# Patient Record
Sex: Female | Born: 1935 | Race: White | Hispanic: No | Smoking: Never smoker
Health system: Southern US, Community
[De-identification: ages and names within clinical notes are randomized; demographics above are authoritative.]

## PROBLEM LIST (undated history)

## (undated) DIAGNOSIS — I4891 Unspecified atrial fibrillation: Secondary | ICD-10-CM

## (undated) DIAGNOSIS — E785 Hyperlipidemia, unspecified: Secondary | ICD-10-CM

## (undated) DIAGNOSIS — R9430 Abnormal result of cardiovascular function study, unspecified: Secondary | ICD-10-CM

## (undated) DIAGNOSIS — I10 Essential (primary) hypertension: Secondary | ICD-10-CM

## (undated) DIAGNOSIS — C541 Malignant neoplasm of endometrium: Secondary | ICD-10-CM

## (undated) DIAGNOSIS — Z789 Other specified health status: Secondary | ICD-10-CM

## (undated) DIAGNOSIS — M1990 Unspecified osteoarthritis, unspecified site: Secondary | ICD-10-CM

## (undated) DIAGNOSIS — I2510 Atherosclerotic heart disease of native coronary artery without angina pectoris: Secondary | ICD-10-CM

## (undated) DIAGNOSIS — E119 Type 2 diabetes mellitus without complications: Secondary | ICD-10-CM

## (undated) DIAGNOSIS — N289 Disorder of kidney and ureter, unspecified: Secondary | ICD-10-CM

## (undated) DIAGNOSIS — K449 Diaphragmatic hernia without obstruction or gangrene: Secondary | ICD-10-CM

## (undated) DIAGNOSIS — Z951 Presence of aortocoronary bypass graft: Secondary | ICD-10-CM

## (undated) DIAGNOSIS — E11319 Type 2 diabetes mellitus with unspecified diabetic retinopathy without macular edema: Secondary | ICD-10-CM

## (undated) DIAGNOSIS — R0602 Shortness of breath: Secondary | ICD-10-CM

## (undated) DIAGNOSIS — H35039 Hypertensive retinopathy, unspecified eye: Secondary | ICD-10-CM

## (undated) DIAGNOSIS — IMO0001 Reserved for inherently not codable concepts without codable children: Secondary | ICD-10-CM

## (undated) HISTORY — DX: Type 2 diabetes mellitus without complications: E11.9

## (undated) HISTORY — DX: Other specified health status: Z78.9

## (undated) HISTORY — PX: EYE SURGERY: SHX253

## (undated) HISTORY — DX: Malignant neoplasm of endometrium: C54.1

## (undated) HISTORY — DX: Presence of aortocoronary bypass graft: Z95.1

## (undated) HISTORY — DX: Hyperlipidemia, unspecified: E78.5

## (undated) HISTORY — PX: BREAST MASS EXCISION: SHX1267

## (undated) HISTORY — DX: Diaphragmatic hernia without obstruction or gangrene: K44.9

## (undated) HISTORY — DX: Shortness of breath: R06.02

## (undated) HISTORY — PX: TUMOR REMOVAL: SHX12

## (undated) HISTORY — DX: Hypertensive retinopathy, unspecified eye: H35.039

## (undated) HISTORY — PX: CARDIAC CATHETERIZATION: SHX172

## (undated) HISTORY — PX: CATARACT EXTRACTION: SUR2

## (undated) HISTORY — DX: Abnormal result of cardiovascular function study, unspecified: R94.30

## (undated) HISTORY — DX: Unspecified atrial fibrillation: I48.91

## (undated) HISTORY — PX: TONSILECTOMY, ADENOIDECTOMY, BILATERAL MYRINGOTOMY AND TUBES: SHX2538

## (undated) HISTORY — PX: COLONOSCOPY: SHX174

## (undated) HISTORY — DX: Type 2 diabetes mellitus with unspecified diabetic retinopathy without macular edema: E11.319

## (undated) HISTORY — DX: Atherosclerotic heart disease of native coronary artery without angina pectoris: I25.10

## (undated) HISTORY — DX: Unspecified osteoarthritis, unspecified site: M19.90

## (undated) HISTORY — PX: APPENDECTOMY: SHX54

## (undated) HISTORY — DX: Disorder of kidney and ureter, unspecified: N28.9

## (undated) HISTORY — PX: PORTACATH PLACEMENT: SHX2246

## (undated) HISTORY — DX: Essential (primary) hypertension: I10

---
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
## 1996-04-07 HISTORY — PX: OTHER SURGICAL HISTORY: SHX169

## 2004-04-07 HISTORY — PX: CATARACT EXTRACTION: SUR2

## 2008-12-15 ENCOUNTER — Encounter: Payer: Self-pay | Admitting: Cardiology

## 2008-12-16 ENCOUNTER — Encounter: Payer: Self-pay | Admitting: Cardiology

## 2008-12-16 ENCOUNTER — Ambulatory Visit: Payer: Self-pay | Admitting: Cardiology

## 2008-12-17 ENCOUNTER — Encounter: Payer: Self-pay | Admitting: Cardiology

## 2008-12-18 ENCOUNTER — Ambulatory Visit: Payer: Self-pay | Admitting: Cardiothoracic Surgery

## 2008-12-18 ENCOUNTER — Ambulatory Visit: Payer: Self-pay | Admitting: Cardiology

## 2008-12-18 ENCOUNTER — Encounter: Payer: Self-pay | Admitting: Cardiology

## 2008-12-18 ENCOUNTER — Inpatient Hospital Stay (HOSPITAL_COMMUNITY): Admission: AD | Admit: 2008-12-18 | Discharge: 2008-12-26 | Payer: Self-pay | Admitting: Cardiovascular Disease

## 2008-12-19 ENCOUNTER — Encounter: Payer: Self-pay | Admitting: Cardiothoracic Surgery

## 2008-12-21 HISTORY — PX: CORONARY ARTERY BYPASS GRAFT: SHX141

## 2008-12-22 ENCOUNTER — Encounter: Payer: Self-pay | Admitting: Cardiology

## 2008-12-26 ENCOUNTER — Encounter: Payer: Self-pay | Admitting: Cardiology

## 2008-12-28 ENCOUNTER — Encounter: Payer: Self-pay | Admitting: Cardiology

## 2009-01-02 ENCOUNTER — Ambulatory Visit: Payer: Self-pay | Admitting: Cardiology

## 2009-01-02 LAB — CONVERTED CEMR LAB: POC INR: 2.5

## 2009-01-05 ENCOUNTER — Ambulatory Visit: Payer: Self-pay | Admitting: Cardiology

## 2009-01-05 LAB — CONVERTED CEMR LAB: POC INR: 3

## 2009-01-11 ENCOUNTER — Encounter: Payer: Self-pay | Admitting: Cardiology

## 2009-01-11 ENCOUNTER — Encounter: Admission: RE | Admit: 2009-01-11 | Discharge: 2009-01-11 | Payer: Self-pay | Admitting: Cardiothoracic Surgery

## 2009-01-11 ENCOUNTER — Ambulatory Visit: Payer: Self-pay | Admitting: Cardiothoracic Surgery

## 2009-01-12 ENCOUNTER — Ambulatory Visit: Payer: Self-pay | Admitting: Cardiology

## 2009-01-12 LAB — CONVERTED CEMR LAB: POC INR: 3.1

## 2009-01-23 ENCOUNTER — Ambulatory Visit: Payer: Self-pay | Admitting: Cardiology

## 2009-01-23 DIAGNOSIS — I10 Essential (primary) hypertension: Secondary | ICD-10-CM | POA: Insufficient documentation

## 2009-01-23 DIAGNOSIS — E119 Type 2 diabetes mellitus without complications: Secondary | ICD-10-CM | POA: Insufficient documentation

## 2009-01-23 DIAGNOSIS — E785 Hyperlipidemia, unspecified: Secondary | ICD-10-CM | POA: Insufficient documentation

## 2009-01-24 ENCOUNTER — Ambulatory Visit: Payer: Self-pay | Admitting: Cardiology

## 2009-01-26 ENCOUNTER — Ambulatory Visit: Payer: Self-pay | Admitting: Cardiology

## 2009-01-30 ENCOUNTER — Ambulatory Visit: Payer: Self-pay | Admitting: Cardiology

## 2009-02-06 ENCOUNTER — Ambulatory Visit: Payer: Self-pay | Admitting: Cardiology

## 2009-02-06 LAB — CONVERTED CEMR LAB: POC INR: 1.6

## 2009-02-16 ENCOUNTER — Ambulatory Visit: Payer: Self-pay | Admitting: Cardiology

## 2009-02-16 LAB — CONVERTED CEMR LAB: POC INR: 1.8

## 2009-02-23 ENCOUNTER — Ambulatory Visit: Payer: Self-pay | Admitting: Cardiology

## 2009-03-06 ENCOUNTER — Ambulatory Visit: Payer: Self-pay | Admitting: Cardiology

## 2009-03-23 ENCOUNTER — Ambulatory Visit: Payer: Self-pay | Admitting: Cardiology

## 2009-03-23 LAB — CONVERTED CEMR LAB: POC INR: 2

## 2009-03-27 ENCOUNTER — Ambulatory Visit: Payer: Self-pay | Admitting: Cardiology

## 2009-03-27 DIAGNOSIS — R0602 Shortness of breath: Secondary | ICD-10-CM | POA: Insufficient documentation

## 2009-03-29 ENCOUNTER — Encounter: Payer: Self-pay | Admitting: Cardiology

## 2009-04-13 ENCOUNTER — Ambulatory Visit: Payer: Self-pay | Admitting: Cardiology

## 2009-05-04 ENCOUNTER — Ambulatory Visit: Payer: Self-pay | Admitting: Cardiology

## 2009-05-04 LAB — CONVERTED CEMR LAB: POC INR: 2.5

## 2009-07-05 ENCOUNTER — Ambulatory Visit: Payer: Self-pay | Admitting: Cardiology

## 2009-07-09 ENCOUNTER — Encounter: Payer: Self-pay | Admitting: Cardiology

## 2010-01-14 ENCOUNTER — Ambulatory Visit: Payer: Self-pay | Admitting: Cardiology

## 2010-05-07 NOTE — Medication Information (Signed)
Summary: ccr-lr  Anticoagulant Therapy  Managed by: Lindsay Hey, RN PCP: [name] Supervising MD: Diona Browner MD, Remi Deter Indication 1: Atrial Fibrillation Indication 2: CAD Lab Used: [facility] [place] Site: [place] INR POC 2.4  Dietary changes: no    Health status changes: no    Bleeding/hemorrhagic complications: no    Recent/future hospitalizations: no    Any changes in medication regimen? no    Recent/future dental: no  Any missed doses?: no       Is patient compliant with meds? yes       Allergies: 1)  ! * Statins  Anticoagulation Management History:      The patient is taking warfarin and comes in today for a routine follow up visit.  Warfarin therapy is being given due to Atrial Fibrillation.  Positive risk factors for bleeding include an age of 2 years or older and presence of serious comorbidities.  Negative risk factors for bleeding include no history of CVA/TIA and no history of GI bleeding.  The bleeding index is 'intermediate risk'.  Positive CHADS2 values include History of HTN, Age > 75 years old, and History of Diabetes.  Negative CHADS2 values include History of CHF and Prior Stroke/CVA/TIA.  The start date was 12/19/2008.  Anticoagulation responsible provider: Diona Browner MD, Remi Deter.  INR POC: 2.4.  Cuvette Lot#: [id number].  Exp: 10/11.    Anticoagulation Management Assessment/Plan:      The patient's current anticoagulation dose is Warfarin sodium 5 mg tabs: take as directed per coumadin clinic.  The target INR is 2.0-3.0.  The next INR is due 05/11/2009.  Anticoagulation instructions were given to patient.  Results were reviewed/authorized by Lindsay Hey, RN.         Prior Anticoagulation Instructions: INR 2.0 Increase coumadin to 7.5mg  once daily except 10mg  on Fridays  Current Anticoagulation Instructions: INR 2.4 Continue coumadin 1 1/2 tablets once daily except 2 tablets on Fridays

## 2010-05-07 NOTE — Assessment & Plan Note (Signed)
Summary: 8WK F/U [place]   Visit Type:  Follow-up Primary Provider:  Dr. Regina Eck  CC:  fluid overload.  History of Present Illness:  The patient is seen back for followup of coronary artery disease, fluid overload, paroxysmal atrial fibrillation.  She is doing very well.  She had some fluid overload and we can push her diuretic dose to twice daily.  Labs were checked and her creatinine had gone up somewhat.  Based on this I cut her diuretic dose back to once daily.  She is doing well with this.  She takes an extra dose of diuretic on a rare basis if she has some swelling.  She has not had any chest pain or shortness of breath.  Preventive Screening-Counseling & Management  Alcohol-Tobacco     Smoking Status: never  Current Medications (verified): 1)  Norvasc 5 Mg Tabs (Amlodipine Besylate) .... Take 1 Tablet By Mouth Once A Day 2)  Aspirin 81 Mg Tbec (Aspirin) .... Take One Tablet By Mouth Daily 3)  Metoprolol Tartrate 25 Mg Tabs (Metoprolol Tartrate) .... 1/2 Tab Two Times A Day 4)  Calcium 500 Mg Tabs (Calcium Carbonate) .... Take 1 Tablet By Mouth Two Times A Day 5)  Glucophage 500 Mg Tabs (Metformin Hcl) .... Take 1 Tab By Mouth At Bedtime 6)  Glipizide 5 Mg Tabs (Glipizide) .... Take 1 Tablet By Mouth Once A Day 7)  Vitamin D 400 Unit  Tabs (Cholecalciferol) .... Take 1 Tablet By Mouth Once A Day 8)  Fish Oil 1000 Mg Caps (Omega-3 Fatty Acids) .... Take 1 Tablet By Mouth Once A Day 9)  Potassium Chloride Crys Cr 20 Meq Cr-Tabs (Potassium Chloride Crys Cr) .... Take One Tablet By Mouth Daily 10)  Furosemide 40 Mg Tabs (Furosemide) .... Take 1 Tablet By Mouth Once A Day 11)  Glucosamine 500 Mg Caps (Glucosamine Sulfate) .... Take 1 Tablet By Mouth Two Times A Day 12)  Red Yeast Rice Extract 600 Mg Caps (Red Yeast Rice Extract) .... 2 Tabs At Bedtime 13)  Flaxseed Oil 1200 Mg Caps (Flaxseed (Linseed)) .... Take 1 Tablet By Mouth Once A Day  Allergies: 1)  ! *  Statins  Comments:  Nurse/Medical Assistant: The patient's medications were reviewed with the patient and were updated in the Medication List. Pt brought a list of medications to office visit.  Cyril Loosen, RN, BSN (July 05, 2009 12:17 PM)  Past History:  Past Medical History: CAD CABG... December 21, 2008.Marland KitchenMarland KitchenDonata Clay.Marland Kitchen LIMA LAD, SVG posterior descending... SVG OM... Maze procedure.. ligation left atrial appendage  /  Coumadin stopped May 04, 2009 EF... 60%.. [facility]  [facility].... /  ???40% at a later date.. need to review???? LVH.. echo.. September, 2010 Atrial fibrillation... Maze procedure.. September, 2010...amiodarone and Coumadin for some period of time. /  Coumadin stopped May 04, 2009 Hypertension Hyperlipidemia Non-Insulin-dependent diabetes Severe Osteoarthritis Cholecystectomy Fluid overload Renal insufficiency.... mild.... with higher dose diuretic  Review of Systems       Patient denies fever, chills, headache, sweats, rash, change in vision, change in hearing, chest pain, cough, nausea vomiting, urinary symptoms, musculoskeletal problems.  All other systems are reviewed and are negative.  Vital Signs:  Patient profile:   77 year old female Height:      65 inches Weight:      178 pounds Pulse rate:   71 / minute BP sitting:   139 / 82  (left arm) Cuff size:   large  Vitals Entered By:  Cyril Loosen, RN, BSN (July 05, 2009 12:14 PM) CC: fluid overload Comments follow up visit   Physical Exam  General:  patient is quite stable. Head:  head is atraumatic. Eyes:  no xanthelasma. Neck:  no jugular venous distention. Chest Wall:  no chest wall tenderness. Lungs:  lungs are clear respiratory effort is nonlabored. Heart:  cardiac exam reveals S1-S2.  No clicks or significant murmurs. Abdomen:  abdomen is soft. Msk:  no musculoskeletal deformities. Extremities:  no peripheral edema. Skin:  no skin rashes. Psych:  patient is  oriented to person time and place.  Affect is normal.   Impression & Recommendations:  Problem # 1:  * FLUID OVERLOAD The patient's fluid status is now stable.  She required a higher dose of diuretic for a period of time and then we cut this back.  She is stable.  Problem # 2:  RENAL INSUFFICIENCY (ICD-588.9) The patient had mild increase in her creatinine was higher dose diuretics.  The diuretic dose was cut back. We will arrange for chemistry to be checked to be sure her renal function has returned to baseline.  Problem # 3:  EDEMA (ICD-782.3) She has no edema at this time.  Problem # 4:  * MAZE PROCEDURE The patient had a Maze procedure at the time of her surgery.  EKG is done today and reviewed by me.  She is normal sinus rhythm.  Problem # 5:  CAD (ICD-414.00)  Her updated medication list for this problem includes:    Norvasc 5 Mg Tabs (Amlodipine besylate) .Marland Kitchen... Take 1 tablet by mouth once a day    Aspirin 81 Mg Tbec (Aspirin) .Marland Kitchen... Take one tablet by mouth daily    Metoprolol Tartrate 25 Mg Tabs (Metoprolol tartrate) .Marland Kitchen... 1/2 tab two times a day Coronary disease is stable.  No further workup is needed at this time.  Problem # 6:  HYPERTENSION (ICD-401.9)  Her updated medication list for this problem includes:    Norvasc 5 Mg Tabs (Amlodipine besylate) .Marland Kitchen... Take 1 tablet by mouth once a day    Aspirin 81 Mg Tbec (Aspirin) .Marland Kitchen... Take one tablet by mouth daily    Metoprolol Tartrate 25 Mg Tabs (Metoprolol tartrate) .Marland Kitchen... 1/2 tab two times a day    Furosemide 40 Mg Tabs (Furosemide) .Marland Kitchen... Take 1 tablet by mouth once a day Blood pressure is under good control.  No change in therapy.  Other Orders: EKG w/ Interpretation (93000)  Patient Instructions: 1)  Your physician wants you to follow-up in: 6 months. You will receive a reminder letter in the mail one-two months in advance. If you don't receive a letter, please call our office to schedule the follow-up appointment. 2)   Your physician recommends that you continue on your current medications as directed. Please refer to the Current Medication list given to you today.  Appended Document: Orders Update    Clinical Lists Changes  Orders: Added new Test order of T-Basic Metabolic Panel [phone]) - Signed

## 2010-05-07 NOTE — Medication Information (Signed)
Summary: ccr-lr  Anticoagulant Therapy  Managed by: Inactive PCP: Dr. Regina Eck Supervising MD: Myrtis Ser MD, Tinnie Gens Indication 1: Atrial Fibrillation Indication 2: CAD Lab Used: [facility] [place] Site: [place] INR POC 2.5  Dietary changes: no    Health status changes: no    Bleeding/hemorrhagic complications: yes       Details: has developed vaginal bleeding  Recent/future hospitalizations: no    Any changes in medication regimen? no    Recent/future dental: no  Any missed doses?: no       Is patient compliant with meds? yes      Comments: Pt saw Dr Myrtis Ser today.  He stopped her coumadin and started her on ASA.  Allergies: 1)  ! * Statins  Anticoagulation Management History:      The patient is taking warfarin and comes in today for a routine follow up visit.  Anticoagulation is being administered due to Atrial Fibrillation.  Positive risk factors for bleeding include an age of 14 years or older and presence of serious comorbidities.  Negative risk factors for bleeding include no history of CVA/TIA and no history of GI bleeding.  The bleeding index is 'intermediate risk'.  Positive CHADS2 values include History of HTN, Age > 27 years old, and History of Diabetes.  Negative CHADS2 values include History of CHF and Prior Stroke/CVA/TIA.  The start date was 12/19/2008.  Anticoagulation responsible provider: Myrtis Ser MD, Tinnie Gens.  INR POC: 2.5.  Cuvette Lot#: [id number].  Exp: 10/11.    Anticoagulation Management Assessment/Plan:      The patient's current anticoagulation dose is Warfarin sodium 5 mg tabs: take as directed per coumadin clinic.  The target INR is 2.0-3.0.  The next INR is due 05/11/2009.  Anticoagulation instructions were given to patient.  Results were reviewed/authorized by Inactive.  She was notified by Vashti Hey RN.         Prior Anticoagulation Instructions: INR 2.4 Continue coumadin 1 1/2 tablets once daily except 2 tablets on Fridays  Current  Anticoagulation Instructions: INR 2.5 Coumadin discontinued per Dr Myrtis Ser

## 2010-05-07 NOTE — Assessment & Plan Note (Signed)
Summary: 6 mo fu   Visit Type:  Follow-up Primary Nassim Cosma:  Dr. Regina Eck  CC:  CAD.  History of Present Illness: Patient is seen for followup of coronary artery disease.  I saw her last July 05, 2009.  She is doing well.  Her fluid status has been stable.  She is not having any chest pain or shortness of breath.  He is going about full activities.  Preventive Screening-Counseling & Management  Alcohol-Tobacco     Smoking Status: never  Current Medications (verified): 1)  Norvasc 5 Mg Tabs (Amlodipine Besylate) .... Take 1 Tablet By Mouth Once A Day 2)  Aspirin 81 Mg Tbec (Aspirin) .... Take One Tablet By Mouth Daily 3)  Metoprolol Tartrate 25 Mg Tabs (Metoprolol Tartrate) .... 1/2 Tab Two Times A Day 4)  Calcium 500 Mg Tabs (Calcium Carbonate) .... Take 1 Tablet By Mouth Two Times A Day 5)  Glucophage 500 Mg Tabs (Metformin Hcl) .... Take 1 Tab By Mouth At Bedtime 6)  Glipizide 5 Mg Tabs (Glipizide) .... Take 1 Tablet By Mouth Once A Day 7)  Vitamin D 400 Unit  Tabs (Cholecalciferol) .... Take 1 Tablet By Mouth Once A Day 8)  Fish Oil 1000 Mg Caps (Omega-3 Fatty Acids) .... Take 1 Tablet By Mouth Once A Day 9)  Potassium Chloride Crys Cr 20 Meq Cr-Tabs (Potassium Chloride Crys Cr) .... Take One Tablet By Mouth Daily 10)  Furosemide 40 Mg Tabs (Furosemide) .... Take 1 Tablet By Mouth Once A Day 11)  Glucosamine 500 Mg Caps (Glucosamine Sulfate) .... Take 1 Tablet By Mouth Two Times A Day 12)  Red Yeast Rice Extract 600 Mg Caps (Red Yeast Rice Extract) .... 2 Tabs At Bedtime 13)  Flaxseed Oil 1200 Mg Caps (Flaxseed (Linseed)) .... Take 1 Tablet By Mouth Once A Day  Allergies (verified): 1)  ! * Statins  Comments:  Nurse/Medical Assistant: The patient's medication list and allergies were reviewed with the patient and were updated in the Medication and Allergy Lists.  Past History:  Past Medical History: CAD CABG... December 21, 2008.Marland KitchenMarland KitchenDonata Clay.Marland Kitchen LIMA LAD, SVG  posterior descending... SVG OM... Maze procedure.. ligation left atrial appendage  /  Coumadin stopped May 04, 2009 EF... 60%.. [facility]  December 18, 2008.... /  ???40% at a later date.. need to review???? LVH.. echo.. September, 2010 Atrial fibrillation... Maze procedure.. September, 2010...amiodarone and Coumadin for some period of time. /  Coumadin stopped May 04, 2009 Hypertension Hyperlipidemia Non-Insulin-dependent diabetes Severe Osteoarthritis Cholecystectomy Fluid overload Renal insufficiency.... mild.... with higher dose diuretic  Review of Systems       Patient denies fever, chills, headache, sweats, rash, change in vision, change in hearing, chest pain, cough, nausea vomiting, urinary symptoms.  All of the systems are reviewed and are negative  Vital Signs:  Patient profile:   77 year old female Height:      65 inches Weight:      179 pounds BMI:     29.89 Pulse rate:   71 / minute BP sitting:   145 / 83  (left arm) Cuff size:   large  Vitals Entered By: Carlye Grippe (January 14, 2010 10:15 AM)  Nutrition Counseling: Patient's BMI is greater than 25 and therefore counseled on weight management options.  Physical Exam  General:  patient is stable today. Eyes:  no xanthelasma. Neck:  no jugular venous distention Lungs:  lungs are clear.  Respiratory effort is not labored. Heart:  cardiac exam reveals an  S1-S2.  No clicks significant murmurs. Abdomen:  abdomen is soft. Extremities:  no peripheral edema. Psych:  patient is oriented to person time and place.  Affect is normal.   Impression & Recommendations:  Problem # 1:  * FLUID OVERLOAD Volume status stable.  No change in therapy.  Problem # 2:  RENAL INSUFFICIENCY (ICD-588.9) We checked a chemistry lab after her last visit.  BUN was 22 and creatinine 0.93.  No further workup needed  Problem # 3:  SHORTNESS OF BREATH (ICD-786.05)  Her updated medication list for this problem includes:     Norvasc 5 Mg Tabs (Amlodipine besylate) .Marland Kitchen... Take 1 tablet by mouth once a day    Aspirin 81 Mg Tbec (Aspirin) .Marland Kitchen... Take one tablet by mouth daily    Metoprolol Tartrate 25 Mg Tabs (Metoprolol tartrate) .Marland Kitchen... 1/2 tab two times a day    Furosemide 40 Mg Tabs (Furosemide) .Marland Kitchen... Take 1 tablet by mouth once a day She is not having any shortness of breath.  Problem # 4:  CAD (ICD-414.00)  Her updated medication list for this problem includes:    Norvasc 5 Mg Tabs (Amlodipine besylate) .Marland Kitchen... Take 1 tablet by mouth once a day    Aspirin 81 Mg Tbec (Aspirin) .Marland Kitchen... Take one tablet by mouth daily    Metoprolol Tartrate 25 Mg Tabs (Metoprolol tartrate) .Marland Kitchen... 1/2 tab two times a day Coronary disease is stable.  No change in therapy.  EKG is done today and reviewed by me.  There is normal sinus rhythm.  No significant abnormalities.  Problem # 5:  ATRIAL FIBRILLATION (ICD-427.31)  Her updated medication list for this problem includes:    Aspirin 81 Mg Tbec (Aspirin) .Marland Kitchen... Take one tablet by mouth daily    Metoprolol Tartrate 25 Mg Tabs (Metoprolol tartrate) .Marland Kitchen... 1/2 tab two times a day  Orders: EKG w/ Interpretation (93000) EKG reveals normal sinus rhythm after her maze procedure.  No further workup.  Patient Instructions: 1)  Your physician wants you to follow-up in: 1 year. You will receive a reminder letter in the mail one-two months in advance. If you don't receive a letter, please call our office to schedule the follow-up appointment. 2)  Your physician recommends that you continue on your current medications as directed. Please refer to the Current Medication list given to you today.

## 2010-05-07 NOTE — Assessment & Plan Note (Signed)
Summary: 3-4 WK FU -SRS   Visit Type:  Follow-up Primary Provider:  Dr. Regina Eck  CC:  CAD /  atrial fibrillation.  History of Present Illness: The patient is seen for followup of coronary artery disease and atrial fibrillation.  She is doing very well.  We have had her on aspirin and Coumadin for both coronary disease and atrial fibrillation.  She's had some vaginal spotting.  I have reviewed all of the data.  The patient had a Maze procedure.  Her atrial fibrillation had only been seen just prior to her need for CABG.  She does not have palpitations.  She is held sinus rhythm since June, 2010.  Her left atrial appendage was ligated.  Therefore it is safe for her to stop her Coumadin and this will be done.  Preventive Screening-Counseling & Management  Alcohol-Tobacco     Smoking Status: never  Current Medications (verified): 1)  Norvasc 5 Mg Tabs (Amlodipine Besylate) .... Take 1 Tablet By Mouth Once A Day 2)  Aspirin 81 Mg Tbec (Aspirin) .... Take One Tablet By Mouth Daily 3)  Metoprolol Tartrate 25 Mg Tabs (Metoprolol Tartrate) .... 1/2 Tab Two Times A Day 4)  Calcium 500 Mg Tabs (Calcium Carbonate) .... Take 1 Tablet By Mouth Two Times A Day 5)  Glucophage 500 Mg Tabs (Metformin Hcl) .... Take 1 Tab By Mouth At Bedtime 6)  Glipizide 5 Mg Tabs (Glipizide) .... Take 1 Tablet By Mouth Once A Day 7)  Vitamin D 400 Unit  Tabs (Cholecalciferol) .... Take 1 Tablet By Mouth Once A Day 8)  Fish Oil 1000 Mg Caps (Omega-3 Fatty Acids) .... Take 1 Tablet By Mouth Once A Day 9)  Potassium Chloride Crys Cr 20 Meq Cr-Tabs (Potassium Chloride Crys Cr) .... Take One Tablet By Mouth Daily 10)  Furosemide 40 Mg Tabs (Furosemide) .... Take 1 Tablet By Mouth Two Times A Day 11)  Glucosamine 500 Mg Caps (Glucosamine Sulfate) .... Take 1 Tablet By Mouth Two Times A Day 12)  Red Yeast Rice Extract 600 Mg Caps (Red Yeast Rice Extract) .... 2 Tabs At Bedtime 13)  Flaxseed Oil 1200 Mg Caps (Flaxseed  (Linseed)) .... Take 1 Tablet By Mouth Once A Day  Allergies (verified): 1)  ! * Statins  Comments:  Nurse/Medical Assistant: The patient's medications and allergies were reviewed with the patient and were updated in the Medication and Allergy Lists. List reviewed.  Past History:  Past Medical History: CAD CABG... December 21, 2008.Marland KitchenMarland KitchenDonata Clay.Marland Kitchen LIMA LAD, SVG posterior descending... SVG OM... Maze procedure.. ligation left atrial appendage  /  Coumadin [facility] May 04, 2009 EF... 60[facility].. [facility]  [facility].... /  ???40% at a later date.. need to review???? LVH.. echo.. September, 2010 Atrial fibrillation... Maze procedure.. September, 2010...amiodarone and Coumadin for some period of time. /  Coumadin stopped May 04, 2009 Hypertension Hyperlipidemia Non-Insulin-dependent diabetes Severe Osteoarthritis Cholecystectomy  Review of Systems       Patient denies fever, chills, headache, sweats, rash, change in vision, change in hearing, chest pain, shortness of breath, nausea vomiting, urinary symptoms.  All the systems are reviewed and are negative.  Vital Signs:  Patient profile:   77 year old female Height:      65 inches Weight:      181 pounds Pulse rate:   72 / minute BP sitting:   130 / 62  (left arm) Cuff size:   large  Vitals Entered By: Carlye Grippe (May 04, 2009 2:26  PM) CC: CAD /  atrial fibrillation   Physical Exam  General:  patient is quite stable in general. Head:  head is atraumatic. Eyes:  no xanthelasma. Neck:  no jugular venous distention. Chest Wall:  no chest wall tenderness. Lungs:  lungs are clear.  Respiratory effort is nonlabored. Heart:  cardiac exam reveals S1 and S2.  There is a very soft systolic murmur. Abdomen:  abdomen is soft. Msk:  no musculoskeletal deformities. Extremities:  no peripheral edema. Skin:  no skin rashes. Psych:  patient is oriented to person time and place.  Affect is normal.   Impression &  Recommendations:  Problem # 1:  SHORTNESS OF BREATH (ICD-786.05)  Her updated medication list for this problem includes:    Norvasc 5 Mg Tabs (Amlodipine besylate) .Marland Kitchen... Take 1 tablet by mouth once a day    Aspirin 81 Mg Tbec (Aspirin) .Marland Kitchen... Take one tablet by mouth daily    Metoprolol Tartrate 25 Mg Tabs (Metoprolol tartrate) .Marland Kitchen... 1/2 tab two times a day    Furosemide 40 Mg Tabs (Furosemide) .Marland Kitchen... Take 1 tablet by mouth two times a day  Patient shortness of breath and edema are improved.  She is on a higher dose of Lasix since her last visit.  We will recheck chemistry today be sure that this is stable.  If so she'll be kept on his dose of diuretic.  I will see her back in 8 weeks for followup.  Problem # 2:  * MAZE PROCEDURE The patient had a Maze procedure.  She is holding sinus rhythm.  She had ligation of her left atrial appendage.  These are all the reasons why her Coumadin can now be stopped.  In addition she needs to remain on aspirin for her coronary disease.  In addition she's having some vaginal spotting.  Therefore Coumadin is to be stopped.  I'll see her for early followup to be sure that she is holding sinus rhythm.  EKG is done today reviewed by me.  There is normal sinus rhythm.  Problem # 3:  CAD (ICD-414.00)  The following medications were removed from the medication list:    Warfarin Sodium 5 Mg Tabs (Warfarin sodium) .Marland Kitchen... Take as directed per coumadin clinic Her updated medication list for this problem includes:    Norvasc 5 Mg Tabs (Amlodipine besylate) .Marland Kitchen... Take 1 tablet by mouth once a day    Aspirin 81 Mg Tbec (Aspirin) .Marland Kitchen... Take one tablet by mouth daily    Metoprolol Tartrate 25 Mg Tabs (Metoprolol tartrate) .Marland Kitchen... 1/2 tab two times a day Coronary disease is stable.  She will continue her aspirin.  Problem # 4:  HYPERTENSION (ICD-401.9)  Her updated medication list for this problem includes:    Norvasc 5 Mg Tabs (Amlodipine besylate) .Marland Kitchen... Take 1 tablet by  mouth once a day    Aspirin 81 Mg Tbec (Aspirin) .Marland Kitchen... Take one tablet by mouth daily    Metoprolol Tartrate 25 Mg Tabs (Metoprolol tartrate) .Marland Kitchen... 1/2 tab two times a day    Furosemide 40 Mg Tabs (Furosemide) .Marland Kitchen... Take 1 tablet by mouth two times a day Blood pressure is well controlled today.  No change in therapy.  Orders: T-Basic Metabolic Panel [phone])  Other Orders: EKG w/ Interpretation (93000)  Patient Instructions: 1)  Your physician has recommended you make the following change in your medication: STOP COUMADIN, REMAIN ON ASPIRIN. 2)  Your physician recommends that you return for lab work [id number] [facility] AT [facility].  3)  Your physician recommends that you schedule a follow-up appointment in: 07/05/09 @12 :30PM with Dr. Myrtis Ser.

## 2010-06-06 HISTORY — PX: COLONOSCOPY: SHX174

## 2010-06-20 ENCOUNTER — Ambulatory Visit: Payer: Medicare Other | Attending: Gynecologic Oncology | Admitting: Gynecologic Oncology

## 2010-06-20 DIAGNOSIS — I10 Essential (primary) hypertension: Secondary | ICD-10-CM | POA: Insufficient documentation

## 2010-06-20 DIAGNOSIS — E119 Type 2 diabetes mellitus without complications: Secondary | ICD-10-CM | POA: Insufficient documentation

## 2010-06-20 DIAGNOSIS — Z79899 Other long term (current) drug therapy: Secondary | ICD-10-CM | POA: Insufficient documentation

## 2010-06-20 DIAGNOSIS — C549 Malignant neoplasm of corpus uteri, unspecified: Secondary | ICD-10-CM | POA: Insufficient documentation

## 2010-06-20 DIAGNOSIS — I252 Old myocardial infarction: Secondary | ICD-10-CM | POA: Insufficient documentation

## 2010-06-28 NOTE — Consult Note (Addendum)
[name], [name]              ACCOUNT NO.:  [id number]  MEDICAL RECORD NO.:  [id number]           PATIENT TYPE:  LOCATION:                                 FACILITY:  PHYSICIAN:  Laurette Schimke, MD          DATE OF BIRTH:  DATE OF CONSULTATION:  06/20/2010 DATE OF DISCHARGE:                                CONSULTATION   REASON FOR CONSULTATION:  Consult was requested by Dr. Elza Rafter for management of endometrial cancer.  HISTORY OF PRESENT ILLNESS:  This is a 77 year old gravida 1, para 1, last normal menstrual period in the late 1940s.  She noted onset of rectal bleeding approximately 1 month ago.  The bleeding increased in intensity and she presented to the emergency room.  She was referred for colonoscopy and the colonoscopy was not feasible because of evidence of acute diverticulitis and as such she was treated with a 14-day course of antibiotics.  A CT scan of the abdomen and pelvis was obtained on June 06, 2010, and this confirmed the presence of diverticulitis.  In addition, irregular thickening was appreciated within the fundus of the uterus and the patient was referred for additional evaluation.  An endometrial biopsy and a pelvic ultrasound was performed and was notable for uterus, measuring 7.9 cm in greatest dimension.  The endometrial lining was noted to be thickened with internal color flow and measured 1.6 cm.  The adnexa were within normal limits.  As such, she underwent endometrial biopsy, which returned evidence of a grade 2 endometrial adenocarcinoma.  [name] denies any vaginal bleeding.  She states that she has a great appetite.  She has lost 10 pounds because of intentional dietary changes.  She denies diarrhea or constipation.  She denies pelvic pain, cramping, or changes in urinary habits.  PAST MEDICAL HISTORY: 1. Non-insulin dependent diabetes for 10 years' duration. 2. Myocardial infarction in September 2010. 3. Hypertension for 20  years.  PAST SURGICAL HISTORY:  Three vessel bypass, cardiac bypass in September 2010, appendectomy 6 years ago, laparoscopic cholecystectomy in 1998, non-thyroid related mass removed from the neck in 1982, and left benign breast mass removed in 1984.  MEDICATIONS: 1. Norvasc 5 mg daily. 2. Aspirin 81 mg daily. 3. Metoprolol 25 mg half tablet twice daily. 4. Calcium 500 mg twice daily. 5. Glucophage 500 mg twice daily. 6. Glipizide 5 mg twice daily. 7. Vitamin D 400 units daily. 8. Fish oil 1000 mg daily. 9. Potassium chloride 20 mEq daily. 10.Furosemide 40 mg daily. 11.Glucosamine sulfate 500 mg twice daily. 12.Red yeast rice extract 600 mg twice daily. 13.Flaxseed oil 1 tablet daily.  ALLERGIES:  STATINS, which cause achiness and agitation.  SOCIAL HISTORY:  The patient has been widowed for 3 years.  She denies tobacco or alcohol use.  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  FAMILY HISTORY:  Mother diagnosed with breast cancer at age of 39 and a father with metastatic cancer of unknown primary.  REVIEW OF SYSTEMS:  Ten-point review of systems is negative except asnoted above in the history and physical.  PHYSICAL EXAMINATION:  GENERAL:  A well-developed, very pleasant female in no acute distress. VITAL SIGNS:  Height 5 feet 5 inches, weight 166 pounds, blood pressure 152/70, pulse 76. CHEST:  Clear to auscultation. HEART:  Regular rate and rhythm. ABDOMEN:  Soft, obese.  Mid-sternotomy incision and appendectomy incisions appreciated. PELVIC:  Normal external genitalia, Bartholin, urethral, and Skene .  4 cm cervix, soft without any lesions.  Uterus is mobile.  No nodularity noted within the cul-de-sac.  No lesions visible in the vagina. RECTAL:  Good anal sphincter tone without any masses.  IMPRESSION:  Grade 2 endometrial carcinoma.  An ultrasound demonstrates the uterus is approximately 8 cm.  A CT scan of the abdomen and pelvis is unremarkable for  retroperitoneal adenopathy.  The patient was given the option between laparotomy versus robotic-assisted laparoscopic hysterectomy, bilateral salpingo-oophorectomy, bilateral pelvic lymph node dissection with possible periaortic lymph node dissection.  The patient opted for the noninvasive route.  She is aware that there is a possibility of conversion.  The risks and benefits of the procedure were discussed with her in the presence of her sister.  All of their questions were answered to their satisfaction.  [name] is aware that the procedure will be by Dr. Rockney Ghee on November 28, 2009.  Prior to completion of this procedure, cardiac clearance will be obtained from Dr. Willa Rough.     Laurette Schimke, MD     WB/MEDQ  D:  06/20/2010  T:  06/21/2010  Job:  [id number]  cc:   Luis Abed, MD, [facility] [address]  [address] [place] [address]  [name] Fax: [phone]  Dr. Elza Rafter  Electronically Signed by Laurette Schimke MD on 06/24/2010 10:32:48 AM

## 2010-07-07 HISTORY — PX: ABDOMINAL HYSTERECTOMY: SHX81

## 2010-07-08 ENCOUNTER — Telehealth: Payer: Self-pay | Admitting: *Deleted

## 2010-07-08 NOTE — Telephone Encounter (Signed)
Received a call from nurse with [facility] regarding pt. She states pt is having surgery on April 24th with Dr. Cleda Mccreedy. This will be general surgery w/general anes. Pt is having robotic assisted hysterectomy/BSO, bilateral pelvic lymph node dissection w/possible periaortic lymph node dissection d/t endometrial cancer. She is asking for surgical clearance for this patient. She states they do not have a surgical clearance form to fax. She states their phone number is [phone] and fax number is [phone]. Consultation note from Dr. Nelly Rout is available in E-chart and has been printed for Dr. Henrietta Hoover review. Nurse is aware we will ask Dr. Myrtis Ser to review when he is in [place] on 4/3.

## 2010-07-09 NOTE — Telephone Encounter (Signed)
Patient is cleared from the cardiac viewpoint for GYN surgery

## 2010-07-09 NOTE — Telephone Encounter (Signed)
[facility].

## 2010-07-12 LAB — CROSSMATCH
ABO/RH(D): O POS
Antibody Screen: NEGATIVE

## 2010-07-12 LAB — BASIC METABOLIC PANEL
BUN: 14 mg/dL (ref 6–23)
BUN: 17 mg/dL (ref 6–23)
BUN: 9 mg/dL (ref 6–23)
BUN: 14 mg/dL (ref 6–23)
BUN: 16 mg/dL (ref 6–23)
BUN: 17 mg/dL (ref 6–23)
CO2: 24 mEq/L (ref 19–32)
CO2: 24 mEq/L (ref 19–32)
CO2: 25 mEq/L (ref 19–32)
CO2: 23 mEq/L (ref 19–32)
CO2: 26 mEq/L (ref 19–32)
Calcium: 7.9 mg/dL — ABNORMAL LOW (ref 8.4–10.5)
Calcium: 8.4 mg/dL (ref 8.4–10.5)
Calcium: 8.8 mg/dL (ref 8.4–10.5)
Calcium: 8.7 mg/dL (ref 8.4–10.5)
Calcium: 8.8 mg/dL (ref 8.4–10.5)
Chloride: 101 mEq/L (ref 96–112)
Chloride: 102 mEq/L (ref 96–112)
Chloride: 105 mEq/L (ref 96–112)
Chloride: 103 mEq/L (ref 96–112)
Chloride: 91 mEq/L — ABNORMAL LOW (ref 96–112)
Chloride: 91 mEq/L — ABNORMAL LOW (ref 96–112)
Creatinine, Ser: 0.93 mg/dL (ref 0.4–1.2)
Creatinine, Ser: 1.11 mg/dL (ref 0.4–1.2)
Creatinine, Ser: 0.88 mg/dL (ref 0.4–1.2)
Creatinine, Ser: 0.97 mg/dL (ref 0.4–1.2)
Creatinine, Ser: 1.04 mg/dL (ref 0.4–1.2)
GFR calc Af Amer: 58 mL/min — ABNORMAL LOW (ref >60)
GFR calc Af Amer: >60 mL/min (ref >60)
GFR calc Af Amer: >60 mL/min (ref >60)
GFR calc Af Amer: >60 mL/min (ref >60)
GFR calc Af Amer: >60 mL/min (ref >60)
GFR calc non Af Amer: 48 mL/min — ABNORMAL LOW (ref >60)
GFR calc non Af Amer: 59 mL/min — ABNORMAL LOW (ref >60)
GFR calc non Af Amer: >60 mL/min (ref >60)
GFR calc non Af Amer: >60 mL/min (ref >60)
Glucose, Bld: 131 mg/dL — ABNORMAL HIGH (ref 70–99)
Glucose, Bld: 151 mg/dL — ABNORMAL HIGH (ref 70–99)
Glucose, Bld: 132 mg/dL — ABNORMAL HIGH (ref 70–99)
Glucose, Bld: 158 mg/dL — ABNORMAL HIGH (ref 70–99)
Glucose, Bld: 171 mg/dL — ABNORMAL HIGH (ref 70–99)
Potassium: 3.8 mEq/L (ref 3.5–5.1)
Potassium: 4 mEq/L (ref 3.5–5.1)
Potassium: 3.4 mEq/L — ABNORMAL LOW (ref 3.5–5.1)
Potassium: 4.2 mEq/L (ref 3.5–5.1)
Sodium: 132 mEq/L — ABNORMAL LOW (ref 135–145)
Sodium: 137 mEq/L (ref 135–145)
Sodium: 134 mEq/L — ABNORMAL LOW (ref 135–145)

## 2010-07-12 LAB — POCT I-STAT 4, (NA,K, GLUC, HGB,HCT)
Glucose, Bld: 114 mg/dL — ABNORMAL HIGH (ref 70–99)
Glucose, Bld: 142 mg/dL — ABNORMAL HIGH (ref 70–99)
Glucose, Bld: 153 mg/dL — ABNORMAL HIGH (ref 70–99)
HCT: 20 % — ABNORMAL LOW (ref 36.0–46.0)
HCT: 25 % — ABNORMAL LOW (ref 36.0–46.0)
HCT: 26 % — ABNORMAL LOW (ref 36.0–46.0)
HCT: 37 % (ref 36.0–46.0)
Hemoglobin: 12.6 g/dL (ref 12.0–15.0)
Hemoglobin: 6.8 g/dL — CL (ref 12.0–15.0)
Potassium: 3.7 mEq/L (ref 3.5–5.1)
Potassium: 4.3 mEq/L (ref 3.5–5.1)
Sodium: 137 mEq/L (ref 135–145)

## 2010-07-12 LAB — CBC
HCT: 31.3 % — ABNORMAL LOW (ref 36.0–46.0)
HCT: 35.1 % — ABNORMAL LOW (ref 36.0–46.0)
HCT: 35.8 % — ABNORMAL LOW (ref 36.0–46.0)
HCT: 38.5 % (ref 36.0–46.0)
HCT: 38.8 % (ref 36.0–46.0)
HCT: 40.3 % (ref 36.0–46.0)
Hemoglobin: 10.8 g/dL — ABNORMAL LOW (ref 12.0–15.0)
Hemoglobin: 12 g/dL (ref 12.0–15.0)
Hemoglobin: 12.3 g/dL (ref 12.0–15.0)
Hemoglobin: 13.1 g/dL (ref 12.0–15.0)
Hemoglobin: 13.2 g/dL (ref 12.0–15.0)
Hemoglobin: 13.2 g/dL (ref 12.0–15.0)
Hemoglobin: 13.9 g/dL (ref 12.0–15.0)
Hemoglobin: 14.3 g/dL (ref 12.0–15.0)
MCHC: 34 g/dL (ref 30.0–36.0)
MCHC: 34.1 g/dL (ref 30.0–36.0)
MCHC: 34.2 g/dL (ref 30.0–36.0)
MCHC: 34.2 g/dL (ref 30.0–36.0)
MCHC: 34.4 g/dL (ref 30.0–36.0)
MCHC: 34.4 g/dL (ref 30.0–36.0)
MCHC: 34.4 g/dL (ref 30.0–36.0)
MCHC: 34.6 g/dL (ref 30.0–36.0)
MCV: 91.5 fL (ref 78.0–100.0)
MCV: 91.6 fL (ref 78.0–100.0)
MCV: 91.8 fL (ref 78.0–100.0)
MCV: 91.9 fL (ref 78.0–100.0)
MCV: 92.1 fL (ref 78.0–100.0)
MCV: 92.8 fL (ref 78.0–100.0)
MCV: 93.6 fL (ref 78.0–100.0)
Platelets: 113 10*3/uL — ABNORMAL LOW (ref 150–400)
Platelets: 135 10*3/uL — ABNORMAL LOW (ref 150–400)
Platelets: 137 10*3/uL — ABNORMAL LOW (ref 150–400)
Platelets: 139 10*3/uL — ABNORMAL LOW (ref 150–400)
Platelets: 149 10*3/uL — ABNORMAL LOW (ref 150–400)
Platelets: 171 10*3/uL (ref 150–400)
RBC: 3.41 MIL/uL — ABNORMAL LOW (ref 3.87–5.11)
RBC: 3.81 MIL/uL — ABNORMAL LOW (ref 3.87–5.11)
RBC: 3.9 MIL/uL (ref 3.87–5.11)
RBC: 4.17 MIL/uL (ref 3.87–5.11)
RBC: 4.2 MIL/uL (ref 3.87–5.11)
RBC: 4.24 MIL/uL (ref 3.87–5.11)
RBC: 4.34 MIL/uL (ref 3.87–5.11)
RBC: 4.42 MIL/uL (ref 3.87–5.11)
RDW: 14.1 % (ref 11.5–15.5)
RDW: 14.1 % (ref 11.5–15.5)
RDW: 14.4 % (ref 11.5–15.5)
RDW: 14.5 % (ref 11.5–15.5)
RDW: 14.6 % (ref 11.5–15.5)
RDW: 13.4 % (ref 11.5–15.5)
RDW: 13.5 % (ref 11.5–15.5)
WBC: 11.9 10*3/uL — ABNORMAL HIGH (ref 4.0–10.5)
WBC: 13.6 10*3/uL — ABNORMAL HIGH (ref 4.0–10.5)
WBC: 13.6 10*3/uL — ABNORMAL HIGH (ref 4.0–10.5)
WBC: 14.3 10*3/uL — ABNORMAL HIGH (ref 4.0–10.5)
WBC: 14.6 10*3/uL — ABNORMAL HIGH (ref 4.0–10.5)
WBC: 7.4 10*3/uL (ref 4.0–10.5)
WBC: 6.7 10*3/uL (ref 4.0–10.5)

## 2010-07-12 LAB — HEPATIC FUNCTION PANEL
Albumin: 3.3 g/dL — ABNORMAL LOW (ref 3.5–5.2)
Alkaline Phosphatase: 71 U/L (ref 39–117)
Bilirubin, Direct: 0.1 mg/dL (ref 0.0–0.3)
Total Bilirubin: 0.7 mg/dL (ref 0.3–1.2)

## 2010-07-12 LAB — POCT I-STAT 3, ART BLOOD GAS (G3+)
Acid-base deficit: 1 mmol/L (ref 0.0–2.0)
Acid-base deficit: 3 mmol/L — ABNORMAL HIGH (ref 0.0–2.0)
Acid-base deficit: 3 mmol/L — ABNORMAL HIGH (ref 0.0–2.0)
Bicarbonate: 20 mEq/L (ref 20.0–24.0)
Bicarbonate: 21.7 mEq/L (ref 20.0–24.0)
O2 Saturation: 100 %
O2 Saturation: 100 %
O2 Saturation: 97 %
TCO2: 21 mmol/L (ref 0–100)
TCO2: 22 mmol/L (ref 0–100)
TCO2: 24 mmol/L (ref 0–100)
TCO2: 25 mmol/L (ref 0–100)
pCO2 arterial: 27.6 mmHg — ABNORMAL LOW (ref 35.0–45.0)
pCO2 arterial: 33.6 mmHg — ABNORMAL LOW (ref 35.0–45.0)
pCO2 arterial: 34.8 mmHg — ABNORMAL LOW (ref 35.0–45.0)
pCO2 arterial: 50 mmHg — ABNORMAL HIGH (ref 35.0–45.0)
pH, Arterial: 7.403 — ABNORMAL HIGH (ref 7.350–7.400)
pH, Arterial: 7.403 — ABNORMAL HIGH (ref 7.350–7.400)
pO2, Arterial: 406 mmHg — ABNORMAL HIGH (ref 80.0–100.0)
pO2, Arterial: 83 mmHg (ref 80.0–100.0)
pO2, Arterial: 98 mmHg (ref 80.0–100.0)

## 2010-07-12 LAB — GLUCOSE, CAPILLARY
Glucose-Capillary: 104 mg/dL — ABNORMAL HIGH (ref 70–99)
Glucose-Capillary: 104 mg/dL — ABNORMAL HIGH (ref 70–99)
Glucose-Capillary: 110 mg/dL — ABNORMAL HIGH (ref 70–99)
Glucose-Capillary: 111 mg/dL — ABNORMAL HIGH (ref 70–99)
Glucose-Capillary: 112 mg/dL — ABNORMAL HIGH (ref 70–99)
Glucose-Capillary: 112 mg/dL — ABNORMAL HIGH (ref 70–99)
Glucose-Capillary: 117 mg/dL — ABNORMAL HIGH (ref 70–99)
Glucose-Capillary: 120 mg/dL — ABNORMAL HIGH (ref 70–99)
Glucose-Capillary: 184 mg/dL — ABNORMAL HIGH (ref 70–99)
Glucose-Capillary: 211 mg/dL — ABNORMAL HIGH (ref 70–99)
Glucose-Capillary: 65 mg/dL — ABNORMAL LOW (ref 70–99)
Glucose-Capillary: 68 mg/dL — ABNORMAL LOW (ref 70–99)
Glucose-Capillary: 80 mg/dL (ref 70–99)
Glucose-Capillary: 93 mg/dL (ref 70–99)
Glucose-Capillary: 98 mg/dL (ref 70–99)
Glucose-Capillary: 113 mg/dL — ABNORMAL HIGH (ref 70–99)
Glucose-Capillary: 116 mg/dL — ABNORMAL HIGH (ref 70–99)
Glucose-Capillary: 150 mg/dL — ABNORMAL HIGH (ref 70–99)
Glucose-Capillary: 158 mg/dL — ABNORMAL HIGH (ref 70–99)
Glucose-Capillary: 172 mg/dL — ABNORMAL HIGH (ref 70–99)

## 2010-07-12 LAB — URINALYSIS, MICROSCOPIC ONLY
Bilirubin Urine: NEGATIVE
Glucose, UA: NEGATIVE mg/dL
Ketones, ur: NEGATIVE mg/dL
Nitrite: NEGATIVE
Protein, ur: NEGATIVE mg/dL
Specific Gravity, Urine: 1.007 (ref 1.005–1.030)
Urobilinogen, UA: 0.2 mg/dL (ref 0.0–1.0)
pH: 6.5 (ref 5.0–8.0)

## 2010-07-12 LAB — POCT I-STAT, CHEM 8
BUN: 14 mg/dL (ref 6–23)
BUN: 9 mg/dL (ref 6–23)
Calcium, Ion: 1.15 mmol/L (ref 1.12–1.32)
Chloride: 100 mEq/L (ref 96–112)
Glucose, Bld: 128 mg/dL — ABNORMAL HIGH (ref 70–99)
Hemoglobin: 12.6 g/dL (ref 12.0–15.0)
Sodium: 134 mEq/L — ABNORMAL LOW (ref 135–145)
TCO2: 20 mmol/L (ref 0–100)

## 2010-07-12 LAB — CREATININE, SERUM
Creatinine, Ser: 0.83 mg/dL (ref 0.4–1.2)
Creatinine, Ser: 1.21 mg/dL — ABNORMAL HIGH (ref 0.4–1.2)
GFR calc Af Amer: 52 mL/min — ABNORMAL LOW (ref >60)
GFR calc Af Amer: >60 mL/min (ref >60)
GFR calc non Af Amer: 43 mL/min — ABNORMAL LOW (ref >60)
GFR calc non Af Amer: >60 mL/min (ref >60)

## 2010-07-12 LAB — PREPARE PLATELETS

## 2010-07-12 LAB — POCT I-STAT GLUCOSE
Glucose, Bld: 142 mg/dL — ABNORMAL HIGH (ref 70–99)
Operator id: 3342

## 2010-07-12 LAB — APTT: aPTT: 32 seconds (ref 24–37)

## 2010-07-12 LAB — PROTIME-INR
INR: 1.1 (ref 0.00–1.49)
INR: 1.1 (ref 0.00–1.49)
INR: 1.3 (ref 0.00–1.49)
INR: 1.4 (ref 0.00–1.49)
INR: 1.5 (ref 0.00–1.49)
Prothrombin Time: 14.4 seconds (ref 11.6–15.2)
Prothrombin Time: 14.4 seconds (ref 11.6–15.2)
Prothrombin Time: 15.9 seconds — ABNORMAL HIGH (ref 11.6–15.2)
Prothrombin Time: 16.7 seconds — ABNORMAL HIGH (ref 11.6–15.2)
Prothrombin Time: 17.7 seconds — ABNORMAL HIGH (ref 11.6–15.2)

## 2010-07-12 LAB — MRSA PCR SCREENING: MRSA by PCR: NEGATIVE

## 2010-07-12 LAB — HEPARIN LEVEL (UNFRACTIONATED)
Heparin Unfractionated: 0.63 IU/mL (ref 0.30–0.70)
Heparin Unfractionated: 0.46 IU/mL (ref 0.30–0.70)
Heparin Unfractionated: 0.87 IU/mL — ABNORMAL HIGH (ref 0.30–0.70)

## 2010-07-12 LAB — MAGNESIUM
Magnesium: 2.1 mg/dL (ref 1.5–2.5)
Magnesium: 2.2 mg/dL (ref 1.5–2.5)
Magnesium: 2.3 mg/dL (ref 1.5–2.5)

## 2010-07-12 LAB — POCT I-STAT 3, VENOUS BLOOD GAS (G3P V)
Acid-base deficit: 4 mmol/L — ABNORMAL HIGH (ref 0.0–2.0)
Bicarbonate: 21.8 mEq/L (ref 20.0–24.0)
O2 Saturation: 74 %
pCO2, Ven: 41.2 mmHg — ABNORMAL LOW (ref 45.0–50.0)
pO2, Ven: 42 mmHg (ref 30.0–45.0)

## 2010-07-12 LAB — HEMOGLOBIN A1C
Hgb A1c MFr Bld: 7.5 % — ABNORMAL HIGH (ref 4.6–6.1)
Mean Plasma Glucose: 169 mg/dL

## 2010-07-12 LAB — PLATELET COUNT: Platelets: 100 10*3/uL — ABNORMAL LOW (ref 150–400)

## 2010-07-12 LAB — HEMOGLOBIN AND HEMATOCRIT, BLOOD
HCT: 26.9 % — ABNORMAL LOW (ref 36.0–46.0)
Hemoglobin: 9.4 g/dL — ABNORMAL LOW (ref 12.0–15.0)

## 2010-07-26 ENCOUNTER — Encounter (HOSPITAL_COMMUNITY): Payer: Medicare Other

## 2010-07-26 ENCOUNTER — Other Ambulatory Visit: Payer: Self-pay | Admitting: Gynecologic Oncology

## 2010-07-26 ENCOUNTER — Other Ambulatory Visit: Payer: Self-pay | Admitting: Obstetrics & Gynecology

## 2010-07-26 ENCOUNTER — Ambulatory Visit (HOSPITAL_COMMUNITY)
Admission: RE | Admit: 2010-07-26 | Discharge: 2010-07-26 | Disposition: A | Discharge status: Home / Self Care | Payer: Medicare Other | Source: Ambulatory Visit | Attending: Obstetrics & Gynecology | Admitting: Obstetrics & Gynecology

## 2010-07-26 DIAGNOSIS — C541 Malignant neoplasm of endometrium: Secondary | ICD-10-CM

## 2010-07-26 DIAGNOSIS — Z01818 Encounter for other preprocedural examination: Secondary | ICD-10-CM | POA: Insufficient documentation

## 2010-07-26 DIAGNOSIS — Z01812 Encounter for preprocedural laboratory examination: Secondary | ICD-10-CM | POA: Insufficient documentation

## 2010-07-26 DIAGNOSIS — I252 Old myocardial infarction: Secondary | ICD-10-CM

## 2010-07-26 DIAGNOSIS — I517 Cardiomegaly: Secondary | ICD-10-CM | POA: Insufficient documentation

## 2010-07-26 DIAGNOSIS — C549 Malignant neoplasm of corpus uteri, unspecified: Secondary | ICD-10-CM | POA: Insufficient documentation

## 2010-07-26 LAB — CBC
HCT: 35.3 % — ABNORMAL LOW (ref 36.0–46.0)
MCH: 28.4 pg (ref 26.0–34.0)
MCV: 83.5 fL (ref 78.0–100.0)
Platelets: 207 10*3/uL (ref 150–400)
RBC: 4.23 MIL/uL (ref 3.87–5.11)

## 2010-07-26 LAB — ABO/RH: ABO/RH(D): O POS

## 2010-07-26 LAB — COMPREHENSIVE METABOLIC PANEL
Albumin: 3.9 g/dL (ref 3.5–5.2)
BUN: 18 mg/dL (ref 6–23)
CO2: 27 mEq/L (ref 19–32)
Chloride: 102 mEq/L (ref 96–112)
Creatinine, Ser: 0.85 mg/dL (ref 0.4–1.2)
GFR calc non Af Amer: >60 mL/min (ref >60)
Glucose, Bld: 136 mg/dL — ABNORMAL HIGH (ref 70–99)
Total Bilirubin: 0.6 mg/dL (ref 0.3–1.2)

## 2010-07-26 LAB — DIFFERENTIAL
Eosinophils Absolute: 0.2 10*3/uL (ref 0.0–0.7)
Lymphs Abs: 1.3 10*3/uL (ref 0.7–4.0)
Monocytes Relative: 9 % (ref 3–12)
Neutrophils Relative %: 64 % (ref 43–77)

## 2010-07-26 LAB — TYPE AND SCREEN
ABO/RH(D): O POS
Antibody Screen: NEGATIVE

## 2010-07-30 ENCOUNTER — Other Ambulatory Visit: Payer: Self-pay | Admitting: Gynecologic Oncology

## 2010-07-30 ENCOUNTER — Ambulatory Visit (HOSPITAL_COMMUNITY)
Admission: RE | Admit: 2010-07-30 | Discharge: 2010-07-31 | Disposition: A | Discharge status: Home / Self Care | Payer: Medicare Other | Source: Ambulatory Visit | Attending: Obstetrics & Gynecology | Admitting: Obstetrics & Gynecology

## 2010-07-30 DIAGNOSIS — C775 Secondary and unspecified malignant neoplasm of intrapelvic lymph nodes: Secondary | ICD-10-CM | POA: Insufficient documentation

## 2010-07-30 DIAGNOSIS — C549 Malignant neoplasm of corpus uteri, unspecified: Secondary | ICD-10-CM | POA: Insufficient documentation

## 2010-07-30 DIAGNOSIS — E119 Type 2 diabetes mellitus without complications: Secondary | ICD-10-CM | POA: Insufficient documentation

## 2010-07-30 DIAGNOSIS — I2510 Atherosclerotic heart disease of native coronary artery without angina pectoris: Secondary | ICD-10-CM | POA: Insufficient documentation

## 2010-07-30 DIAGNOSIS — Z79899 Other long term (current) drug therapy: Secondary | ICD-10-CM | POA: Insufficient documentation

## 2010-07-30 DIAGNOSIS — I10 Essential (primary) hypertension: Secondary | ICD-10-CM | POA: Insufficient documentation

## 2010-07-30 DIAGNOSIS — Z01812 Encounter for preprocedural laboratory examination: Secondary | ICD-10-CM | POA: Insufficient documentation

## 2010-07-30 DIAGNOSIS — I252 Old myocardial infarction: Secondary | ICD-10-CM | POA: Insufficient documentation

## 2010-07-30 LAB — GLUCOSE, CAPILLARY: Glucose-Capillary: 229 mg/dL — ABNORMAL HIGH (ref 70–99)

## 2010-07-31 LAB — BASIC METABOLIC PANEL
Chloride: 101 mEq/L (ref 96–112)
GFR calc Af Amer: >60 mL/min (ref >60)
GFR calc non Af Amer: 60 mL/min — ABNORMAL LOW (ref >60)
Potassium: 4.5 mEq/L (ref 3.5–5.1)

## 2010-07-31 LAB — CBC
Platelets: 172 10*3/uL (ref 150–400)
RBC: 3.78 MIL/uL — ABNORMAL LOW (ref 3.87–5.11)
RDW: 12.4 % (ref 11.5–15.5)
WBC: 8.5 10*3/uL (ref 4.0–10.5)

## 2010-08-05 NOTE — Op Note (Signed)
NAMEAARIANA, [name]              ACCOUNT NO.:  [id number]  MEDICAL RECORD NO.:  [id number]           PATIENT TYPE:  O  LOCATION:  1527                         FACILITY:  [facility]  PHYSICIAN:  Malijah Lietz A. Duard Brady, MD    DATE OF BIRTH:  04-12-1930  DATE OF PROCEDURE:  07/30/2010 DATE OF DISCHARGE:                              OPERATIVE REPORT   PREOPERATIVE DIAGNOSIS:  Grade 2 endometrioid adenocarcinoma.  POSTOPERATIVE DIAGNOSIS:  Grade 2 endometrioid adenocarcinoma.  PROCEDURES: 1. Total robotic hysterectomy. 2. Bilateral salpingo-oophorectomy. 3. Bilateral pelvic and paraortic lymph node dissection.  SURGEONS: 1. Paolo Okane A. Duard Brady, MD 2. Roseanna Rainbow, M.D.  ASSISTANT:  Telford Nab, RN  ANESTHESIA:  General.  ANESTHESIOLOGIST:  Joellen Jersey  ESTIMATED BLOOD LOSS:  25 mL.  IV FLUIDS:  2000 mL.  URINE OUTPUT:  500 mL.  COMPLICATIONS:  None.  SPECIMENS:  Washings of cervix, uterus, bilateral tubes and ovaries, bilateral pelvic and paraaortic lymph nodes.  OPERATIVE FINDINGS:  Included some adhesive disease of the rectosigmoid colon to the left adnexa and left pelvic sidewall.  Frozen section revealed a grade 2 endometrioid adenocarcinoma with greater than 50% myometrial invasion.  No pathologically enlarged lymph nodes were noted.  The patient was seen in the preop holding area and identified as self. Informed consent was signed on the chart.  Risks and benefits of the procedure were discussed with the patient and she wished to proceed.  DESCRIPTION OF PROCEDURE:  She was then taken to the operating room, placed in supine position where her arms were tucked to her sides with appropriate precautions while she was awake.  General anesthesia was then induced.  An OG tube was placed.  She was placed in the dorsal lithotomy position with appropriate precautions.  Time-out was performed to confirm the patient, the procedure, antibiotic and allergy status. The  abdomen was prepped with ChloraPrep and 3 minutes were awaited.  The perineum was prepped in usual fashion.  Foley catheter was inserted in the bladder sterile conditions.  Sterile speculum was placed into the vagina.  The cervix was identified, grasped with single-tooth tenaculum and dilated without difficulty.  The ZUMI with a medium colpotomizer was placed without any difficulty.  The pneumo-occluder balloon followed. After 3 minutes, the patient was then draped.  Time-out was again performed to confirm the patient, the procedure, antibiotic and allergy status.  A 2 cm below the left costal margin midclavicular line, a mark was made.  The area was infiltrated with 0.25% Marcaine and a 5-mm incision was made.  Using a 5-mm Optiview port, we identified intraperitoneal placement.  The abdomen was insufflated with CO2 gas. At this point and all points during the case, the patient's intra- abdominal pressure did not exceed 15 mmHg.  After insufflation, she was then placed in Trendelenburg position.  Port sites were marked with the umbilical camera port being approximately 24 cm above the pubic symphysis.  Bilateral 8-mm ports were marked 10 cm lateral to the midline camera port at approximately 10 to 15 degrees angle below.  The fourth on the left side was placed 2 cm above the ACIS.  All ports were placed directly.  There was some adhesive disease of the omentum to the anterior abdominal wall.  These were taken down using sharp dissection with pinpoint cautery prior to docking.  The small bowel was then folded upon itself and a Ray-Tec was placed.  The robot was then docked.  Our attention was first drawn to the paraaortic lymph nodes on the right side.  The peritoneum overlying the common on the right was incised extending superiorly towards the duodenum.  The ureter was identified in the retroperitoneum.  The fourth arm of the robot was used to deviate the ureter lateral and superiorly.   The nodal bundle extending over the common superior to above the IMA was taken down using sharp dissection. The nodal bundle encompassing the right side of the aorta to the vena cava was evacuated.  It was placed in EndoCatch bag and delivered through the 10/12 port.  Our attention was then drawn to the left -sided paraaortic lymph node dissection.  The peritoneal incision was extended. The ureter was identified in the retroperitoneum and the fourth arm was used to deviate the ureter superiorly and laterally.  The nodal bundle between the bifurcation and the IMA was taken down using dissection. There was small amount of oozing and a Ray-Tec was placed in the space.  The abdominal pelvic washings were then obtained.  The round ligament on the patient's right side was transected with monopolar cautery.  The anterior and posterior leaflets of the broad ligament were opened.  The ureter was identified.  The pararectal space was opened.  A window was made between the IP and the ureter.  The IP was coagulated and then transected.  We continued on the bladder flap and dissected the bladder to a level below the KOH ring.  The ureter and artery was skeletonized, bipolar cauterized and transected and a C-loop was created.  A similar procedure was performed on the patient's left side.  The pneumo-occluder balloon was then insufflated and the colpotomy was performed.  The uterus tubes and ovaries were delivered through the vagina and thepneumo-occluder  balloon replaced.  Paravesical space was opened on the patient's right side.  The nodal bundle extending from the distal common iliac on the right to the circumflex iliac vein inferiorly was taken down using sharp dissection. The genitofemoral nerve was identified.  The ureter at the whole time was deviated medially.  The obturator nerve was then identified and the nodal bundle superior to the obturator nerve was taken.  There was noted to be  adequate hemostasis.  The nodal bundle was then placed in EndoCatch bag.  Lymph node dissection on the left side was then performed in a similar fashion.  All nodal bundles were then delivered through the vagina, as was the Ray-Tec.  The pneumo-occluder balloon was replaced.  The vaginal cuff was closed using a running suture of 0 Vicryl on CT-1. The abdomen and pelvis were copiously irrigated.  All pedicle sites and lymph node basins were inspected and they were noted to be hemostatic. The instruments were removed under visualization as were the ports.  The CO2 was removed from the patient's abdomen.  The robot was then undocked.  The 10/12 ports, deep sutures of 0 Vicryl on UR-6 were used to close the subcu of the 10/12 ports.  The skin was closed using 4-0 Vicryl.  Steri-Strips were placed as were sterile dressings.  The pneumo-occluder was then removed.  The vagina was swabbed and noted be  hemostatic.  There was a slight abrasion on the posterior aspect of the vagina that was hemostatic.  There was no active bleeding.  The patient tolerated the procedure well, was taken to the recovery room stable condition.  All instrument, needle and Ray-Tec counts were correct x2.     Steele Ledonne A. Duard Brady, MD     PAG/MEDQ  D:  07/30/2010  T:  07/30/2010  Job:  [id number]  cc:   Luis Abed, MD, [facility]: [facility] [address]  [address] [place] [address]  [name] Fax: [phone]  Telford Nab, R.N. [phone] [address] [place], [place] [address]  Dr. Maxcine Ham, [place]  Roseanna Rainbow, M.D. Fax: [phone]  Electronically Signed by Cleda Mccreedy MD on 08/05/2010 11:19:14 AM

## 2010-08-20 NOTE — Assessment & Plan Note (Signed)
OFFICE VISIT   [name], [name]  DOB:  [birth year]                                        January 11, 2009  CHART #:  [id number]   CURRENT PROBLEMS:  1. Status post CABG x3, September 16 for unstable angina, atrial      fibrillation, and severe three-vessel coronary artery disease.  2. Old inferior wall myocardial infarction.  3. Hypertension.  4. Non-insulin-dependent diabetes.  5. Severe osteoarthritis.   HISTORY OF PRESENT ILLNESS:  The patient is a very nice 77 year old  Caucasian female returns for her first office visit after multivessel  bypass grafting last month for severe coronary artery disease and  unstable angina.  She presented with unstable angina, rapid atrial  fibrillation, and mildly elevated cardiac enzymes.  A 2-D echo and  subsequent CAT showed EF of 40% with severe three-vessel disease.  She  underwent left IMA grafting to LAD and vein grafts to the posterior  descending and obtuse marginal as well as a maze procedure.  She did  well postoperatively remained in a sinus rhythm.  She was started on  Coumadin and amiodarone was also discharged home on metoprolol and  glipizide and metformin.  She has done well at home without recurrent  angina or atrial fibrillation.  The surgical incision is healing well  and she is gradually increasing her activity level.   PHYSICAL EXAMINATION:  Vital Signs:  Blood pressure 147/72, pulse 58,  respirations 18, saturation 97% on room air.  General:  She is alert and  pleasant.  Breath sounds are clear and equal.  The sternum is stable  well healed.  CARDIAC:  Rhythm is regular without gallop or rub.  The  leg incisions are healing well.  There is no peripheral edema.   PA and lateral chest x-ray taken today shows some mild basilar  atelectasis, but no significant effusion, mild cardiomegaly, sternal  wires are all intact.   IMPRESSION AND PLAN:  The patient has done well now 1 month after  surgery  and she is ready to start driving and light activity.  Her blood  pressure is high and rather than increase the beta-blocker with her  bradycardia, she will start Norvasc 5 mg a day until reviewed by her  cardiologist, Dr. Myrtis Ser.  I asked her to reduce the amiodarone dose to  once a day.  She will return to the care Dr. Myrtis Ser, and I will be happy  to see her back for any further surgical issues.   Kerin Perna, M.D.  Electronically Signed   PV/MEDQ  D:  01/11/2009  T:  01/12/2009  Job:  [id number]   cc:   Luis Abed, MD, [facility]  Erasmo Downer, MD

## 2010-08-21 ENCOUNTER — Ambulatory Visit: Payer: Medicare Other | Attending: Gynecologic Oncology | Admitting: Gynecologic Oncology

## 2010-08-21 DIAGNOSIS — Z90710 Acquired absence of both cervix and uterus: Secondary | ICD-10-CM | POA: Insufficient documentation

## 2010-08-21 DIAGNOSIS — Z9079 Acquired absence of other genital organ(s): Secondary | ICD-10-CM | POA: Insufficient documentation

## 2010-08-21 DIAGNOSIS — C549 Malignant neoplasm of corpus uteri, unspecified: Secondary | ICD-10-CM | POA: Insufficient documentation

## 2010-08-22 NOTE — Consult Note (Signed)
NAMEDORELLA, LASTER              ACCOUNT NO.:  [id number]  MEDICAL RECORD NO.:  [id number]           PATIENT TYPE:  O  LOCATION:  GYN                          FACILITY[facility]  [facility]  PHYSICIAN:  Sigurd Pugh A. Duard Brady, MD    DATE OF BIRTH:  Jun 26, 1930  DATE OF CONSULTATION:  08/21/2010 DATE OF DISCHARGE:                                CONSULTATION   HISTORY:  Ms. Hendley is a very pleasant 77 year old who was initially seen by Dr. Nelly Rout at the request of Dr. Baldemar Lenis.  She had an episode of bleeding per rectum.  She had a colonoscopy that was not feasible because of evidence of acute diverticulitis and she was treated with 14 days of antibiotics.  CT scan of the abdomen and pelvis in March of 2012 revealed diverticulitis.  In addition, there was an irregular thickening within the fundus of the uterus.  She underwent endometrial biopsy that revealed a grade 2 endometrioid adenocarcinoma and was subsequently referred to [place].  She then underwent on July 30, 2010, a total robotic hysterectomy, BSO, bilateral pelvic and periaortic lymph node dissection.  Operative findings included adhesive disease of the rectosigmoid colon to the left adnexa and left pelvic sidewall.  Frozen section revealed a grade 2 endometrioid adenocarcinoma with greater than 50% myometrial invasion.  There were no pathologically enlarged lymph nodes noted.  Pathology returned showing that the washings were negative.  She had a grade 2 endometrioid adenocarcinoma with 0.9 out of 1.2 cm of myometrial invasion.  She had evidence of lymphovascular space involvement.  0 out of 9 right periaortic, 0 out of 2 left periaortic, 0 out of 7 right pelvic lymph nodes were all negative.  However, 1 out of 5 left pelvic lymph nodes were positive for a total of 1 out of 23 positive.  She comes in today for brief postoperative check, but also to discuss her pathology and treatment planning.  She states she felt sluggish for about 10 days  after her surgery and little bit sore, she only Percocet twice, shiny bleeding.  She is very anxious to get back on her lawnmower, which is a riding lawnmower and get back to her usual activities.  Her energy level is excellent.  Her appetite is improved.  She denies any vaginal bleeding, change in bowel or bladder habits.  PHYSICAL EXAMINATION:  VITAL SIGNS:  Weight 171 pounds, blood pressure 130/62, pulse 68. GENERAL:  Well-nourished, well-developed female, in no acute distress. ABDOMEN:  She has well-healed surgical incisions.  Abdomen is soft and nontender.  ASSESSMENT:  77 year old with a stage 3C1 endometrioid adenocarcinoma.  PLAN:  I spent 20 minutes face-to-face time with the patient and her sister today discussing pathology and treatment options.  We discussed standard sandwich chemotherapy and radiation, which would involve 3 cycles of paclitaxel and carboplatin followed by pelvic radiation followed by an additional 3 cycles of paclitaxel and carboplatin.  We also discussed GOG protocol 258.  We discussed both arms.  The first arm would be a chemorad to the pelvic region with cisplatinchemosensitization followed by 4 cycles of paclitaxel and carboplatin versus just 6 standard cycles of  paclitaxel and carboplatin.  After our discussion and answering their questions, they feel that they would like to proceed with GOG 258 as that can be done between [facility] and [place]. They very much would like to have treatment with Dr. Mariel Sleet who they have heard very good things about.  Mary from the protocol office came down to discuss this with them.  They will finalize treatment decisions. 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  We have tentatively scheduled her to see Dr. Mariel Sleet on September 10, 2010, at 2 o'clock.  She knows to call me with any questions prior to that and she understands that  while we offer protocol therapy, there is no pressure for her to participate in the protocol, that it is merely one that she is eligible for.     Aijalon Demuro A. Duard Brady, MD     PAG/MEDQ  D:  08/21/2010  T:  08/21/2010  Job:  [id number]  cc:   Luis Abed, MD, [facility] [address]  [address] [place] [address]  [name] Fax: [phone]  Telford Nab, R.N. [phone] [address] [place], [place] [address]  Ladona Horns. Mariel Sleet, MD Fax: [phone]  Eliseo Squires, MD  Electronically Signed by Cleda Mccreedy MD on 08/22/2010 02:29:47 PM

## 2010-09-04 ENCOUNTER — Ambulatory Visit: Payer: Medicare Other | Attending: Gynecologic Oncology | Admitting: Gynecologic Oncology

## 2010-09-04 DIAGNOSIS — Z9079 Acquired absence of other genital organ(s): Secondary | ICD-10-CM | POA: Insufficient documentation

## 2010-09-04 DIAGNOSIS — Z90710 Acquired absence of both cervix and uterus: Secondary | ICD-10-CM | POA: Insufficient documentation

## 2010-09-04 DIAGNOSIS — C549 Malignant neoplasm of corpus uteri, unspecified: Secondary | ICD-10-CM | POA: Insufficient documentation

## 2010-09-05 NOTE — Consult Note (Signed)
NAMEVAEDA, WESTALL              ACCOUNT NO.:  [id number]  MEDICAL RECORD NO.:  [id number]           PATIENT TYPE:  O  LOCATION:  GYN                          FACILITY:  [facility]  PHYSICIAN:  Helmer Dull A. Duard Brady, MD    DATE OF BIRTH:  [birth year]  DATE OF CONSULTATION:  09/04/2010 DATE OF DISCHARGE:                                CONSULTATION   HISTORY:  Ms. Livingston is a  very pleasant 77 year old who had an episode of bleeding per rectum.  Colonoscopy was not feasible because of acute diverticulitis and she was treated with 14 days of antibiotics.  A CT scan of the abdomen and pelvis in March 2012, revealed diverticulitis as well as an irregular thickening within the fundus of the uterus.  She underwent an endometrial biopsy revealed a grade 2 endometrioid adenocarcinoma was subsequently referred to [place].  She underwent an uncomplicated robotic hysterectomy, BSO, bilateral pelvic, and paraaortic lymph node dissection in April, 2012.  Operative findings included adhesive disease of the rectosigmoid colon to the left adnexa and on the left pelvic sidewall.  Final pathology revealed a grade 2 endometrioid adenocarcinoma with 0.9 out of 1.2 cm of myometrial invasion.  She had evidence of lymphovascular space involvement.  0/9 right periaortic, 0/2 left periaortic, 0/7 right pelvic nodes were all negative.  However, 1/5 left pelvic lymph nodes was positive for a total of 1 out of 23.  I had a lengthy discussion with her and her sister regarding this on May 16, that she has stage III C1 endometrioid adenocarcinoma and that we would recommend protocol GOG258.  She has met with a protocol nurse and has read the protocol extensively, but after lengthy discussion and contemplation she wishes to be treated with a sandwich chemotherapy off GOG protocol.  She is overall doing fairly well.  She states her energy is back up to baseline.  She denies any vaginal bleeding, change in bowel or bladder  habits.  PHYSICAL EXAMINATION:  VITAL SIGNS:  Weight 169 pounds, blood pressure 136/74, pulse 68, temperature 97.9. GENERAL:  Well-nourished, well-developed female, in no acute distress. ABDOMEN:  Shows well-healed surgical incisions.  Small suture was cut from the midline camera port incision.  Abdomen is soft and nontender. There are no palpable hernias.  Groins are negative for adenopathy. EXTREMITIES:  There is no edema. PELVIC:  External genitalia is within normal limits, though atrophic. The vagina is markedly atrophic.  The vaginal cuff is visualized.  This is healing well.  Bimanual examination reveals no masses at the cuff. There is no tenderness.  ASSESSMENT:  A 77 year old stage III C1 endometrioid adenocarcinoma grade 2 who declined GOG protocol 258 and be treated off protocol with 3 cycles of paclitaxel and carboplatin followed by pelvic radiation plus or minus para-aortic radiation at the discretion of Dr. Roselind Messier followed by an additional 3 cycles of chemotherapy with paclitaxel and carboplatin.  She does have an appointment with Dr. Mariel Sleet on June 5.  She has already been seen by Dr. Roselind Messier regarding radiation therapy.  She knows that we would like to see her after her 3rd cycle of chemotherapy.  Isaak Delmundo A. Duard Brady, MD     PAG/MEDQ  D:  09/04/2010  T:  09/05/2010  Job:  [id number]  cc:   Telford Nab, R.N. [address] [place], [place] [address]  Ladona Horns. Mariel Sleet, MD Fax: [phone]  Luis Abed, MD, [facility] [address]  [address] [place] [address]  Billie Lade, Ph.D., M.D. Fax: [phone]  Dr. Jalene Mullet Fax: [phone]  Electronically Signed by Cleda Mccreedy MD on 09/05/2010 09:40:57 AM

## 2010-09-10 ENCOUNTER — Other Ambulatory Visit (HOSPITAL_COMMUNITY): Payer: Self-pay | Admitting: Oncology

## 2010-09-10 ENCOUNTER — Encounter (HOSPITAL_COMMUNITY): Payer: Medicare Other | Attending: Oncology

## 2010-09-10 DIAGNOSIS — C55 Malignant neoplasm of uterus, part unspecified: Secondary | ICD-10-CM

## 2010-09-10 DIAGNOSIS — E119 Type 2 diabetes mellitus without complications: Secondary | ICD-10-CM | POA: Insufficient documentation

## 2010-09-10 DIAGNOSIS — C775 Secondary and unspecified malignant neoplasm of intrapelvic lymph nodes: Secondary | ICD-10-CM | POA: Insufficient documentation

## 2010-09-10 DIAGNOSIS — I4891 Unspecified atrial fibrillation: Secondary | ICD-10-CM | POA: Insufficient documentation

## 2010-09-10 DIAGNOSIS — Z79899 Other long term (current) drug therapy: Secondary | ICD-10-CM | POA: Insufficient documentation

## 2010-09-10 DIAGNOSIS — C549 Malignant neoplasm of corpus uteri, unspecified: Secondary | ICD-10-CM | POA: Insufficient documentation

## 2010-09-10 LAB — COMPREHENSIVE METABOLIC PANEL
AST: 22 U/L (ref 0–37)
Albumin: 3.9 g/dL (ref 3.5–5.2)
CO2: 29 mEq/L (ref 19–32)
Calcium: 10.5 mg/dL (ref 8.4–10.5)
Creatinine, Ser: 0.93 mg/dL (ref 0.4–1.2)
GFR calc Af Amer: >60 mL/min (ref >60)
GFR calc non Af Amer: 58 mL/min — ABNORMAL LOW (ref >60)
Total Protein: 7.8 g/dL (ref 6.0–8.3)

## 2010-09-10 LAB — DIFFERENTIAL
Basophils Relative: 0 % (ref 0–1)
Eosinophils Absolute: 0.3 10*3/uL (ref 0.0–0.7)
Eosinophils Absolute: 0.3 10*3/uL (ref 0.0–0.7)
Lymphocytes Relative: 27 % (ref 12–46)
Lymphs Abs: 1.5 10*3/uL (ref 0.7–4.0)
Lymphs Abs: 1.5 10*3/uL (ref 0.7–4.0)
Monocytes Relative: 8 % (ref 3–12)
Monocytes Relative: 8 % (ref 3–12)
Neutro Abs: 3.3 10*3/uL (ref 1.7–7.7)
Neutrophils Relative %: 60 % (ref 43–77)
Neutrophils Relative %: 60 % (ref 43–77)

## 2010-09-10 LAB — CBC
Hemoglobin: 11.4 g/dL — ABNORMAL LOW (ref 12.0–15.0)
MCH: 27.5 pg (ref 26.0–34.0)
MCV: 82.9 fL (ref 78.0–100.0)
Platelets: 220 10*3/uL (ref 150–400)
RBC: 4.14 MIL/uL (ref 3.87–5.11)
WBC: 5.5 10*3/uL (ref 4.0–10.5)

## 2010-09-10 LAB — APTT: aPTT: 32 seconds (ref 24–37)

## 2010-09-10 LAB — PROTIME-INR
INR: 0.91 (ref 0.00–1.49)
Prothrombin Time: 12.5 seconds (ref 11.6–15.2)

## 2010-09-17 NOTE — H&P (Addendum)
  NAME:  [name], [name] NO.:  [id number]  MEDICAL RECORD NO.:  [id number]  LOCATION:                                 FACILITY:  PHYSICIAN:  Dalia Heading, M.D.  DATE OF BIRTH:  1930/07/01  DATE OF ADMISSION: DATE OF DISCHARGE:  LH                             HISTORY & PHYSICAL   CHIEF COMPLAINT:  Endometrial carcinoma, need for central venous access for chemotherapy.  HISTORY OF PRESENT ILLNESS:  The patient is a 77 year old white female who presents for Port-A-Cath insertion.  She is about to undergo chemotherapy for treatment of endometrial cancer.  PAST MEDICAL HISTORY:  Diabetes, heart murmur, high blood pressure.  PAST SURGICAL HISTORY:  Appendectomy, hysterectomy, CABG, breast biopsy, throat tumor excision of a benign lesion.  CURRENT MEDICATIONS:  Norvasc, baby aspirin, metoprolol, Glucophage, glipizide, potassium supplements, Lasix, vitamin D.  ALLERGIES:  STATIN MEDICATIONS.  REVIEW OF SYSTEMS:  The patient denies drinking or smoking.  She denies any other cardiopulmonary difficulties or bleeding disorders.  FAMILY MEDICAL HISTORY:  Noncontributory.  PHYSICAL EXAMINATION:  GENERAL:  The patient is a well-developed, well- nourished white female in no acute distress. VITAL SIGNS:  She is afebrile and vital signs are stable. HEENT:  Unremarkable. NECK:  Supple without lymphadenopathy. HEART:  Regular rate and rhythm with a small murmur, systolic ejection, noted. LUNGS:  Clear to auscultation with equal breath sounds bilaterally. ABDOMEN:  Benign.  IMPRESSION:  Endometrial carcinoma, need for Port-A-Cath insertion.  PLAN:  The patient is scheduled for Port-A-Cath insertion on September 20, 2010.  The risks and benefits of the procedure including bleeding, infection, pneumothorax were fully explained to the patient, gave informed consent.     Dalia Heading, M.D.     MAJ/MEDQ  D:  09/12/2010  T:  09/13/2010  Job:  [id number]  cc:   Short  Stay at [facility].  Ladona Horns. Mariel Sleet, MD Fax: [phone]  [name], MD Fax: [phone]  Electronically Signed by Franky Macho M.D. on 09/30/2010 07:28:15 AM

## 2010-09-18 ENCOUNTER — Encounter (HOSPITAL_COMMUNITY): Payer: Medicare Other

## 2010-09-18 ENCOUNTER — Other Ambulatory Visit: Payer: Self-pay | Admitting: Infectious Diseases

## 2010-09-19 ENCOUNTER — Inpatient Hospital Stay (HOSPITAL_COMMUNITY): Payer: Medicare Other

## 2010-09-20 ENCOUNTER — Ambulatory Visit (HOSPITAL_COMMUNITY)
Admission: RE | Admit: 2010-09-20 | Discharge: 2010-09-20 | Disposition: A | Discharge status: Home / Self Care | Payer: Medicare Other | Source: Ambulatory Visit | Attending: General Surgery | Admitting: General Surgery

## 2010-09-20 ENCOUNTER — Ambulatory Visit (HOSPITAL_COMMUNITY): Payer: Medicare Other | Admitting: Oncology

## 2010-09-20 ENCOUNTER — Ambulatory Visit (HOSPITAL_COMMUNITY): Payer: Medicare Other

## 2010-09-20 DIAGNOSIS — Z951 Presence of aortocoronary bypass graft: Secondary | ICD-10-CM | POA: Insufficient documentation

## 2010-09-20 DIAGNOSIS — E119 Type 2 diabetes mellitus without complications: Secondary | ICD-10-CM | POA: Insufficient documentation

## 2010-09-20 DIAGNOSIS — Z7982 Long term (current) use of aspirin: Secondary | ICD-10-CM | POA: Insufficient documentation

## 2010-09-20 DIAGNOSIS — Z01818 Encounter for other preprocedural examination: Secondary | ICD-10-CM | POA: Insufficient documentation

## 2010-09-20 DIAGNOSIS — Z79899 Other long term (current) drug therapy: Secondary | ICD-10-CM | POA: Insufficient documentation

## 2010-09-20 DIAGNOSIS — Z01812 Encounter for preprocedural laboratory examination: Secondary | ICD-10-CM | POA: Insufficient documentation

## 2010-09-20 DIAGNOSIS — I10 Essential (primary) hypertension: Secondary | ICD-10-CM | POA: Insufficient documentation

## 2010-09-20 DIAGNOSIS — C549 Malignant neoplasm of corpus uteri, unspecified: Secondary | ICD-10-CM | POA: Insufficient documentation

## 2010-09-20 LAB — GLUCOSE, CAPILLARY: Glucose-Capillary: 117 mg/dL — ABNORMAL HIGH (ref 70–99)

## 2010-09-23 ENCOUNTER — Other Ambulatory Visit (HOSPITAL_COMMUNITY): Payer: Self-pay | Admitting: Oncology

## 2010-09-23 ENCOUNTER — Encounter (HOSPITAL_COMMUNITY): Payer: Medicare Other | Admitting: Oncology

## 2010-09-23 ENCOUNTER — Inpatient Hospital Stay (HOSPITAL_COMMUNITY): Payer: Medicare Other

## 2010-09-23 ENCOUNTER — Encounter (HOSPITAL_COMMUNITY): Payer: Medicare Other

## 2010-09-23 DIAGNOSIS — C380 Malignant neoplasm of heart: Secondary | ICD-10-CM

## 2010-09-23 DIAGNOSIS — C541 Malignant neoplasm of endometrium: Secondary | ICD-10-CM

## 2010-09-24 ENCOUNTER — Encounter (HOSPITAL_COMMUNITY): Payer: Self-pay | Admitting: *Deleted

## 2010-09-25 ENCOUNTER — Encounter (HOSPITAL_COMMUNITY): Payer: Self-pay | Admitting: Oncology

## 2010-09-25 ENCOUNTER — Inpatient Hospital Stay (HOSPITAL_COMMUNITY): Payer: Medicare Other

## 2010-09-25 ENCOUNTER — Other Ambulatory Visit (HOSPITAL_COMMUNITY): Payer: Self-pay | Admitting: Oncology

## 2010-09-25 DIAGNOSIS — C541 Malignant neoplasm of endometrium: Secondary | ICD-10-CM

## 2010-09-25 HISTORY — DX: Malignant neoplasm of endometrium: C54.1

## 2010-09-26 ENCOUNTER — Inpatient Hospital Stay (HOSPITAL_COMMUNITY): Payer: Medicare Other

## 2010-09-26 ENCOUNTER — Other Ambulatory Visit (HOSPITAL_COMMUNITY): Payer: Self-pay | Admitting: Oncology

## 2010-09-26 ENCOUNTER — Encounter (HOSPITAL_COMMUNITY): Payer: Medicare Other

## 2010-09-26 DIAGNOSIS — C549 Malignant neoplasm of corpus uteri, unspecified: Secondary | ICD-10-CM

## 2010-09-26 DIAGNOSIS — Z5111 Encounter for antineoplastic chemotherapy: Secondary | ICD-10-CM

## 2010-09-26 LAB — CBC
HCT: 34.4 % — ABNORMAL LOW (ref 36.0–46.0)
Hemoglobin: 12 g/dL (ref 12.0–15.0)
MCHC: 34.9 g/dL (ref 30.0–36.0)
RBC: 4.2 MIL/uL (ref 3.87–5.11)
WBC: 4.2 10*3/uL (ref 4.0–10.5)

## 2010-09-26 LAB — DIFFERENTIAL
Lymphocytes Relative: 10 % — ABNORMAL LOW (ref 12–46)
Lymphs Abs: 0.4 10*3/uL — ABNORMAL LOW (ref 0.7–4.0)
Monocytes Absolute: 0.1 10*3/uL (ref 0.1–1.0)
Monocytes Relative: 1 % — ABNORMAL LOW (ref 3–12)
Neutro Abs: 3.8 10*3/uL (ref 1.7–7.7)

## 2010-09-27 ENCOUNTER — Encounter (HOSPITAL_COMMUNITY): Payer: Medicare Other

## 2010-09-27 DIAGNOSIS — C549 Malignant neoplasm of corpus uteri, unspecified: Secondary | ICD-10-CM

## 2010-09-30 NOTE — Op Note (Signed)
  NAME:  [name], [name] NO.:  [id number]  MEDICAL RECORD NO.:  [id number]  LOCATION:  DAYP                          FACILITY:  [facility]  PHYSICIAN:  Dalia Heading, M.D.  DATE OF BIRTH:  Sep 30, 1930  DATE OF PROCEDURE:  09/20/2010 DATE OF DISCHARGE:                              OPERATIVE REPORT   PREOPERATIVE DIAGNOSIS:  Endometrial carcinoma, need for central venous access for chemotherapy.  POSTOPERATIVE DIAGNOSIS:  Endometrial carcinoma, need for central venous access for chemotherapy.  PROCEDURE:  Port-A-Cath insertion.  SURGEON:  Dalia Heading, MD  ANESTHESIA:  MAC.  INDICATIONS:  The patient is a 77 year old white female who presents for Port-A-Cath insertion.  She is about to undergo chemotherapy for endometrial carcinoma.  The risks and benefits of the procedure including bleeding, infection, pneumothorax were fully explained to the patient, gave informed consent.  PROCEDURE NOTE:  The patient was placed in the Trendelenburg position after monitored anesthesia care was given.  The left upper chest was prepped and draped using the usual sterile technique with DuraPrep. Surgical site confirmation was performed.  A 1% Xylocaine was used for local anesthesia.  An incision was made below the left clavicle.  Subcutaneous pocket was then formed.  A needle was advanced into left subclavian vein using the Seldinger technique without difficulty.  Guidewire was then advanced into the right atrium under digital fluoroscopy.  An introducer peel- away sheath were placed over the guidewire.  The catheter was then inserted through the peel-away sheath and the peel-away sheath was removed.  The catheter was then sized and attached to a power port.  The power port was then inserted into the subcutaneous pocket.  Adequate positioning was confirmed by fluoroscopy.  Good backflow was noted from the port.  The port was flushed with heparin flush.  The  subcutaneous layer was reapproximated using 3-0 Vicryl interrupted suture.  The skin was closed using a 4-0 Vicryl subcuticular suture.  Dermabond was then applied.  All tape and needle counts were correct at the end of the procedure. The patient was transferred to the PACU in stable condition.  Chest x- ray will be performed at that time.  COMPLICATIONS:  None.  SPECIMEN:  None.  ESTIMATED BLOOD LOSS:  Minimal.     Dalia Heading, M.D.     MAJ/MEDQ  D:  09/20/2010  T:  09/21/2010  Job:  [id number]  cc:   Ladona Horns. Mariel Sleet, MD Fax: [phone]  Electronically Signed by Franky Macho M.D. on 09/30/2010 07:28:17 AM

## 2010-10-05 ENCOUNTER — Encounter (HOSPITAL_COMMUNITY): Payer: Self-pay | Admitting: Oncology

## 2010-10-08 ENCOUNTER — Other Ambulatory Visit (HOSPITAL_COMMUNITY): Payer: Self-pay | Admitting: Oncology

## 2010-10-08 ENCOUNTER — Other Ambulatory Visit: Payer: Self-pay | Admitting: Oncology

## 2010-10-08 DIAGNOSIS — C541 Malignant neoplasm of endometrium: Secondary | ICD-10-CM

## 2010-10-10 ENCOUNTER — Encounter: Payer: Self-pay | Admitting: Cardiology

## 2010-10-11 ENCOUNTER — Encounter (HOSPITAL_COMMUNITY): Payer: Medicare Other | Attending: Oncology | Admitting: Oncology

## 2010-10-11 DIAGNOSIS — C549 Malignant neoplasm of corpus uteri, unspecified: Secondary | ICD-10-CM | POA: Insufficient documentation

## 2010-10-11 DIAGNOSIS — C55 Malignant neoplasm of uterus, part unspecified: Secondary | ICD-10-CM

## 2010-10-15 ENCOUNTER — Other Ambulatory Visit (HOSPITAL_COMMUNITY): Payer: Self-pay | Admitting: Oncology

## 2010-10-16 ENCOUNTER — Other Ambulatory Visit (HOSPITAL_COMMUNITY): Payer: Self-pay | Admitting: Oncology

## 2010-10-16 ENCOUNTER — Encounter (HOSPITAL_BASED_OUTPATIENT_CLINIC_OR_DEPARTMENT_OTHER): Payer: Medicare Other

## 2010-10-16 DIAGNOSIS — C549 Malignant neoplasm of corpus uteri, unspecified: Secondary | ICD-10-CM

## 2010-10-16 LAB — COMPREHENSIVE METABOLIC PANEL
ALT: 13 U/L (ref 0–35)
AST: 19 U/L (ref 0–37)
Albumin: 3.6 g/dL (ref 3.5–5.2)
Alkaline Phosphatase: 86 U/L (ref 39–117)
Chloride: 98 mEq/L (ref 96–112)
Potassium: 4.6 mEq/L (ref 3.5–5.1)
Total Bilirubin: 0.3 mg/dL (ref 0.3–1.2)

## 2010-10-16 LAB — DIFFERENTIAL
Basophils Relative: 1 % (ref 0–1)
Eosinophils Absolute: 0.2 10*3/uL (ref 0.0–0.7)
Eosinophils Relative: 2 % (ref 0–5)
Monocytes Absolute: 0.7 10*3/uL (ref 0.1–1.0)
Monocytes Relative: 12 % (ref 3–12)

## 2010-10-16 LAB — CBC
Hemoglobin: 11.5 g/dL — ABNORMAL LOW (ref 12.0–15.0)
MCH: 29.2 pg (ref 26.0–34.0)
Platelets: 177 10*3/uL (ref 150–400)
RBC: 3.94 MIL/uL (ref 3.87–5.11)
WBC: 6.2 10*3/uL (ref 4.0–10.5)

## 2010-10-17 ENCOUNTER — Encounter (HOSPITAL_BASED_OUTPATIENT_CLINIC_OR_DEPARTMENT_OTHER): Payer: Medicare Other

## 2010-10-17 VITALS — BP 147/64 | HR 70 | Temp 97.5°F | Wt 167.4 lb

## 2010-10-17 DIAGNOSIS — Z5111 Encounter for antineoplastic chemotherapy: Secondary | ICD-10-CM

## 2010-10-17 DIAGNOSIS — C541 Malignant neoplasm of endometrium: Secondary | ICD-10-CM

## 2010-10-17 DIAGNOSIS — C549 Malignant neoplasm of corpus uteri, unspecified: Secondary | ICD-10-CM

## 2010-10-17 MED ORDER — DIPHENHYDRAMINE HCL 50 MG/ML IJ SOLN: 25.0000 mg | Freq: Once | INTRAMUSCULAR | Status: DC | PRN

## 2010-10-17 MED ORDER — ALBUTEROL SULFATE (2.5 MG/3ML) 0.083% IN NEBU: 2.5000 mg | INHALATION_SOLUTION | Freq: Once | RESPIRATORY_TRACT | Status: DC | PRN

## 2010-10-17 MED ORDER — ALTEPLASE 2 MG IJ SOLR: 2.0000 mg | Freq: Once | INTRAMUSCULAR | Status: DC | PRN

## 2010-10-17 MED ORDER — HEPARIN SOD (PORK) LOCK FLUSH 100 UNIT/ML IV SOLN: 500.0000 [IU] | Freq: Once | INTRAVENOUS | Status: DC | PRN

## 2010-10-17 MED ORDER — DEXAMETHASONE SODIUM PHOSPHATE 4 MG/ML IJ SOLN: 8.0000 mg | Freq: Once | INTRAMUSCULAR | Status: DC

## 2010-10-17 MED ORDER — SODIUM CHLORIDE 0.9 % IV SOLN
Freq: Once | INTRAVENOUS | Status: AC
  Administered 2010-10-17: 24 mg via INTRAVENOUS
  Filled 2010-10-17: qty 12

## 2010-10-17 MED ORDER — HEPARIN SOD (PORK) LOCK FLUSH 100 UNIT/ML IV SOLN
INTRAVENOUS | Status: AC
  Filled 2010-10-17: qty 5

## 2010-10-17 MED ORDER — METHYLPREDNISOLONE SODIUM SUCC 125 MG IJ SOLR: 125.0000 mg | Freq: Once | INTRAMUSCULAR | Status: DC | PRN

## 2010-10-17 MED ORDER — HEPARIN SOD (PORK) LOCK FLUSH 100 UNIT/ML IV SOLN: 250.0000 [IU] | Freq: Once | INTRAVENOUS | Status: DC | PRN

## 2010-10-17 MED ORDER — SODIUM CHLORIDE 0.9 % IV SOLN
Freq: Once | INTRAVENOUS | Status: AC
  Administered 2010-10-17: 10:00:00 via INTRAVENOUS

## 2010-10-17 MED ORDER — EPINEPHRINE HCL 0.1 MG/ML IJ SOLN: 0.2500 mg | Freq: Once | INTRAMUSCULAR | Status: DC | PRN

## 2010-10-17 MED ORDER — FAMOTIDINE IN NACL 20-0.9 MG/50ML-% IV SOLN
20.0000 mg | Freq: Two times a day (BID) | INTRAVENOUS | Status: DC
  Administered 2010-10-17: 20 mg via INTRAVENOUS
  Filled 2010-10-17: qty 50

## 2010-10-17 MED ORDER — SODIUM CHLORIDE 0.9 % IJ SOLN
INTRAMUSCULAR | Status: AC
  Filled 2010-10-17: qty 10

## 2010-10-17 MED ORDER — FAMOTIDINE IN NACL 20-0.9 MG/50ML-% IV SOLN
50.0000 mg | Freq: Once | INTRAVENOUS | Status: DC
  Filled 2010-10-17: qty 150

## 2010-10-17 MED ORDER — SODIUM CHLORIDE 0.9 % IV SOLN: 24.0000 mg | Freq: Once | INTRAVENOUS | Status: DC

## 2010-10-17 MED ORDER — DIPHENHYDRAMINE HCL 50 MG/ML IJ SOLN: 50.0000 mg | Freq: Once | INTRAMUSCULAR | Status: DC | PRN

## 2010-10-17 MED ORDER — RANITIDINE HCL 50 MG/2ML IJ SOLN: 50.0000 mg | Freq: Once | INTRAMUSCULAR | Status: DC

## 2010-10-17 MED ORDER — SODIUM CHLORIDE 0.9 % IV SOLN: Freq: Once | INTRAVENOUS | Status: DC | PRN

## 2010-10-17 MED ORDER — COLD PACK MISC ONCOLOGY: 1.0000 | Freq: Once | Status: DC | PRN

## 2010-10-17 MED ORDER — DIPHENHYDRAMINE HCL 50 MG/ML IJ SOLN
50.0000 mg | Freq: Once | INTRAMUSCULAR | Status: AC
  Administered 2010-10-17: 50 mg via INTRAVENOUS

## 2010-10-17 MED ORDER — SODIUM CHLORIDE 0.9 % IJ SOLN
10.0000 mL | INTRAMUSCULAR | Status: DC | PRN
  Administered 2010-10-17: 10 mL

## 2010-10-17 MED ORDER — DIPHENHYDRAMINE HCL 50 MG/ML IJ SOLN
INTRAMUSCULAR | Status: AC
  Filled 2010-10-17: qty 1

## 2010-10-17 MED ORDER — PACLITAXEL CHEMO INJECTION 300 MG/50ML
175.0000 mg/m2 | Freq: Once | INTRAVENOUS | Status: AC
  Administered 2010-10-17: 324 mg via INTRAVENOUS
  Filled 2010-10-17: qty 54

## 2010-10-17 MED ORDER — SODIUM CHLORIDE 0.9 % IV SOLN
420.0000 mg | Freq: Once | INTRAVENOUS | Status: AC
  Administered 2010-10-17: 420 mg via INTRAVENOUS
  Filled 2010-10-17: qty 42

## 2010-10-17 MED ORDER — SODIUM CHLORIDE 0.9 % IJ SOLN: 3.0000 mL | INTRAMUSCULAR | Status: DC | PRN

## 2010-10-18 ENCOUNTER — Ambulatory Visit (HOSPITAL_COMMUNITY): Payer: Medicare Other

## 2010-10-23 ENCOUNTER — Encounter (HOSPITAL_BASED_OUTPATIENT_CLINIC_OR_DEPARTMENT_OTHER): Payer: Medicare Other

## 2010-10-23 DIAGNOSIS — C549 Malignant neoplasm of corpus uteri, unspecified: Secondary | ICD-10-CM

## 2010-10-23 DIAGNOSIS — C541 Malignant neoplasm of endometrium: Secondary | ICD-10-CM

## 2010-10-23 LAB — DIFFERENTIAL
Basophils Relative: 0 % (ref 0–1)
Eosinophils Absolute: 0.7 10*3/uL (ref 0.0–0.7)
Lymphs Abs: 0.9 10*3/uL (ref 0.7–4.0)
Monocytes Absolute: 0 10*3/uL — ABNORMAL LOW (ref 0.1–1.0)
Monocytes Relative: 1 % — ABNORMAL LOW (ref 3–12)
Neutro Abs: 2.4 10*3/uL (ref 1.7–7.7)
Neutrophils Relative %: 60 % (ref 43–77)

## 2010-10-23 LAB — COMPREHENSIVE METABOLIC PANEL
AST: 21 U/L (ref 0–37)
Albumin: 3.8 g/dL (ref 3.5–5.2)
Alkaline Phosphatase: 76 U/L (ref 39–117)
Chloride: 98 mEq/L (ref 96–112)
Potassium: 4.9 mEq/L (ref 3.5–5.1)
Total Bilirubin: 0.5 mg/dL (ref 0.3–1.2)
Total Protein: 7.5 g/dL (ref 6.0–8.3)

## 2010-10-23 LAB — CBC
MCHC: 34.5 g/dL (ref 30.0–36.0)
Platelets: 174 10*3/uL (ref 150–400)
RDW: 14.2 % (ref 11.5–15.5)
WBC: 4 10*3/uL (ref 4.0–10.5)

## 2010-10-23 NOTE — Progress Notes (Signed)
Labs drawn today for cbc,diff,cmp 

## 2010-10-23 NOTE — Progress Notes (Signed)
Patient notified of lab results and that she needed to push fluids since her BUN/creat was elevated. I told her to drink 60 oz of fluid if possible and if unable and feeling nauseated/tired to call us. Pt verbalized understanding.

## 2010-10-30 ENCOUNTER — Encounter (HOSPITAL_BASED_OUTPATIENT_CLINIC_OR_DEPARTMENT_OTHER): Payer: Medicare Other

## 2010-10-30 DIAGNOSIS — C549 Malignant neoplasm of corpus uteri, unspecified: Secondary | ICD-10-CM

## 2010-10-30 DIAGNOSIS — N259 Disorder resulting from impaired renal tubular function, unspecified: Secondary | ICD-10-CM

## 2010-10-30 LAB — CBC
Hemoglobin: 10.3 g/dL — ABNORMAL LOW (ref 12.0–15.0)
MCH: 29.8 pg (ref 26.0–34.0)
MCHC: 35.3 g/dL (ref 30.0–36.0)
MCV: 84.4 fL (ref 78.0–100.0)
RBC: 3.46 MIL/uL — ABNORMAL LOW (ref 3.87–5.11)

## 2010-10-30 LAB — DIFFERENTIAL
Basophils Relative: 0 % (ref 0–1)
Eosinophils Absolute: 0.3 10*3/uL (ref 0.0–0.7)
Eosinophils Relative: 12 % — ABNORMAL HIGH (ref 0–5)
Lymphs Abs: 0.7 10*3/uL (ref 0.7–4.0)
Monocytes Relative: 22 % — ABNORMAL HIGH (ref 3–12)
Neutrophils Relative %: 35 % — ABNORMAL LOW (ref 43–77)

## 2010-10-30 NOTE — Progress Notes (Signed)
Labs drawn today for cbc/diff 

## 2010-11-06 ENCOUNTER — Encounter (HOSPITAL_COMMUNITY): Payer: Self-pay | Admitting: Oncology

## 2010-11-06 ENCOUNTER — Encounter (HOSPITAL_COMMUNITY): Payer: Medicare Other

## 2010-11-06 ENCOUNTER — Encounter (HOSPITAL_COMMUNITY): Payer: Medicare Other | Attending: Oncology

## 2010-11-06 ENCOUNTER — Other Ambulatory Visit (HOSPITAL_COMMUNITY): Payer: Self-pay | Admitting: Oncology

## 2010-11-06 ENCOUNTER — Encounter (HOSPITAL_BASED_OUTPATIENT_CLINIC_OR_DEPARTMENT_OTHER): Payer: Medicare Other | Admitting: Oncology

## 2010-11-06 ENCOUNTER — Ambulatory Visit (HOSPITAL_COMMUNITY): Payer: Medicare Other | Admitting: Oncology

## 2010-11-06 VITALS — BP 152/81 | HR 65 | Temp 97.6°F | Wt 165.6 lb

## 2010-11-06 DIAGNOSIS — C775 Secondary and unspecified malignant neoplasm of intrapelvic lymph nodes: Secondary | ICD-10-CM

## 2010-11-06 DIAGNOSIS — C7982 Secondary malignant neoplasm of genital organs: Secondary | ICD-10-CM

## 2010-11-06 DIAGNOSIS — C549 Malignant neoplasm of corpus uteri, unspecified: Secondary | ICD-10-CM

## 2010-11-06 DIAGNOSIS — C541 Malignant neoplasm of endometrium: Secondary | ICD-10-CM

## 2010-11-06 LAB — COMPREHENSIVE METABOLIC PANEL
Albumin: 3.6 g/dL (ref 3.5–5.2)
Alkaline Phosphatase: 74 U/L (ref 39–117)
BUN: 27 mg/dL — ABNORMAL HIGH (ref 6–23)
CO2: 23 mEq/L (ref 19–32)
Chloride: 100 mEq/L (ref 96–112)
GFR calc Af Amer: >60 mL/min (ref >60)
GFR calc non Af Amer: >60 mL/min (ref >60)
Glucose, Bld: 126 mg/dL — ABNORMAL HIGH (ref 70–99)
Potassium: 4.4 mEq/L (ref 3.5–5.1)
Total Bilirubin: 0.3 mg/dL (ref 0.3–1.2)

## 2010-11-06 LAB — DIFFERENTIAL
Basophils Relative: 0 % (ref 0–1)
Eosinophils Absolute: 0.1 10*3/uL (ref 0.0–0.7)
Monocytes Relative: 10 % (ref 3–12)
Neutrophils Relative %: 59 % (ref 43–77)

## 2010-11-06 LAB — CBC
HCT: 32.4 % — ABNORMAL LOW (ref 36.0–46.0)
Hemoglobin: 11.1 g/dL — ABNORMAL LOW (ref 12.0–15.0)
RBC: 3.8 MIL/uL — ABNORMAL LOW (ref 3.87–5.11)
WBC: 3.9 10*3/uL — ABNORMAL LOW (ref 4.0–10.5)

## 2010-11-06 NOTE — Patient Instructions (Signed)
[facility]  Discharge Instructions  RECOMMENDATIONS MADE BY THE CONSULTANT AND ANY TEST RESULTS WILL BE SENT TO YOUR REFERRING DOCTOR.        SPECIAL INSTRUCTIONS/FOLLOW-UP: Return to clinic as scheduled.   I acknowledge that I have been informed and understand all the instructions given to me and received a copy. I do not have any more questions at this time, but understand that I may call the Specialty Clinic at [facility] at [phone] during business hours should I have any further questions or need assistance in obtaining follow-up care.    __________________________________________  _____________  __________ Signature of Patient or Authorized Representative            Date                   Time    __________________________________________ Nurse's Signature   

## 2010-11-06 NOTE — Progress Notes (Signed)
LindsayXAJE A, MD No address on file  1. Endometrial ca     CURRENT THERAPY:S/P 2 cycles of Carbo/Taxol beginning on 09/26/10.  INTERVAL HISTORY: [name] 77 y.o. female returns for  regular  visit for followup of Stage IIIC endometrial adenocarcinoma with 1/23 positive lymph nodes.  LVI was identified as well as deep uterine muscle invasion.  Washings were negative for tumor cells.  Surgery took place on 07/30/10.  She denies any complaints today.  She reports that she saw Dr. Roselind Messier in consultation prior to initiation of chemotherapy.  She has not seen him since.  It was recommended that the patient undergo 3 cycles of chemotherapy, followed by radiation, and then 3 more cycles of chemotherapy.  The patient is due to have Lee CT scan on 8/15 and Lee follow-up appointment with Dr. Mariel Sleet on 8/17.  Therefore, I will ask our scheduler to communicate with Dr. Trina Ao office that the patient needs to be seen shortly following her 8/17 appointment.   Past Medical History  Diagnosis Date  . Heart disease   . HTN (hypertension)   . Endometrial ca 09/25/2010  . Coronary artery disease   . Hyperlipidemia   . Noninsulin dependent diabetes mellitus   . Osteoarthritis     Severe  . Fluid overload   . Renal insufficiency     Mild with higher dose diuretic    has DM; HYPERLIPIDEMIA; HYPERTENSION; CAD; ATRIAL FIBRILLATION; RENAL INSUFFICIENCY; EDEMA; SHORTNESS OF BREATH; CORONARY ARTERY BYPASS GRAFT, HX OF; and Endometrial ca on her problem list.     is allergic to statins.  [name] does not currently have medications on file.  Past Surgical History  Procedure Date  . Appendectomy 60 yrs ago  . Tonsilectomy, adenoidectomy, bilateral myringotomy and tubes   . Breast mass excision     left breast 1985  . Tumor removal     from throat  benign  . Choleycystectomy 1998  . Cardiac surgery 2010    triple bypass  . Abdominal hysterectomy     complete  . Coronary artery bypass graft  12/21/08    Donata Clay; LIMA LAD, SVG posterior descending, SVG OM, maze procedure, ligation left atrial appendage  . Cardiac catheterization   . Cataract extraction   . Portacath placement     Denies any headaches, dizziness, double vision, fevers, chills, night sweats, nausea, vomiting, diarrhea, constipation, chest pain, heart palpitations, shortness of breath, blood in stool, black tarry stool, urinary pain, urinary burning, urinary frequency, hematuria.   PHYSICAL EXAMINATION  ECOG PERFORMANCE STATUS: 0 - Asymptomatic  Filed Vitals:   11/06/10 1207  BP: 152/81  Pulse: 65  Temp: 97.6 F (36.4 C)    GENERAL:alert, well nourished, well developed, comfortable, cooperative and smiling SKIN: skin color, texture, turgor are normal, no rashes or significant lesions HEAD: Normocephalic, No masses, lesions, tenderness or abnormalities EYES: normal, PERRLA, EOMI EARS: External ears normal OROPHARYNX:mucous membranes are moist  NECK: supple, trachea midline LYMPH:  No epitrochlear nodes noted BREAST:not examined LUNGS: clear to auscultation and percussion HEART: regular rate & rhythm, no murmurs, no gallops, S1 normal and S2 normal ABDOMEN:abdomen soft, non-tender and normal bowel sounds BACK: Back symmetric, no curvature., No CVA tenderness EXTREMITIES:less then 2 second capillary refill, no joint deformities, effusion, or inflammation, no edema, no skin discoloration, no clubbing, no cyanosis  NEURO: alert & oriented x 3 with fluent speech, no focal motor/sensory deficits, gait normal    LABORATORY DATA: Lab Results  Component Value  Date   WBC 3.9* 11/06/2010   HGB 11.1* 11/06/2010   HCT 32.4* 11/06/2010   MCV 85.3 11/06/2010   PLT 96* 11/06/2010     Chemistry      Component Value Date/Time   NA 137 11/06/2010 0945   K 4.4 11/06/2010 0945   CL 100 11/06/2010 0945   CO2 23 11/06/2010 0945   BUN 27* 11/06/2010 0945   CREATININE 0.85 11/06/2010 0945      Component Value Date/Time   CALCIUM  9.6 11/06/2010 0945   ALKPHOS 74 11/06/2010 0945   AST 19 11/06/2010 0945   ALT 13 11/06/2010 0945   BILITOT 0.3 11/06/2010 0945       RADIOGRAPHIC STUDIES: CT scheduled for 11/20/10   PATHOLOGY: 1. Peritoneal washings- reactive mesothelial cells present 2. Lymph nodes, regional resection, right para aortic- 0/9 positive nodes 3. Lymph nodes, regional resection, left para aortic- 0/2 lymph nodes and abundant fibroadipose tissue, negative metastatic carcinoma 4. Uterus +/- tubes/ovaries, neoplastic- Invasive endometrioid carcinoma, invading into the outer half of the myometrium with associated angiolymphatic invasion. Cervix- benign squamous mucosa and endocervical mucosa, no dysplasia or malignancy. Bilateral ovaries- benign ovarian tissue with endosalpingiosis, no endometriosis, atypia, or malignancy. Bilateral fallopian tubes- benign fallopian tube tissue, no atypia or malignancy. 5. Lymph nodes, regional resection, right pelvic- 0/7 positive lymph nodes 6. Lymph nodes, regional resection, left pelvis- 1/5 positive lymph nodes for metastatic disease.    ASSESSMENT:  1. Stage IIIC endometrial adenocarcinoma with 1/23 positive lymph nodes.  LVI was identified as well as deep uterine muscle invasion.  Washings were negative for tumor cells. 2. Neulasta induced severe bone pain.   PLAN:  1. CT scan ordered and scheduled for 11/20/2010 2. Return for follow-up on 11/22/2010 to see Dr. Mariel Sleet 3. Will attain an appointment with Dr. Roselind Messier after 11/22/2010 follow-up appointment 4. Continue as scheduled with cycle 3 of chemotherapy tomorrow.   All questions were answered. The patient knows to call the clinic with any problems, questions or concerns. We can certainly see the patient much sooner if necessary.  I spent 25 minutes counseling the patient face to face. The total time spent in the appointment was 40 minutes.  KEFALAS,THOMAS

## 2010-11-06 NOTE — Progress Notes (Signed)
Labs drawn today for cbc,diff,cmp 

## 2010-11-07 ENCOUNTER — Encounter (HOSPITAL_BASED_OUTPATIENT_CLINIC_OR_DEPARTMENT_OTHER): Payer: Medicare Other

## 2010-11-07 VITALS — BP 144/73 | HR 68 | Temp 97.7°F | Wt 166.4 lb

## 2010-11-07 DIAGNOSIS — Z5111 Encounter for antineoplastic chemotherapy: Secondary | ICD-10-CM

## 2010-11-07 DIAGNOSIS — C549 Malignant neoplasm of corpus uteri, unspecified: Secondary | ICD-10-CM

## 2010-11-07 DIAGNOSIS — C541 Malignant neoplasm of endometrium: Secondary | ICD-10-CM

## 2010-11-07 MED ORDER — DEXTROSE 5 % IV SOLN
175.0000 mg/m2 | Freq: Once | INTRAVENOUS | Status: AC
  Administered 2010-11-07: 324 mg via INTRAVENOUS
  Filled 2010-11-07: qty 54

## 2010-11-07 MED ORDER — DIPHENHYDRAMINE HCL 50 MG/ML IJ SOLN
50.0000 mg | Freq: Once | INTRAMUSCULAR | Status: AC
  Administered 2010-11-07: 50 mg via INTRAVENOUS

## 2010-11-07 MED ORDER — DIPHENHYDRAMINE HCL 50 MG/ML IJ SOLN
INTRAMUSCULAR | Status: AC
  Filled 2010-11-07: qty 1

## 2010-11-07 MED ORDER — SODIUM CHLORIDE 0.9 % IV SOLN
Freq: Once | INTRAVENOUS | Status: AC
  Administered 2010-11-07: 24 mg via INTRAVENOUS
  Filled 2010-11-07: qty 12

## 2010-11-07 MED ORDER — HEPARIN SOD (PORK) LOCK FLUSH 100 UNIT/ML IV SOLN
INTRAVENOUS | Status: AC
  Filled 2010-11-07: qty 5

## 2010-11-07 MED ORDER — HEPARIN SOD (PORK) LOCK FLUSH 100 UNIT/ML IV SOLN
500.0000 [IU] | Freq: Once | INTRAVENOUS | Status: AC | PRN
  Administered 2010-11-07: 500 [IU]

## 2010-11-07 MED ORDER — SODIUM CHLORIDE 0.9 % IJ SOLN
INTRAMUSCULAR | Status: AC
  Filled 2010-11-07: qty 10

## 2010-11-07 MED ORDER — SODIUM CHLORIDE 0.9 % IV SOLN
Freq: Once | INTRAVENOUS | Status: AC
  Administered 2010-11-07: 500 mL via INTRAVENOUS

## 2010-11-07 MED ORDER — SODIUM CHLORIDE 0.9 % IJ SOLN
10.0000 mL | INTRAMUSCULAR | Status: DC | PRN
  Administered 2010-11-07: 10 mL

## 2010-11-07 MED ORDER — DEXAMETHASONE SODIUM PHOSPHATE 4 MG/ML IJ SOLN: 8.0000 mg | Freq: Once | INTRAMUSCULAR | Status: DC

## 2010-11-07 MED ORDER — ONDANSETRON HCL 4 MG/2ML IJ SOLN: 24.0000 mg | Freq: Once | INTRAMUSCULAR | Status: DC

## 2010-11-07 MED ORDER — SODIUM CHLORIDE 0.9 % IV SOLN
420.0000 mg | Freq: Once | INTRAVENOUS | Status: AC
  Administered 2010-11-07: 420 mg via INTRAVENOUS
  Filled 2010-11-07: qty 42

## 2010-11-07 MED ORDER — RANITIDINE HCL 50 MG/2ML IJ SOLN: 50.0000 mg | Freq: Once | INTRAMUSCULAR | Status: DC

## 2010-11-07 MED ORDER — FAMOTIDINE IN NACL 20-0.9 MG/50ML-% IV SOLN
20.0000 mg | Freq: Once | INTRAVENOUS | Status: AC
  Administered 2010-11-07: 20 mg via INTRAVENOUS
  Filled 2010-11-07: qty 50

## 2010-11-07 NOTE — Progress Notes (Signed)
Tolerated chemo well. 

## 2010-11-08 ENCOUNTER — Ambulatory Visit (HOSPITAL_COMMUNITY): Payer: Medicare Other

## 2010-11-08 ENCOUNTER — Telehealth (HOSPITAL_COMMUNITY): Payer: Self-pay | Admitting: Oncology

## 2010-11-14 ENCOUNTER — Encounter (HOSPITAL_BASED_OUTPATIENT_CLINIC_OR_DEPARTMENT_OTHER): Payer: Medicare Other

## 2010-11-14 DIAGNOSIS — C549 Malignant neoplasm of corpus uteri, unspecified: Secondary | ICD-10-CM

## 2010-11-14 DIAGNOSIS — C541 Malignant neoplasm of endometrium: Secondary | ICD-10-CM

## 2010-11-14 LAB — CBC
MCH: 30.1 pg (ref 26.0–34.0)
Platelets: 115 10*3/uL — ABNORMAL LOW (ref 150–400)
RBC: 3.66 MIL/uL — ABNORMAL LOW (ref 3.87–5.11)
WBC: 2.3 10*3/uL — ABNORMAL LOW (ref 4.0–10.5)

## 2010-11-14 LAB — DIFFERENTIAL
Basophils Absolute: 0 10*3/uL (ref 0.0–0.1)
Eosinophils Absolute: 0.3 10*3/uL (ref 0.0–0.7)
Lymphs Abs: 0.9 10*3/uL (ref 0.7–4.0)
Neutrophils Relative %: 47 % (ref 43–77)

## 2010-11-14 NOTE — Progress Notes (Signed)
Labs drawn today for cbc/diff 

## 2010-11-20 ENCOUNTER — Ambulatory Visit (HOSPITAL_COMMUNITY)
Admission: RE | Admit: 2010-11-20 | Discharge: 2010-11-20 | Disposition: A | Discharge status: Home / Self Care | Payer: Medicare Other | Source: Ambulatory Visit | Attending: Oncology | Admitting: Oncology

## 2010-11-20 DIAGNOSIS — C541 Malignant neoplasm of endometrium: Secondary | ICD-10-CM

## 2010-11-20 DIAGNOSIS — I10 Essential (primary) hypertension: Secondary | ICD-10-CM | POA: Insufficient documentation

## 2010-11-20 DIAGNOSIS — C549 Malignant neoplasm of corpus uteri, unspecified: Secondary | ICD-10-CM | POA: Insufficient documentation

## 2010-11-20 DIAGNOSIS — Z9889 Other specified postprocedural states: Secondary | ICD-10-CM | POA: Insufficient documentation

## 2010-11-20 DIAGNOSIS — E785 Hyperlipidemia, unspecified: Secondary | ICD-10-CM | POA: Insufficient documentation

## 2010-11-20 DIAGNOSIS — Z9221 Personal history of antineoplastic chemotherapy: Secondary | ICD-10-CM | POA: Insufficient documentation

## 2010-11-20 MED ORDER — IOHEXOL 300 MG/ML  SOLN
100.0000 mL | Freq: Once | INTRAMUSCULAR | Status: AC | PRN
  Administered 2010-11-20: 100 mL via INTRAVENOUS

## 2010-11-22 ENCOUNTER — Encounter (HOSPITAL_BASED_OUTPATIENT_CLINIC_OR_DEPARTMENT_OTHER): Payer: Medicare Other | Admitting: Oncology

## 2010-11-22 VITALS — BP 143/79 | HR 65 | Temp 97.9°F | Wt 164.6 lb

## 2010-11-22 DIAGNOSIS — C541 Malignant neoplasm of endometrium: Secondary | ICD-10-CM

## 2010-11-22 DIAGNOSIS — C549 Malignant neoplasm of corpus uteri, unspecified: Secondary | ICD-10-CM

## 2010-11-22 NOTE — Progress Notes (Signed)
This office note has been dictated.

## 2010-11-22 NOTE — Progress Notes (Signed)
CC:   Paola A. Duard Brady, MD [name], MD Billie Lade, Ph.D., M.D. Telford Nab, R.N.  DIAGNOSES: 1. Stage IIIC endometrial adenocarcinoma with 1 of 23 positive nodes     with lymphovascular invasion, deep uterine muscle invasion.     Washings were negative for tumor cells, with surgery taking place     on 07/30/2010, and she has had 3 cycles of carboplatin and Taxol.     Her first cycle was on 09/26/2010. 2. Diabetes mellitus, non-insulin dependent. 3. Neulasta-induced severe bone pain. 4. Myocardial infarction September 2010. 5. Hypertension. 6. Appendectomy in the past. 7. Laparoscopic cholecystectomy in 1998. 8. Coronary artery bypass graft in September 2002. 9. Non-thyroid mass from the neck removed in 1982. 10.Left breast biopsy in 1984.  [facility] is here today with her sister, Hedy Jacob.  She is here to go over the CAT scan which was done the other day, which was read as showing enlarged lymph nodes in both bilateral iliac areas.  In going over it with a different radiologist, name of Dr. Elie Goody today, he thinks it may be water density consistent with a lymphocele, but the report was read by Dr. Tyron Russell as showing lymphadenopathy.  So I talked to Dr. Roselind Messier before I went in to see [place] and he also feels that they are lymphoceles and I think we both have to hope that that is what they are.  Dr. Elie Goody is actually going to get Dr. Tyron Russell to re-review the scan and call me back Monday.  Otherwise her vital signs are fine.  She is asymptomatic.  She looks good.  She is just a little tired.  She has not had any fevers, chills, etc.  Bowels are working well.  She is due to start radiation on Tuesday of next week anyway and I think we should be optimistic, believe that these are lymphoceles, and move on with definitive therapy for her.  We will then see her here on October 2 or 3 to re-plan and initiate therapy 3 weeks after her radiation is completed with 3  more cycles of carboplatin and Taxol.  We will see her then sooner if need be.    ______________________________ Ladona Horns. Mariel Sleet, MD ESN/MEDQ  D:  11/22/2010  T:  11/22/2010  Job:  [id number]

## 2010-11-27 ENCOUNTER — Ambulatory Visit: Payer: Medicare Other | Attending: Gynecologic Oncology | Admitting: Gynecologic Oncology

## 2010-11-27 DIAGNOSIS — I898 Other specified noninfective disorders of lymphatic vessels and lymph nodes: Secondary | ICD-10-CM | POA: Insufficient documentation

## 2010-11-27 DIAGNOSIS — Z79899 Other long term (current) drug therapy: Secondary | ICD-10-CM | POA: Insufficient documentation

## 2010-11-27 DIAGNOSIS — Z90710 Acquired absence of both cervix and uterus: Secondary | ICD-10-CM | POA: Insufficient documentation

## 2010-11-27 DIAGNOSIS — Z7982 Long term (current) use of aspirin: Secondary | ICD-10-CM | POA: Insufficient documentation

## 2010-11-27 DIAGNOSIS — Z9079 Acquired absence of other genital organ(s): Secondary | ICD-10-CM | POA: Insufficient documentation

## 2010-11-27 DIAGNOSIS — C549 Malignant neoplasm of corpus uteri, unspecified: Secondary | ICD-10-CM | POA: Insufficient documentation

## 2010-12-04 NOTE — Consult Note (Signed)
[name], [name]              ACCOUNT NO.:  [id number]  MEDICAL RECORD NO.:  [id number]  LOCATION:  GYN                          FACILITY:  [facility]  PHYSICIAN:  Paola A. Duard Brady, MD    DATE OF BIRTH:  [birth year]  DATE OF CONSULTATION:  11/27/2010 DATE OF DISCHARGE:                                CONSULTATION   HISTORY:  [name] is a very pleasant 77 year old who was initially referred to [place] by Dr. Darnelle Bos.  At that time, she had an episode of bleeding per rectum, colonoscopy was not feasible secondary to acute diverticulitis, for which she was treated with antibiotics.  A CT scan of the abdomen and pelvis in March 2012 revealed a thickening in the fundus of the uterus and she underwent an endometrial biopsy that revealed a grade 2 endometrioid adenocarcinoma.  On April 24th, she underwent a total robotic hysterectomy, BSO, bilateral pelvic and para- aortic lymph node dissection.  Pathology returned as a grade 2 endometrioid adenocarcinoma with 0.9 out of 1.2 cm myometrial invasion. She had evidence of lymphovascular space involvement.  0 out of 9 right periaortic, 0 out of 2 left periaortic, 0 out of 7 right pelvic nodes were negative, however, 1 out of 5 left pelvic lymph nodes were positive for a total of 1 out of 23 lymph nodes.  She was counseled regarding GOT protocol, but declined and is being treated with paclitaxel and carboplatin and radiation in a sandwich fashion.  She has completed 3 cycles of paclitaxel and carboplatin and underwent a CT scan recently that was ordered by Dr. Mariel Sleet.  The CT scan from August 15th read that there was bilateral pelvic lymphadenopathy, measuring 4.4 x 3.1 cm. Dr. Mariel Sleet now very eloquently describes that a discussion was had with Radiology that this most likely represents a lymphocele, but was read out as showing lymphadenopathy.  That report will get amended, Dr. Roselind Messier concurs.  She has undergone her second day of  radiation.  REVIEW OF SYSTEMS:  She states that she really had some low-fatigue issues during her chemotherapy.  She received what sound like Neulasta with first cycle and had significant bone pain and did not receive that again.  She felt quite lifeless after her first cycle.  She hurt significantly for 4-5 days and then it got better.  She has really had no nausea throughout.  She states that since she stopped getting the Neulasta, they have really not had any problems with her counts and she has been able to go through chemotherapy without delay.  She feels very positive about her treatment.  She is very happy that she never experienced any nausea.  She is hopeful that her radiation will go well. Yesterday, after her first radiation, she went out to have breakfast and felt queasy, after that, today she has had Boost and liquids, but no solid food and is feeling quite well after her radiation therapy.  She has had no vaginal bleeding, any significant change in bowel or bladder habits.  She denies any neuropathy.  MEDICATION LIST:  Reviewed and include glucosamine, Lasix, potassium chloride, fish oil, glipizide, Glucophage, calcium carbonate, metoprolol, aspirin, Norvasc.  PHYSICAL EXAMINATION:  VITAL  SIGNS:  Weight 163 pounds, blood pressure 134/80, pulse 64, respirations 16, temperature 97.7. GENERAL:  A well-nourished, well-developed female, in no acute distress.  Physical exam was deferred as the patient recently had a physical examination by Dr. Roselind Messier last week.  ASSESSMENT:  A 77 year old with stage IIIC1 endometrioid adenocarcinoma is being treated with paclitaxel and carboplatin radiation in a sandwich format.  She has completed her first 3 cycles of chemotherapy as it appears to be an unremarkable, CT scan is undergoing her radiation. With regard to the lymphocele, we will see if Radiology is able to amend the report.  If that is indeed a benign lesion that was suspect it  to be, it can be followed expectantly.  She asked whether it needed to be drained or removed and typically we will follow this expectantly unless it becomes larger or somehow symptomatic, which does not appear to be the case.  I will see the patient when she completes her last 3 cycles of chemotherapy and we can enter a surveillance program.  I would recommend proceeding with a CT scan after she completes her last cycle of chemotherapy.  She is very appreciative of the care she has received and I am very appreciative with the assistance from Dr. Mariel Sleet and Dr. Roselind Messier.     Paola A. Duard Brady, MD     PAG/MEDQ  D:  11/27/2010  T:  11/28/2010  Job:  [id number]  cc:   Billie Lade, Ph.D., M.D. Fax: [phone]  Telford Nab, R.N. [address] [place], [place] [address]  Ladona Horns. Mariel Sleet, MD Fax: [phone]  [name], MD Fax: [phone]  Dr. Darnelle Bos  Electronically Signed by Cleda Mccreedy MD on 12/04/2010 01:58:22 PM

## 2010-12-17 ENCOUNTER — Encounter (HOSPITAL_COMMUNITY): Payer: Medicare Other | Attending: Oncology

## 2010-12-17 DIAGNOSIS — C541 Malignant neoplasm of endometrium: Secondary | ICD-10-CM

## 2010-12-17 DIAGNOSIS — C549 Malignant neoplasm of corpus uteri, unspecified: Secondary | ICD-10-CM | POA: Insufficient documentation

## 2010-12-17 DIAGNOSIS — Z452 Encounter for adjustment and management of vascular access device: Secondary | ICD-10-CM

## 2010-12-17 MED ORDER — HEPARIN SOD (PORK) LOCK FLUSH 100 UNIT/ML IV SOLN
500.0000 [IU] | Freq: Once | INTRAVENOUS | Status: AC
  Administered 2010-12-17: 500 [IU] via INTRAVENOUS
  Filled 2010-12-17: qty 5

## 2010-12-17 MED ORDER — HEPARIN SOD (PORK) LOCK FLUSH 100 UNIT/ML IV SOLN
INTRAVENOUS | Status: AC
  Administered 2010-12-17: 500 [IU] via INTRAVENOUS
  Filled 2010-12-17: qty 5

## 2010-12-17 MED ORDER — SODIUM CHLORIDE 0.9 % IJ SOLN
INTRAMUSCULAR | Status: AC
  Administered 2010-12-17: 10 mL via INTRAVENOUS
  Filled 2010-12-17: qty 10

## 2010-12-17 MED ORDER — SODIUM CHLORIDE 0.9 % IJ SOLN
10.0000 mL | INTRAMUSCULAR | Status: DC | PRN
  Administered 2010-12-17: 10 mL via INTRAVENOUS
  Filled 2010-12-17: qty 10

## 2010-12-17 NOTE — Progress Notes (Signed)
Port accessed and flushed per clinic protocol.  Tolerated well. 

## 2010-12-19 ENCOUNTER — Encounter (HOSPITAL_COMMUNITY): Payer: Medicare Other

## 2011-01-08 ENCOUNTER — Encounter (HOSPITAL_COMMUNITY): Payer: Medicare Other | Attending: Oncology | Admitting: Oncology

## 2011-01-08 VITALS — BP 122/72 | HR 70 | Temp 97.9°F | Wt 152.4 lb

## 2011-01-08 DIAGNOSIS — C541 Malignant neoplasm of endometrium: Secondary | ICD-10-CM

## 2011-01-08 DIAGNOSIS — C549 Malignant neoplasm of corpus uteri, unspecified: Secondary | ICD-10-CM | POA: Insufficient documentation

## 2011-01-08 LAB — BASIC METABOLIC PANEL
Calcium: 8.7 mg/dL (ref 8.4–10.5)
GFR calc Af Amer: >90 mL/min (ref >90)
GFR calc non Af Amer: 79 mL/min — ABNORMAL LOW (ref >90)
Potassium: 3.6 mEq/L (ref 3.5–5.1)
Sodium: 139 mEq/L (ref 135–145)

## 2011-01-08 LAB — DIFFERENTIAL
Basophils Absolute: 0 10*3/uL (ref 0.0–0.1)
Basophils Relative: 0 % (ref 0–1)
Eosinophils Absolute: 0.2 10*3/uL (ref 0.0–0.7)
Eosinophils Relative: 7 % — ABNORMAL HIGH (ref 0–5)
Lymphs Abs: 0.4 10*3/uL — ABNORMAL LOW (ref 0.7–4.0)
Neutrophils Relative %: 65 % (ref 43–77)

## 2011-01-08 LAB — CBC
MCH: 31.9 pg (ref 26.0–34.0)
MCV: 90 fL (ref 78.0–100.0)
Platelets: 137 10*3/uL — ABNORMAL LOW (ref 150–400)
RBC: 3.29 MIL/uL — ABNORMAL LOW (ref 3.87–5.11)
RDW: 14.8 % (ref 11.5–15.5)

## 2011-01-08 NOTE — Patient Instructions (Signed)
[facility]  Discharge Instructions  RECOMMENDATIONS MADE BY THE CONSULTANT AND ANY TEST RESULTS WILL BE SENT TO YOUR REFERRING DOCTOR.  SPECIAL INSTRUCTIONS/FOLLOW-UP: Lab work Needed today and before chemotherapy and Return to Clinic on November 13 for chemotherapy.  Please see the front desk as you leave to make your appointments.   I acknowledge that I have been informed and understand all the instructions given to me and received a copy. I do not have any more questions at this time, but understand that I may call the Specialty Clinic at [facility] at [phone] during business hours should I have any further questions or need assistance in obtaining follow-up care.    __________________________________________  _____________  __________ Signature of Patient or Authorized Representative            Date                   Time    __________________________________________ Nurse's Signature

## 2011-01-08 NOTE — Progress Notes (Signed)
Lindsay A, MD No address on file  1. Endometrial ca  CBC, Differential, Basic metabolic panel, CBC, Differential, Comprehensive metabolic panel, CBC, Differential, Basic metabolic panel    CURRENT THERAPY: S/P 3 cycles of carboplatin/taxol (09/26/10 - 11/07/10).  She is now S/P 4500 cGy of external beam radiation.  She is now undergoing intracavitary brachytherapy in [place].  She is scheduled to finish with that therapy on October 23rd.  We will anticipate the patient undergoing 3 more cycles of carbo/taxol 3 weeks following January 28, 2011 (November 13th).  INTERVAL HISTORY: [name] 77 y.o. female returns for  regular  visit for followup of Stage IIIC endometrial adenocarcinoma.  The patient reports that radiation therapy was difficult for her.  She had diarrhea for Lee number of weeks with radiation.  She was taking lomotil for this symptom.  She is please today to tell me that today is her second day with no diarrhea.    She asks questions about future radiographic studies and I have deferred that to Dr. Mariel Sleet.  I suspect we will perform Lee PET scan or CT scan 2 weeks following chemotherapy.  The patient admits to fatigue and poor energy levels.  She denies any nausea or vomiting.  She denies any other complaints.  Patient asks if she can have the flu shot. We recommended that she wait approximately one week and then get the flu shot due to recently resolving diarrhea.  Past Medical History  Diagnosis Date  . Heart disease   . HTN (hypertension)   . Endometrial ca 09/25/2010  . Coronary artery disease   . Hyperlipidemia   . Noninsulin dependent diabetes mellitus   . Osteoarthritis     Severe  . Fluid overload   . Renal insufficiency     Mild with higher dose diuretic    has DM; HYPERLIPIDEMIA; HYPERTENSION; CAD; ATRIAL FIBRILLATION; RENAL INSUFFICIENCY; EDEMA; SHORTNESS OF BREATH; CORONARY ARTERY BYPASS GRAFT, HX OF; and Endometrial ca on her problem list.     is allergic to statins.  [name] does not currently have medications on file.  Past Surgical History  Procedure Date  . Appendectomy 60 yrs ago  . Tonsilectomy, adenoidectomy, bilateral myringotomy and tubes   . Breast mass excision     left breast 1985  . Tumor removal     from throat  benign  . Choleycystectomy 1998  . Cardiac surgery 2010    triple bypass  . Abdominal hysterectomy     complete  . Coronary artery bypass graft 12/21/08    Donata Clay; LIMA LAD, SVG posterior descending, SVG OM, maze procedure, ligation left atrial appendage  . Cardiac catheterization   . Cataract extraction   . Portacath placement     Dnies any headaches, dizziness, double vision, fevers, chills, night sweats, nausea, vomiting, diarrhea, constipation, chest pain, heart palpitations, shortness of breath, blood in stool, black tarry stool, urinary pain, urinary burning, urinary frequency, hematuria.   PHYSICAL EXAMINATION  ECOG PERFORMANCE STATUS: 1 - Symptomatic but completely ambulatory  Filed Vitals:   01/08/11 1315  BP: 122/72  Pulse: 70  Temp: 97.9 F (36.6 C)    GENERAL:alert, no distress, well nourished, well developed, comfortable, cooperative and smiling SKIN: skin color, texture, turgor are normal HEAD: Normocephalic EYES: normal EARS: External ears normal OROPHARYNX:mucous membranes are moist  NECK: supple, no adenopathy, no bruits, thyroid normal size, non-tender, without nodularity, no stridor, non-tender, trachea midline LYMPH:  no palpable lymphadenopathy BREAST:not examined LUNGS: clear to auscultation  and percussion HEART: regular rate & rhythm, no murmurs, no gallops, S1 normal and S2 normal ABDOMEN:abdomen soft, non-tender and normal bowel sounds BACK: Back symmetric, no curvature. EXTREMITIES:less then 2 second capillary refill, no joint deformities, effusion, or inflammation, no edema, no skin discoloration, no clubbing, no cyanosis  NEURO: alert & oriented x 3  with fluent speech, no focal motor/sensory deficits, gait normal   LABORATORY DATA: CBC    Component Value Date/Time   WBC 3.1* 01/08/2011 1403   RBC 3.29* 01/08/2011 1403   HGB 10.5* 01/08/2011 1403   HCT 29.6* 01/08/2011 1403   PLT 137* 01/08/2011 1403   MCV 90.0 01/08/2011 1403   MCH 31.9 01/08/2011 1403   MCHC 35.5 01/08/2011 1403   RDW 14.8 01/08/2011 1403   LYMPHSABS 0.4* 01/08/2011 1403   MONOABS 0.4 01/08/2011 1403   EOSABS 0.2 01/08/2011 1403   BASOSABS 0.0 01/08/2011 1403      Chemistry      Component Value Date/Time   NA 139 01/08/2011 1403   K 3.6 01/08/2011 1403   CL 102 01/08/2011 1403   CO2 27 01/08/2011 1403   BUN 13 01/08/2011 1403   CREATININE 0.75 01/08/2011 1403      Component Value Date/Time   CALCIUM 8.7 01/08/2011 1403   ALKPHOS 74 11/06/2010 0945   AST 19 11/06/2010 0945   ALT 13 11/06/2010 0945   BILITOT 0.3 11/06/2010 0945      PATHOLOGY: 1. Lymph nodes, regional resection, right para-aortic- 9 lymph nodes negative for metastatic carcinoma. 2. Lymph nodes, regional resection, left para-aortic-2 small lymph nodes and abundant fibroadipose tissue, negative for metastatic carcinoma 3. Uterus +/- tubes/ovaries, neoplastic-invasive endometrioid carcinoma, invading into the outer half of the myometrium with associated angiolymphatic invasion. Cervix: Benign squamous mucosa and endocervical mucosa, no dysplasia or malignancy. Bilateral ovaries: Space benign ovarian tissue with endosaplingiosis, no endometriosis, atypia, or malignancy. Bilateral fallopian tubes: Benign fallopian tubal tissue, no atypia or malignancy. 4. Lymph nodes, regional resection, right pelvic-7 lymph nodes negative for metastatic carcinoma. 5. Lymph nodes, regional resection, left pelvic-105 lymph nodes positive for metastatic carcinoma.   ASSESSMENT:  1. stage IIIc endometrial adenocarcinoma with 1/23 positive nodes. LVI was identified as well as the uterine muscle invasion giving her Lee stage IIIc  disease. Washings were negative for tumor cells. Surgery took place on 07/30/2010 she had her first 3 cycles of carboplatin and Taxol 09/26/2010- 11/07/10.  She is now status post external beam radiation. She is presently undergoing intracavitary brachytherapy. We anticipate embarking on her last 3 cycles of chemotherapy consisting of carboplatin and Taxol beginning on 02/18/2011 which is 3 weeks following the completion of intracavitary brachytherapy. 2. Neulasta induced severe bone pain. 3. Diabetes mellitus, non-insulin-dependent. 4. Myocardial infarction in September 2010.   PLAN:  1. Lab work today in light of diarrhea in the past: CBC diff, BMET 2. Patient may get Flu shot in one weeks time. 3. Return on November 13th 2012 for chemotherapy and for follow-up. 4. Patient has declined chemotherapy teaching. 5. Pre-chemo lab work on November 13th: CBC diff, CMET.  Do not need to wait on CMET   All questions were answered. The patient knows to call the clinic with any problems, questions or concerns. We can certainly see the patient much sooner if necessary.  The patient and plan discussed with Glenford Peers, MD and he is in agreement with the aforementioned.  More than 50% of the time spent with the patient was utilized for counseling.  [name]

## 2011-01-14 ENCOUNTER — Ambulatory Visit
Admission: RE | Admit: 2011-01-14 | Discharge: 2011-01-14 | Disposition: A | Discharge status: Home / Self Care | Payer: Medicare Other | Source: Ambulatory Visit | Attending: Radiation Oncology | Admitting: Radiation Oncology

## 2011-01-14 DIAGNOSIS — Z510 Encounter for antineoplastic radiation therapy: Secondary | ICD-10-CM | POA: Insufficient documentation

## 2011-01-14 DIAGNOSIS — C549 Malignant neoplasm of corpus uteri, unspecified: Secondary | ICD-10-CM | POA: Insufficient documentation

## 2011-01-28 ENCOUNTER — Encounter (HOSPITAL_COMMUNITY): Payer: Medicare Other

## 2011-01-29 ENCOUNTER — Encounter (HOSPITAL_COMMUNITY): Payer: Medicare Other

## 2011-01-29 MED ORDER — HEPARIN SOD (PORK) LOCK FLUSH 100 UNIT/ML IV SOLN
INTRAVENOUS | Status: AC
  Filled 2011-01-29: qty 5

## 2011-01-29 MED ORDER — SODIUM CHLORIDE 0.9 % IJ SOLN
INTRAMUSCULAR | Status: AC
  Filled 2011-01-29: qty 10

## 2011-01-29 NOTE — Progress Notes (Unsigned)
[name] presented for Portacath access and flush. Proper placement of portacath confirmed by CXR. Portacath located left chest wall accessed with  H 20 needle. Good blood return present. Portacath flushed with 20ml NS and 500U/46ml Heparin and needle removed intact. Procedure without incident. Patient tolerated procedure well.

## 2011-02-18 ENCOUNTER — Encounter (HOSPITAL_COMMUNITY): Payer: Medicare Other | Attending: Oncology

## 2011-02-18 ENCOUNTER — Inpatient Hospital Stay (HOSPITAL_COMMUNITY): Payer: Medicare Other

## 2011-02-18 ENCOUNTER — Encounter (HOSPITAL_COMMUNITY): Payer: Self-pay | Admitting: Oncology

## 2011-02-18 ENCOUNTER — Encounter (HOSPITAL_BASED_OUTPATIENT_CLINIC_OR_DEPARTMENT_OTHER): Payer: Medicare Other

## 2011-02-18 ENCOUNTER — Encounter (HOSPITAL_BASED_OUTPATIENT_CLINIC_OR_DEPARTMENT_OTHER): Payer: Medicare Other | Admitting: Oncology

## 2011-02-18 VITALS — BP 134/67 | HR 62 | Temp 97.5°F | Ht 62.5 in | Wt 160.8 lb

## 2011-02-18 DIAGNOSIS — C549 Malignant neoplasm of corpus uteri, unspecified: Secondary | ICD-10-CM

## 2011-02-18 DIAGNOSIS — E119 Type 2 diabetes mellitus without complications: Secondary | ICD-10-CM

## 2011-02-18 DIAGNOSIS — C541 Malignant neoplasm of endometrium: Secondary | ICD-10-CM

## 2011-02-18 DIAGNOSIS — Z5111 Encounter for antineoplastic chemotherapy: Secondary | ICD-10-CM

## 2011-02-18 LAB — CBC
HCT: 29.2 % — ABNORMAL LOW (ref 36.0–46.0)
Hemoglobin: 10.4 g/dL — ABNORMAL LOW (ref 12.0–15.0)
MCH: 32.1 pg (ref 26.0–34.0)
MCHC: 35.6 g/dL (ref 30.0–36.0)
RBC: 3.24 MIL/uL — ABNORMAL LOW (ref 3.87–5.11)

## 2011-02-18 LAB — DIFFERENTIAL
Basophils Absolute: 0 10*3/uL (ref 0.0–0.1)
Eosinophils Absolute: 0 10*3/uL (ref 0.0–0.7)
Lymphs Abs: 0.3 10*3/uL — ABNORMAL LOW (ref 0.7–4.0)
Neutrophils Relative %: 91 % — ABNORMAL HIGH (ref 43–77)

## 2011-02-18 LAB — COMPREHENSIVE METABOLIC PANEL
Alkaline Phosphatase: 107 U/L (ref 39–117)
BUN: 31 mg/dL — ABNORMAL HIGH (ref 6–23)
CO2: 23 mEq/L (ref 19–32)
Calcium: 10.2 mg/dL (ref 8.4–10.5)
GFR calc Af Amer: 77 mL/min — ABNORMAL LOW (ref >90)
GFR calc non Af Amer: 67 mL/min — ABNORMAL LOW (ref >90)
Glucose, Bld: 394 mg/dL — ABNORMAL HIGH (ref 70–99)
Potassium: 5 mEq/L (ref 3.5–5.1)
Total Protein: 7.7 g/dL (ref 6.0–8.3)

## 2011-02-18 MED ORDER — SODIUM CHLORIDE 0.9 % IV SOLN
Freq: Once | INTRAVENOUS | Status: AC
  Administered 2011-02-18: 24 mg via INTRAVENOUS
  Filled 2011-02-18: qty 12

## 2011-02-18 MED ORDER — SODIUM CHLORIDE 0.9 % IV SOLN
Freq: Once | INTRAVENOUS | Status: AC
  Administered 2011-02-18: 10:00:00 via INTRAVENOUS

## 2011-02-18 MED ORDER — HEPARIN SOD (PORK) LOCK FLUSH 100 UNIT/ML IV SOLN
500.0000 [IU] | Freq: Once | INTRAVENOUS | Status: AC
  Administered 2011-02-18: 500 [IU]
  Filled 2011-02-18: qty 5

## 2011-02-18 MED ORDER — SODIUM CHLORIDE 0.9 % IV SOLN
420.0000 mg | Freq: Once | INTRAVENOUS | Status: AC
  Administered 2011-02-18: 420 mg via INTRAVENOUS
  Filled 2011-02-18: qty 42

## 2011-02-18 MED ORDER — RANITIDINE HCL 50 MG/2ML IJ SOLN: 50.0000 mg | Freq: Once | INTRAMUSCULAR | Status: DC

## 2011-02-18 MED ORDER — DIPHENHYDRAMINE HCL 50 MG/ML IJ SOLN
INTRAMUSCULAR | Status: AC
  Administered 2011-02-18: 50 mg via INTRAVENOUS
  Filled 2011-02-18: qty 1

## 2011-02-18 MED ORDER — SODIUM CHLORIDE 0.9 % IJ SOLN
10.0000 mL | INTRAMUSCULAR | Status: DC | PRN
  Administered 2011-02-18: 10 mL
  Filled 2011-02-18: qty 10

## 2011-02-18 MED ORDER — DIPHENHYDRAMINE HCL 50 MG/ML IJ SOLN
50.0000 mg | Freq: Once | INTRAMUSCULAR | Status: AC
  Administered 2011-02-18: 50 mg via INTRAVENOUS

## 2011-02-18 MED ORDER — HEPARIN SOD (PORK) LOCK FLUSH 100 UNIT/ML IV SOLN
INTRAVENOUS | Status: AC
  Administered 2011-02-18: 500 [IU]
  Filled 2011-02-18: qty 5

## 2011-02-18 MED ORDER — DEXAMETHASONE SODIUM PHOSPHATE 4 MG/ML IJ SOLN: 8.0000 mg | Freq: Once | INTRAMUSCULAR | Status: DC

## 2011-02-18 MED ORDER — PACLITAXEL CHEMO INJECTION 300 MG/50ML
175.0000 mg/m2 | Freq: Once | INTRAVENOUS | Status: AC
  Administered 2011-02-18: 312 mg via INTRAVENOUS
  Filled 2011-02-18: qty 52

## 2011-02-18 MED ORDER — SODIUM CHLORIDE 0.9 % IJ SOLN
INTRAMUSCULAR | Status: AC
  Filled 2011-02-18: qty 20

## 2011-02-18 MED ORDER — FAMOTIDINE IN NACL 20-0.9 MG/50ML-% IV SOLN
20.0000 mg | Freq: Once | INTRAVENOUS | Status: AC
  Administered 2011-02-18: 20 mg via INTRAVENOUS
  Filled 2011-02-18: qty 50

## 2011-02-18 MED ORDER — SODIUM CHLORIDE 0.9 % IV SOLN: 24.0000 mg | Freq: Once | INTRAVENOUS | Status: DC

## 2011-02-18 NOTE — Progress Notes (Signed)
This office note has been dictated.

## 2011-02-18 NOTE — Progress Notes (Signed)
CC:   Paola A. Duard Brady, MD Telford Nab, R.N. [name], MD Billie Lade, Ph.D., M.D.  DIAGNOSES: 1. Stage IIIC endometrial adenocarcinoma with 1 of 23 positive nodes     and LVI with deep uterine muscle invasion.  She had negative     washings for tumor cells, surgery taking place on 07/30/2010.  She     had 3 cycles of postoperative carboplatin and Taxol then underwent     both external beam radiation therapy as well as intracavitary     radiation and is here today to complete 3 more cycles of     carboplatin and Taxol. 2. Diabetes mellitus noninsulin dependent.  She is on glipizide and     metformin 1 pill twice a day.  She had increased each to 1 b.i.d.     through Sunday following her chemotherapy with the first 3 cycles     because of the sugar increase. 3. Myocardial infarction in September 2010. 4. History of hypertension. 5. History of appendectomy. 6. Laparoscopic cholecystectomy 1998. 7. Coronary artery bypass graft in September of 2002. 8. Nonthyroid mass from the neck removed in 1982 that was benign. 9. Benign left breast biopsy in 1984.  She is here today having completed radiation therapy.  She did have some complicating diarrhea from the external beam therapy but did very, very well she states with the intracavitary radiation.  She looks very good today.  Her vital signs show that her weight is up to 160 pounds.  She has gained 8 pounds compared to 01/08/2011.  Other vital signs are excellent.  Her physical examination shows lymph nodes that are negative throughout.  Her abdomen is soft, nontender, without ascites, without organomegaly.  Bowel sounds are normal.  She has no peripheral edema of the arms or legs.  She has an intact Port-A-Catheter.  Heart shows a regular rhythm and rate without murmur, rub or gallop.  Lungs are clear.  So we will increase her glipizide and metformin again to what we did before.  Her sugar was 394 this morning.  CBC was  excellent.  We will see her in 4 weeks.  We will get her through these next 3 cycles.  She is then to see Dr. Duard Brady and I suspect Telford Nab, RN will call her with an appointment in the near future.    ______________________________ Ladona Horns. Mariel Sleet, MD ESN/MEDQ  D:  02/18/2011  T:  02/18/2011  Job:  [id number]

## 2011-02-18 NOTE — Progress Notes (Signed)
Labs drawn today for cbc/diff,cmp 

## 2011-02-18 NOTE — Patient Instructions (Signed)
[name]  [id number] 1931/01/20  [facility]  Discharge Instructions  RECOMMENDATIONS MADE BY THE CONSULTANT AND ANY TEST RESULTS WILL BE SENT TO YOUR REFERRING DOCTOR.   EXAM FINDINGS BY MD TODAY AND SIGNS AND SYMPTOMS TO REPORT TO CLINIC OR PRIMARY MD: You are doing well.   MEDICATIONS PRESCRIBED: none   INSTRUCTIONS GIVEN AND DISCUSSED: Other :  Increase your glucotrol to 2 pills twice daily, day of chemotherapy and for 2 days following. Monitor your blood sugar at home.  If levels get too low let us know.  SPECIAL INSTRUCTIONS/FOLLOW-UP: Return to Clinic on :  See Schedule   I acknowledge that I have been informed and understand all the instructions given to me and received a copy. I do not have any more questions at this time, but understand that I may call the Specialty Clinic at [facility] at [phone] during business hours should I have any further questions or need assistance in obtaining follow-up care.    __________________________________________  _____________  __________ Signature of Patient or Authorized Representative            Date                   Time    __________________________________________ Nurse's Signature

## 2011-02-18 NOTE — Progress Notes (Signed)
Infusion completed, patient tolerated well.

## 2011-02-20 ENCOUNTER — Encounter: Payer: Self-pay | Admitting: Cardiology

## 2011-02-20 DIAGNOSIS — N289 Disorder of kidney and ureter, unspecified: Secondary | ICD-10-CM | POA: Insufficient documentation

## 2011-02-20 DIAGNOSIS — I4891 Unspecified atrial fibrillation: Secondary | ICD-10-CM | POA: Insufficient documentation

## 2011-02-20 DIAGNOSIS — Z951 Presence of aortocoronary bypass graft: Secondary | ICD-10-CM | POA: Insufficient documentation

## 2011-02-20 DIAGNOSIS — IMO0002 Reserved for concepts with insufficient information to code with codable children: Secondary | ICD-10-CM | POA: Insufficient documentation

## 2011-02-20 DIAGNOSIS — E8770 Fluid overload, unspecified: Secondary | ICD-10-CM | POA: Insufficient documentation

## 2011-02-20 DIAGNOSIS — I2510 Atherosclerotic heart disease of native coronary artery without angina pectoris: Secondary | ICD-10-CM | POA: Insufficient documentation

## 2011-02-20 DIAGNOSIS — R9430 Abnormal result of cardiovascular function study, unspecified: Secondary | ICD-10-CM | POA: Insufficient documentation

## 2011-02-21 ENCOUNTER — Encounter: Payer: Self-pay | Admitting: Cardiology

## 2011-02-21 ENCOUNTER — Ambulatory Visit (INDEPENDENT_AMBULATORY_CARE_PROVIDER_SITE_OTHER): Payer: Medicare Other | Admitting: Cardiology

## 2011-02-21 VITALS — BP 135/84 | HR 66 | Ht 65.0 in | Wt 156.0 lb

## 2011-02-21 DIAGNOSIS — I10 Essential (primary) hypertension: Secondary | ICD-10-CM

## 2011-02-21 DIAGNOSIS — I4891 Unspecified atrial fibrillation: Secondary | ICD-10-CM

## 2011-02-21 DIAGNOSIS — I2510 Atherosclerotic heart disease of native coronary artery without angina pectoris: Secondary | ICD-10-CM

## 2011-02-21 NOTE — Assessment & Plan Note (Signed)
The patient had a Maze procedure with her CABG in September, 2010.  Amiodarone and Coumadin were eventually stopped.  She's holding sinus rhythm.  No change in therapy.

## 2011-02-21 NOTE — Patient Instructions (Signed)
Your physician you to follow up in 1 year. You will receive a reminder letter in the mail one-two months in advance. If you don't receive a letter, please call our office to schedule the follow-up appointment. Your physician recommends that you continue on your current medications as directed. Please refer to the Current Medication list given to you today. 

## 2011-02-21 NOTE — Progress Notes (Signed)
HPI Patient is seen for cardiology followup.  I saw her one year ago.  In the meantime she has a diagnosis of endometrial cancer.  This is being treated with radiation and chemotherapy.  She is doing well with it.  She looks good today.  She's not had chest pain.  As part of today's evaluation I have the old records and completely updated her new electronic medical record  Allergies  Allergen Reactions  . Statins     REACTION: fatique, no energy    Current Outpatient Prescriptions  Medication Sig Dispense Refill  . amLODipine (NORVASC) 5 MG tablet Take 5 mg by mouth daily.        Marland Kitchen aspirin 81 MG tablet Take 81 mg by mouth daily.        . calcium gluconate 500 MG tablet Take 500 mg by mouth 2 (two) times daily.        . Cholecalciferol (VITAMIN D PO) Take 400 Units by mouth daily.        Marland Kitchen dexamethasone (DECADRON) 4 MG tablet Take 8 mg by mouth. Take 12-6 hrs. Prior to Taxol.         . Diphenhydramine-APAP, sleep, (TYLENOL PM EXTRA STRENGTH PO) Take by mouth as needed.        . diphenoxylate-atropine (LOMOTIL) 2.5-0.025 MG per tablet Take 1 tablet by mouth 4 (four) times daily as needed.        . fish oil-omega-3 fatty acids 1000 MG capsule Take 1 g by mouth 2 (two) times daily.       . Flaxseed, Linseed, (FLAXSEED OIL) 1200 MG CAPS Take 1,200 mg by mouth daily.        . furosemide (LASIX) 40 MG tablet Take 40 mg by mouth daily.        Marland Kitchen glipiZIDE (GLUCOTROL) 5 MG tablet Take 5 mg by mouth daily.       . Glucosamine 500 MG CAPS Take 500 mg by mouth 2 (two) times daily.        Marland Kitchen LORazepam (ATIVAN) 1 MG tablet Take 1 mg by mouth as needed. Take q 3-4 hrs prn n&v       . metFORMIN (GLUCOPHAGE) 500 MG tablet Take 500 mg by mouth at bedtime.       . metoprolol tartrate (LOPRESSOR) 25 MG tablet Take 12.5 mg by mouth 2 (two) times daily.        . ondansetron (ZOFRAN) 8 MG tablet Take 8 mg by mouth every 8 (eight) hours as needed.        . potassium chloride (KLOR-CON) 20 MEQ packet Take 20  mEq by mouth daily.        . prochlorperazine (COMPAZINE) 25 MG suppository Place 25 mg rectally every 6 (six) hours. For n&v       . Red Yeast Rice 600 MG TABS Take 1,200 mg by mouth daily.         History   Social History  . Marital Status: Widowed    Spouse Name: N/A    Number of Children: N/A  . Years of Education: N/A   Occupational History  . Retired    Social History Main Topics  . Smoking status: Never Smoker   . Smokeless tobacco: Never Used  . Alcohol Use: No  . Drug Use: No  . Sexually Active: Not on file   Other Topics Concern  . Not on file   Social History Narrative  . No narrative on file  Family History  Problem Relation Age of Onset  . Hypertension Mother   . Diabetes Other   . Hypertension Other     Past Medical History  Diagnosis Date  . Hx of CABG     2010, Dr.Van Trigt, Maze procedure, ligation of left atrial appendage, Coumadin stopped January, 2011  . HTN (hypertension)   . Endometrial ca 09/25/2010  . Coronary artery disease   . Hyperlipidemia   . Noninsulin dependent diabetes mellitus   . Osteoarthritis     Severe  . Fluid overload   . Renal insufficiency     Mild with higher dose diuretic  . Ejection fraction     EF 60%, echo, September, 2010  . Atrial fibrillation     Maze procedure September, 2010, amiodarone and Coumadin stopped eventually January, 2011  . Shortness of breath     Past Surgical History  Procedure Date  . Appendectomy 60 yrs ago  . Tonsilectomy, adenoidectomy, bilateral myringotomy and tubes   . Breast mass excision     left breast 1985  . Tumor removal     from throat  benign  . Choleycystectomy 1998  . Cardiac surgery 2010    triple bypass  . Abdominal hysterectomy     complete  . Coronary artery bypass graft 12/21/08    Donata Clay; LIMA LAD, SVG posterior descending, SVG OM, maze procedure, ligation left atrial appendage  . Cardiac catheterization   . Cataract extraction   . Portacath placement       ROS   Patient denies fever, chills, headache, sweats, rash, change in vision, change in hearing, chest pain, cough, nausea vomiting, urinary symptoms.  All other systems are reviewed and are negative.  PHYSICAL EXAM     Patient looks good today.  There is no jugular venous distention.  There is no xanthelasma.  Lungs are clear.  Respiratory effort is nonlabored.  Cardiac exam S1 and S2.  There are no clicks or significant murmurs.  The abdomen is soft there is no peripheral edema.  There are no musculoskeletal disease.  There is no skin rashes.  Filed Vitals:   02/21/11 0938  BP: 135/84  Pulse: 66  Height: 5\' 5"  (1.651 m)  Weight: 156 lb (70.761 kg)    EKG   EKG is done today and reviewed by me.  She does have sinus rhythm.  There is no significant change in her EKG.  ASSESSMENT & PLAN

## 2011-02-21 NOTE — Assessment & Plan Note (Signed)
Blood pressure stable. No change in therapy. 

## 2011-02-21 NOTE — Assessment & Plan Note (Signed)
Coronary disease is stable. No change in therapy. 

## 2011-03-11 ENCOUNTER — Encounter (HOSPITAL_COMMUNITY): Payer: Medicare Other | Attending: Oncology

## 2011-03-11 ENCOUNTER — Other Ambulatory Visit (HOSPITAL_COMMUNITY): Payer: Self-pay | Admitting: Oncology

## 2011-03-11 ENCOUNTER — Encounter (HOSPITAL_BASED_OUTPATIENT_CLINIC_OR_DEPARTMENT_OTHER): Payer: Medicare Other

## 2011-03-11 VITALS — BP 123/71 | HR 67 | Temp 97.5°F | Ht 62.5 in | Wt 153.6 lb

## 2011-03-11 DIAGNOSIS — C549 Malignant neoplasm of corpus uteri, unspecified: Secondary | ICD-10-CM

## 2011-03-11 DIAGNOSIS — C541 Malignant neoplasm of endometrium: Secondary | ICD-10-CM

## 2011-03-11 DIAGNOSIS — Z5111 Encounter for antineoplastic chemotherapy: Secondary | ICD-10-CM

## 2011-03-11 LAB — CBC
HCT: 29.8 % — ABNORMAL LOW (ref 36.0–46.0)
MCH: 30.9 pg (ref 26.0–34.0)
MCV: 89.5 fL (ref 78.0–100.0)
Platelets: 180 10*3/uL (ref 150–400)
RBC: 3.33 MIL/uL — ABNORMAL LOW (ref 3.87–5.11)
RDW: 12.2 % (ref 11.5–15.5)
WBC: 2.7 10*3/uL — ABNORMAL LOW (ref 4.0–10.5)

## 2011-03-11 LAB — COMPREHENSIVE METABOLIC PANEL
AST: 28 U/L (ref 0–37)
BUN: 27 mg/dL — ABNORMAL HIGH (ref 6–23)
CO2: 23 mEq/L (ref 19–32)
Calcium: 9.3 mg/dL (ref 8.4–10.5)
Chloride: 95 mEq/L — ABNORMAL LOW (ref 96–112)
Creatinine, Ser: 0.96 mg/dL (ref 0.50–1.10)
GFR calc non Af Amer: 55 mL/min — ABNORMAL LOW (ref >90)
Total Bilirubin: 0.2 mg/dL — ABNORMAL LOW (ref 0.3–1.2)

## 2011-03-11 LAB — DIFFERENTIAL
Basophils Absolute: 0 10*3/uL (ref 0.0–0.1)
Basophils Relative: 0 % (ref 0–1)
Lymphocytes Relative: 8 % — ABNORMAL LOW (ref 12–46)
Neutro Abs: 2.4 10*3/uL (ref 1.7–7.7)
Neutrophils Relative %: 91 % — ABNORMAL HIGH (ref 43–77)

## 2011-03-11 MED ORDER — SODIUM CHLORIDE 0.9 % IV SOLN: 24.0000 mg | Freq: Once | INTRAVENOUS | Status: DC

## 2011-03-11 MED ORDER — CARBOPLATIN CHEMO INJECTION 600 MG/60ML
420.0000 mg | Freq: Once | INTRAVENOUS | Status: AC
  Administered 2011-03-11: 420 mg via INTRAVENOUS
  Filled 2011-03-11: qty 42

## 2011-03-11 MED ORDER — DIPHENHYDRAMINE HCL 50 MG/ML IJ SOLN
INTRAMUSCULAR | Status: AC
  Administered 2011-03-11: 50 mg via INTRAVENOUS
  Filled 2011-03-11: qty 1

## 2011-03-11 MED ORDER — PACLITAXEL CHEMO INJECTION 300 MG/50ML
175.0000 mg/m2 | Freq: Once | INTRAVENOUS | Status: AC
  Administered 2011-03-11: 312 mg via INTRAVENOUS
  Filled 2011-03-11: qty 52

## 2011-03-11 MED ORDER — HEPARIN SOD (PORK) LOCK FLUSH 100 UNIT/ML IV SOLN
500.0000 [IU] | Freq: Once | INTRAVENOUS | Status: AC
  Administered 2011-03-11: 500 [IU]
  Filled 2011-03-11: qty 5

## 2011-03-11 MED ORDER — DIPHENHYDRAMINE HCL 50 MG/ML IJ SOLN
50.0000 mg | Freq: Once | INTRAMUSCULAR | Status: AC
  Administered 2011-03-11: 50 mg via INTRAVENOUS

## 2011-03-11 MED ORDER — FAMOTIDINE IN NACL 20-0.9 MG/50ML-% IV SOLN
20.0000 mg | Freq: Once | INTRAVENOUS | Status: AC
  Administered 2011-03-11: 20 mg via INTRAVENOUS
  Filled 2011-03-11: qty 50

## 2011-03-11 MED ORDER — RANITIDINE HCL 50 MG/2ML IJ SOLN: 50.0000 mg | Freq: Once | INTRAMUSCULAR | Status: DC

## 2011-03-11 MED ORDER — SODIUM CHLORIDE 0.9 % IV SOLN
Freq: Once | INTRAVENOUS | Status: AC
  Administered 2011-03-11: 24 mg via INTRAVENOUS
  Filled 2011-03-11: qty 12

## 2011-03-11 MED ORDER — HEPARIN SOD (PORK) LOCK FLUSH 100 UNIT/ML IV SOLN
INTRAVENOUS | Status: AC
  Administered 2011-03-11: 500 [IU]
  Filled 2011-03-11: qty 5

## 2011-03-11 MED ORDER — SODIUM CHLORIDE 0.9 % IV SOLN
Freq: Once | INTRAVENOUS | Status: AC
  Administered 2011-03-11: 09:00:00 via INTRAVENOUS

## 2011-03-11 MED ORDER — DEXAMETHASONE SODIUM PHOSPHATE 4 MG/ML IJ SOLN: 8.0000 mg | Freq: Once | INTRAMUSCULAR | Status: DC

## 2011-03-11 NOTE — Progress Notes (Signed)
Labs drawn today for cbc/diff,cmp 

## 2011-03-12 ENCOUNTER — Telehealth (HOSPITAL_COMMUNITY): Payer: Self-pay

## 2011-03-12 NOTE — Telephone Encounter (Signed)
Message left to call with any questions or concerns.

## 2011-03-17 ENCOUNTER — Other Ambulatory Visit: Payer: Self-pay | Admitting: Certified Registered Nurse Anesthetist

## 2011-03-18 ENCOUNTER — Encounter (HOSPITAL_BASED_OUTPATIENT_CLINIC_OR_DEPARTMENT_OTHER): Payer: Medicare Other | Admitting: Oncology

## 2011-03-18 ENCOUNTER — Telehealth (HOSPITAL_COMMUNITY): Payer: Self-pay

## 2011-03-18 VITALS — BP 146/79 | HR 68 | Temp 97.8°F | Ht 62.5 in | Wt 153.0 lb

## 2011-03-18 DIAGNOSIS — C549 Malignant neoplasm of corpus uteri, unspecified: Secondary | ICD-10-CM

## 2011-03-18 DIAGNOSIS — C541 Malignant neoplasm of endometrium: Secondary | ICD-10-CM

## 2011-03-18 DIAGNOSIS — E119 Type 2 diabetes mellitus without complications: Secondary | ICD-10-CM

## 2011-03-18 LAB — BASIC METABOLIC PANEL
BUN: 25 mg/dL — ABNORMAL HIGH (ref 6–23)
CO2: 28 mEq/L (ref 19–32)
Chloride: 96 mEq/L (ref 96–112)
GFR calc Af Amer: 71 mL/min — ABNORMAL LOW (ref >90)
Glucose, Bld: 91 mg/dL (ref 70–99)
Potassium: 4.4 mEq/L (ref 3.5–5.1)

## 2011-03-18 NOTE — Progress Notes (Signed)
This office note has been dictated.

## 2011-03-18 NOTE — Patient Instructions (Signed)
[name]  [id number] 1930/06/05  [facility]  Discharge Instructions  RECOMMENDATIONS MADE BY THE CONSULTANT AND ANY TEST RESULTS WILL BE SENT TO YOUR REFERRING DOCTOR.   EXAM FINDINGS BY MD TODAY AND SIGNS AND SYMPTOMS TO REPORT TO CLINIC OR PRIMARY MD: Need to drink 8 - 10 glasses water today and tomorrow.  You may be a little dehydrated.  We also need to see what your blood sugar and other electrolytes are today  MEDICATIONS PRESCRIBED: none   INSTRUCTIONS GIVEN AND DISCUSSED:  Report uncontrolled nausea, vomiting, diarrhea, fevers, etc.  SPECIAL INSTRUCTIONS/FOLLOW-UP: Xray Studies Needed  In March, Return to Clinic 1 st week in January to see either the PA or Dr. Mariel Sleet and Other (Referral/Appointments) Dr. Mariel Sleet in March and  Dr. Grant Ruts / Telford Nab in April after CT Scans    I acknowledge that I have been informed and understand all the instructions given to me and received a copy. I do not have any more questions at this time, but understand that I may call the Specialty Clinic at [facility] at [phone] during business hours should I have any further questions or need assistance in obtaining follow-up care.    __________________________________________  _____________  __________ Signature of Patient or Authorized Representative            Date                   Time    __________________________________________ Nurse's Signature

## 2011-03-18 NOTE — Telephone Encounter (Signed)
Message left for patient that she is dehydrated and needs to drink at least 8 - 10 glasses (8oz.) per day for next 4 - 5 days.

## 2011-03-18 NOTE — Progress Notes (Signed)
CC:   Lindsay Hopping, MD Bernita Buffy. Duard Brady, MD Telford Nab, R.N. Billie Lade, Ph.D., M.D.  DIAGNOSIS: 1. Stage IIIC endometrial adenocarcinoma with 1 of 23 positive lymph     nodes, with LVI and deep uterine muscle invasion, giving her the     stage IIIC mentioned above.  Washings were negative however.     Surgery took place on 07/30/2010, consisting of debulking surgery,     complete hysterectomy, and bilateral salpingo-oophorectomy.  She     has been treated with 3 cycles of carboplatin and Taxol pre-     radiation to the pelvis, followed by radiation to the pelvis     completed by Dr. Roselind Messier.  She received both external beam radiation     therapy consisting of 4500 cGy, and then intracavitary     brachytherapy.  She is now receiving her post-radiation     chemotherapy.  She has had 2 out of planned 3 cycles and will     receive her 6th and final cycle of chemotherapy on the 27th of     December. 2. Non-insulin-dependent diabetes mellitus. 3. Weakness and fatigue, worse with this cycle of therapy. 4. Myocardial infarction in September 2010. 5. History of hypertension. 6. History of appendectomy. 7. Laparoscopic cholecystectomy in 1998. 8. Coronary artery bypass grafting in September 2002. 9. Left benign breast biopsy in 1984. 10.Benign neck tumor resection years ago. [name] is weak and tired but she has had, of course, 5 cycles of chemotherapy, extensive debulking surgery and, of course, radiation therapy.  What she forgot to do this past cycle, however, was to increase her diabetic medications as she had done with 4, which went very very smoothly.  Her sugar actually got up to 357.  Yesterday, however, it was back down to less than 110, but she was increasing her metformin and her glipizide with the other cycles; she just forgot to do it.  She will remember to do it this next cycle she states unequivocally.  She has moisture to her tongue and mouth, and she has adequate  vital signs, but her blood pressure is still quite good for her.  Pulse is still the same.  She states that she has recovered most of her fatigue compared to the weekend; but still has some, without question, worse than in the past. Her weight is still the same as it was last week but it is down from a month ago, 7 pounds.  Her BMI is 27, blood pressure 146/79 today.  She is not having any diarrhea.  No GU symptoms other than when she was feeling so "lifeless" she states that she was urinating all the time.  I suspect that was the hyperglycemia.  She has not had nausea or vomiting which is excellent.  She has some numbness in her fingers and toes, but has no trouble walking, no trouble really picking up objects.  PHYSICAL EXAMINATION:  Otherwise shows that her lungs are clear today to auscultation and percussion.  She has no adenopathy.  The Port-A-Cath is still intact in the left upper chest wall.  The heart shows a regular rhythm and rate without murmur, rub or gallop.  The abdomen is soft. Good bowel sounds.  No organomegaly.  No ascites.  No masses.  RECOMMENDATIONS:  What I plan on doing is treating her on the 27th with cycle 6.  We will get a BMET today just to make sure her BUN and creatinine are okay, and her electrolytes,  and we will schedule her CT scan for the last week in March, of the abdomen and pelvis, with contrast, and then get her an appointment with Dr. Cleda Mccreedy and Telford Nab, R.N. at the GYN Clinic in the [facility] in [place].  We will see her back in 3 weeks or 4 weeks either way, to make sure she is doing okay with cycle 6.  I will see her after Dr. Duard Brady in late April.    ______________________________ Ladona Horns. Mariel Sleet, MD ESN/MEDQ  D:  03/18/2011  T:  03/18/2011  Job:  [id number]

## 2011-04-03 ENCOUNTER — Encounter (HOSPITAL_BASED_OUTPATIENT_CLINIC_OR_DEPARTMENT_OTHER): Payer: Medicare Other

## 2011-04-03 ENCOUNTER — Encounter (HOSPITAL_COMMUNITY): Payer: Medicare Other

## 2011-04-03 VITALS — BP 147/73 | HR 75 | Temp 96.8°F | Ht 62.5 in | Wt 155.2 lb

## 2011-04-03 DIAGNOSIS — C541 Malignant neoplasm of endometrium: Secondary | ICD-10-CM

## 2011-04-03 DIAGNOSIS — C549 Malignant neoplasm of corpus uteri, unspecified: Secondary | ICD-10-CM

## 2011-04-03 LAB — DIFFERENTIAL
Basophils Relative: 0 % (ref 0–1)
Lymphocytes Relative: 8 % — ABNORMAL LOW (ref 12–46)
Monocytes Absolute: 0 10*3/uL — ABNORMAL LOW (ref 0.1–1.0)
Monocytes Relative: 2 % — ABNORMAL LOW (ref 3–12)
Neutro Abs: 2.4 10*3/uL (ref 1.7–7.7)

## 2011-04-03 LAB — COMPREHENSIVE METABOLIC PANEL
BUN: 29 mg/dL — ABNORMAL HIGH (ref 6–23)
CO2: 21 mEq/L (ref 19–32)
Chloride: 97 mEq/L (ref 96–112)
Creatinine, Ser: 0.84 mg/dL (ref 0.50–1.10)
GFR calc non Af Amer: 65 mL/min — ABNORMAL LOW (ref >90)
Total Bilirubin: 0.3 mg/dL (ref 0.3–1.2)

## 2011-04-03 LAB — CBC
HCT: 24.5 % — ABNORMAL LOW (ref 36.0–46.0)
Hemoglobin: 8.6 g/dL — ABNORMAL LOW (ref 12.0–15.0)
MCHC: 35.1 g/dL (ref 30.0–36.0)

## 2011-04-03 MED ORDER — SODIUM CHLORIDE 0.9 % IJ SOLN
10.0000 mL | INTRAMUSCULAR | Status: DC | PRN
  Administered 2011-04-03: 10 mL
  Filled 2011-04-03: qty 10

## 2011-04-03 MED ORDER — HEPARIN SOD (PORK) LOCK FLUSH 100 UNIT/ML IV SOLN
INTRAVENOUS | Status: AC
  Filled 2011-04-03: qty 5

## 2011-04-03 MED ORDER — SODIUM CHLORIDE 0.9 % IV SOLN
Freq: Once | INTRAVENOUS | Status: AC
  Administered 2011-04-03: 09:00:00 via INTRAVENOUS

## 2011-04-03 MED ORDER — HEPARIN SOD (PORK) LOCK FLUSH 100 UNIT/ML IV SOLN
500.0000 [IU] | Freq: Once | INTRAVENOUS | Status: DC
  Filled 2011-04-03: qty 5

## 2011-04-03 NOTE — Progress Notes (Signed)
Chemo held due top platelets 66000

## 2011-04-09 ENCOUNTER — Ambulatory Visit (HOSPITAL_COMMUNITY): Payer: Medicare Other | Admitting: Oncology

## 2011-04-10 ENCOUNTER — Encounter (HOSPITAL_COMMUNITY): Payer: Medicare Other | Attending: Oncology

## 2011-04-10 ENCOUNTER — Encounter (HOSPITAL_BASED_OUTPATIENT_CLINIC_OR_DEPARTMENT_OTHER): Payer: Medicare Other | Admitting: Oncology

## 2011-04-10 ENCOUNTER — Other Ambulatory Visit (HOSPITAL_COMMUNITY): Payer: Self-pay | Admitting: Oncology

## 2011-04-10 ENCOUNTER — Encounter (HOSPITAL_COMMUNITY): Payer: Self-pay | Admitting: Oncology

## 2011-04-10 ENCOUNTER — Encounter (HOSPITAL_BASED_OUTPATIENT_CLINIC_OR_DEPARTMENT_OTHER): Payer: Medicare Other

## 2011-04-10 VITALS — BP 137/76 | HR 69 | Temp 97.9°F | Ht 62.5 in | Wt 158.0 lb

## 2011-04-10 VITALS — BP 150/79 | HR 72 | Temp 97.5°F | Wt 158.4 lb

## 2011-04-10 DIAGNOSIS — C549 Malignant neoplasm of corpus uteri, unspecified: Secondary | ICD-10-CM

## 2011-04-10 DIAGNOSIS — C775 Secondary and unspecified malignant neoplasm of intrapelvic lymph nodes: Secondary | ICD-10-CM

## 2011-04-10 DIAGNOSIS — C541 Malignant neoplasm of endometrium: Secondary | ICD-10-CM

## 2011-04-10 DIAGNOSIS — Z5111 Encounter for antineoplastic chemotherapy: Secondary | ICD-10-CM

## 2011-04-10 LAB — DIFFERENTIAL
Basophils Absolute: 0 10*3/uL (ref 0.0–0.1)
Basophils Relative: 0 % (ref 0–1)
Eosinophils Relative: 0 % (ref 0–5)
Monocytes Absolute: 0 10*3/uL — ABNORMAL LOW (ref 0.1–1.0)
Monocytes Relative: 1 % — ABNORMAL LOW (ref 3–12)

## 2011-04-10 LAB — CBC
HCT: 26.8 % — ABNORMAL LOW (ref 36.0–46.0)
Hemoglobin: 9.3 g/dL — ABNORMAL LOW (ref 12.0–15.0)
MCH: 31.5 pg (ref 26.0–34.0)
MCHC: 34.7 g/dL (ref 30.0–36.0)
MCV: 90.8 fL (ref 78.0–100.0)
RDW: 15.7 % — ABNORMAL HIGH (ref 11.5–15.5)

## 2011-04-10 MED ORDER — SODIUM CHLORIDE 0.9 % IJ SOLN
10.0000 mL | INTRAMUSCULAR | Status: DC | PRN
  Filled 2011-04-10: qty 10

## 2011-04-10 MED ORDER — DIPHENHYDRAMINE HCL 50 MG/ML IJ SOLN
50.0000 mg | Freq: Once | INTRAMUSCULAR | Status: AC
  Administered 2011-04-10: 50 mg via INTRAVENOUS

## 2011-04-10 MED ORDER — HEPARIN SOD (PORK) LOCK FLUSH 100 UNIT/ML IV SOLN
INTRAVENOUS | Status: AC
  Filled 2011-04-10: qty 5

## 2011-04-10 MED ORDER — RANITIDINE HCL 50 MG/2ML IJ SOLN: 50.0000 mg | Freq: Once | INTRAMUSCULAR | Status: DC

## 2011-04-10 MED ORDER — DEXAMETHASONE SODIUM PHOSPHATE 4 MG/ML IJ SOLN: 8.0000 mg | Freq: Once | INTRAMUSCULAR | Status: DC

## 2011-04-10 MED ORDER — PACLITAXEL CHEMO INJECTION 300 MG/50ML
175.0000 mg/m2 | Freq: Once | INTRAVENOUS | Status: AC
  Administered 2011-04-10: 312 mg via INTRAVENOUS
  Filled 2011-04-10: qty 52

## 2011-04-10 MED ORDER — CARBOPLATIN CHEMO INJECTION 600 MG/60ML
420.0000 mg | Freq: Once | INTRAVENOUS | Status: AC
  Administered 2011-04-10: 420 mg via INTRAVENOUS
  Filled 2011-04-10: qty 42

## 2011-04-10 MED ORDER — COLD PACK MISC ONCOLOGY
1.0000 | Freq: Once | Status: DC | PRN
  Filled 2011-04-10: qty 1

## 2011-04-10 MED ORDER — SODIUM CHLORIDE 0.9 % IV SOLN
Freq: Once | INTRAVENOUS | Status: AC
  Administered 2011-04-10: 24 mg via INTRAVENOUS
  Filled 2011-04-10: qty 12

## 2011-04-10 MED ORDER — SODIUM CHLORIDE 0.9 % IV SOLN: 24.0000 mg | Freq: Once | INTRAVENOUS | Status: DC

## 2011-04-10 MED ORDER — SODIUM CHLORIDE 0.9 % IV SOLN
Freq: Once | INTRAVENOUS | Status: AC
  Administered 2011-04-10: 10:00:00 via INTRAVENOUS

## 2011-04-10 MED ORDER — HEPARIN SOD (PORK) LOCK FLUSH 100 UNIT/ML IV SOLN
500.0000 [IU] | Freq: Once | INTRAVENOUS | Status: AC
  Administered 2011-04-10: 500 [IU]
  Filled 2011-04-10: qty 5

## 2011-04-10 MED ORDER — FAMOTIDINE IN NACL 20-0.9 MG/50ML-% IV SOLN
INTRAVENOUS | Status: AC
  Filled 2011-04-10: qty 50

## 2011-04-10 MED ORDER — FAMOTIDINE IN NACL 20-0.9 MG/50ML-% IV SOLN
20.0000 mg | Freq: Once | INTRAVENOUS | Status: AC
  Administered 2011-04-10: 20 mg via INTRAVENOUS

## 2011-04-10 MED ORDER — DIPHENHYDRAMINE HCL 50 MG/ML IJ SOLN
INTRAMUSCULAR | Status: AC
  Filled 2011-04-10: qty 1

## 2011-04-10 NOTE — Progress Notes (Signed)
[name],Lindsay A, MD No address on file  1. Endometrial ca     CURRENT THERAPY:Surgery took place on 07/30/2010, consisting of debulking surgery, complete hysterectomy, and bilateral salpingo-oophorectomy. She has been treated with 3 cycles of carboplatin and Taxol pre- radiation to the pelvis, followed by radiation to the pelvis completed by Dr. Roselind Messier. She received both external beam radiation therapy consisting of 4500 cGy, and then intracavitary brachytherapy. She is now receiving her post-radiation chemotherapy. She has had 2 out of planned 3 cycles and will receive her 6th and final cycle of chemotherapy today.  Her last cycle scheduled for the end of December was held due thrombocytopenia.   INTERVAL HISTORY: [name] 78 y.o. female returns for  regular  visit for followup of  Stage IIIC endometrial adenocarcinoma with 1 of 23 positive lymph nodes, with LVI and deep uterine muscle invasion.  Washings were negative however.   The patient is here today for follow-up.  She is due for her 6th and final chemotherapy administration today.  She was held last week due to thrombocytopenia.  Her counts today are adequate therefore she will being going to the chemotherapy room following our visit today.  I personally reviewed and went over laboratory results with the patient.  She understands that her blood counts are adequate for chemotherapy today.  She is pleased with this information.  The patient reports that she had Lee nice Holiday season.  She felt well.  She denies any nausea and vomiting.  Her appetite has improved and she is happy to report that the other day she had Lee full glass of milk which in the past tasted poor.  For Christmas, she cooked the Ham and Pecan pie and the family members brought Lee dish.  She reports that they have done this in years past and it worked out nicely for her this year.  There was not strenuous work for her.    She is taking her glipizide as ordered.    The  patient is due for Lee CT scan at end of March 2013 and then she has Lee follow-up appointment one day following with Dr. Mariel Sleet.  She then has Lee follow-up appointment with Dr. Duard Brady in April 2013.   Past Medical History  Diagnosis Date  . Hx of CABG     2010, Dr.Van Trigt, Maze procedure, ligation of left atrial appendage, Coumadin stopped January, 2011  . HTN (hypertension)   . Endometrial ca 09/25/2010    June, 2012, radiation and chemotherapy, Dr. Mariel Sleet  . Coronary artery disease   . Hyperlipidemia   . Noninsulin dependent diabetes mellitus   . Osteoarthritis     Severe  . Fluid overload   . Renal insufficiency     Mild with higher dose diuretic  . Ejection fraction     EF 60%, echo, September, 2010  . Campath-induced atrial fibrillation     Maze procedure September, 2010, amiodarone and Coumadin stopped eventually January, 2011  . Shortness of breath     has DM; HYPERLIPIDEMIA; HYPERTENSION; RENAL INSUFFICIENCY; SHORTNESS OF BREATH; Endometrial ca; Hx of CABG; HTN (hypertension); Coronary artery disease; Hyperlipidemia; Fluid overload; Renal insufficiency; Ejection fraction; and Campath-induced atrial fibrillation on her problem list.     is allergic to statins.  [name] does not currently have medications on file.  Past Surgical History  Procedure Date  . Appendectomy 60 yrs ago  . Tonsilectomy, adenoidectomy, bilateral myringotomy and tubes   . Breast mass excision  left breast 1985  . Tumor removal     from throat  benign  . Choleycystectomy 1998  . Cardiac surgery 2010    triple bypass  . Abdominal hysterectomy     complete  . Coronary artery bypass graft 12/21/08    Donata Clay; LIMA LAD, SVG posterior descending, SVG OM, maze procedure, ligation left atrial appendage  . Cardiac catheterization   . Cataract extraction   . Portacath placement     Denies any headaches, dizziness, double vision, fevers, chills, night sweats, nausea, vomiting, diarrhea,  constipation, chest pain, heart palpitations, shortness of breath, blood in stool, black tarry stool, urinary pain, urinary burning, urinary frequency, hematuria.   PHYSICAL EXAMINATION  ECOG PERFORMANCE STATUS: 1 - Symptomatic but completely ambulatory  Filed Vitals:   04/10/11 0914  BP: 150/79  Pulse: 72  Temp: 97.5 F (36.4 C)    GENERAL:alert, no distress, well nourished, well developed, comfortable, cooperative, smiling and pleased with the completion of chemotherapy today. SKIN: skin color, texture, turgor are normal HEAD: Normocephalic EYES: normal EARS: External ears normal OROPHARYNX:mucous membranes are moist  NECK: supple, no adenopathy, thyroid normal size, non-tender, without nodularity, no stridor, non-tender, trachea midline LYMPH:  no palpable lymphadenopathy, no hepatosplenomegaly BREAST:not examined LUNGS: clear to auscultation and percussion HEART: regular rate & rhythm, no murmurs, no gallops, S1 normal and S2 normal ABDOMEN:abdomen soft, non-tender, normal bowel sounds and no hepatosplenomegaly BACK: Back symmetric, no curvature., No CVA tenderness EXTREMITIES:less then 2 second capillary refill, no joint deformities, effusion, or inflammation, no edema, no skin discoloration, no clubbing, no cyanosis  NEURO: alert & oriented x 3 with fluent speech, no focal motor/sensory deficits, gait normal    LABORATORY DATA: CBC    Component Value Date/Time   WBC 3.7* 04/10/2011 0852   RBC 2.95* 04/10/2011 0852   HGB 9.3* 04/10/2011 0852   HCT 26.8* 04/10/2011 0852   PLT 117* 04/10/2011 0852   MCV 90.8 04/10/2011 0852   MCH 31.5 04/10/2011 0852   MCHC 34.7 04/10/2011 0852   RDW 15.7* 04/10/2011 0852   LYMPHSABS 0.3* 04/10/2011 0852   MONOABS 0.0* 04/10/2011 0852   EOSABS 0.0 04/10/2011 0852   BASOSABS 0.0 04/10/2011 0852      Chemistry      Component Value Date/Time   NA 133* 04/03/2011 0830   K 4.1 04/03/2011 0830   CL 97 04/03/2011 0830   CO2 21 04/03/2011 0830   BUN 29*  04/03/2011 0830   CREATININE 0.84 04/03/2011 0830      Component Value Date/Time   CALCIUM 9.5 04/03/2011 0830   ALKPHOS 93 04/03/2011 0830   AST 18 04/03/2011 0830   ALT 11 04/03/2011 0830   BILITOT 0.3 04/03/2011 0830      PATHOLOGY: 1. Peritoneal washings- reactive mesothelial cells present  2. Lymph nodes, regional resection, right para aortic- 0/9 positive nodes  3. Lymph nodes, regional resection, left para aortic- 0/2 lymph nodes and abundant fibroadipose tissue, negative metastatic carcinoma  4. Uterus +/- tubes/ovaries, neoplastic- Invasive endometrioid carcinoma, invading into the outer half of the myometrium with associated angiolymphatic invasion. Cervix- benign squamous mucosa and endocervical mucosa, no dysplasia or malignancy. Bilateral ovaries- benign ovarian tissue with endosalpingiosis, no endometriosis, atypia, or malignancy. Bilateral fallopian tubes- benign fallopian tube tissue, no atypia or malignancy.  5. Lymph nodes, regional resection, right pelvic- 0/7 positive lymph nodes  6. Lymph nodes, regional resection, left pelvis- 1/5 positive lymph nodes for metastatic disease.       ASSESSMENT:  1. Stage IIIC endometrial adenocarcinoma with 1 of 23 positive lymph nodes, with LVI and deep uterine muscle invasion, giving her the stage IIIC mentioned above. Washings were negative however. Surgery took place on 07/30/2010, consisting of debulking surgery, complete hysterectomy, and bilateral salpingo-oophorectomy. She  has been treated with 3 cycles of carboplatin and Taxol pre-  radiation to the pelvis, followed by radiation to the pelvis  completed by Dr. Roselind Messier. She received both external beam radiation therapy consisting of 4500 cGy, and then intracavitary brachytherapy. She is now receiving her post-radiation chemotherapy. She has had 2 out of planned 3 cycles and will receive her 6th and final cycle of chemotherapy today.  Her last cycle scheduled for the end of  December was held due thrombocytopenia. 2. Non-insulin-dependent diabetes mellitus.  3. Weakness and fatigue, worse with this cycle of therapy.  4. Myocardial infarction in September 2010.  5. History of hypertension.  6. History of appendectomy.  7. Laparoscopic cholecystectomy in 1998.  8. Coronary artery bypass grafting in September 2002.  9. Left benign breast biopsy in 1984.  10.Benign neck tumor resection years ago.   PLAN:  1. Lab work today: CBC diff to verify that the patient blood counts meet parameter for chemotherapy treatment.  2. CT scan ordered for March 2013.   3. The patient will then follow-up with Dr. Mariel Sleet as scheduled following the CT scan. 4. She then will follow-up with Dr. Duard Brady in April 2013 for follow-up. 5. I personally reviewed and went over laboratory results with the patient. 6. Return as scheduled in March 2013 following CT scan to see Dr. Mariel Sleet.  7. The patient's CBC met parameters necessary for chemotherapy administration.  She will move on with her 6th and final round of chemotherapy today.   All questions were answered. The patient knows to call the clinic with any problems, questions or concerns. We can certainly see the patient much sooner if necessary.   Ellyana Crigler

## 2011-04-10 NOTE — Progress Notes (Signed)
Labs drawn today for cbc/diff 

## 2011-05-23 ENCOUNTER — Encounter (HOSPITAL_COMMUNITY): Payer: Medicare Other | Attending: Oncology

## 2011-05-23 DIAGNOSIS — Z452 Encounter for adjustment and management of vascular access device: Secondary | ICD-10-CM

## 2011-05-23 DIAGNOSIS — C549 Malignant neoplasm of corpus uteri, unspecified: Secondary | ICD-10-CM | POA: Insufficient documentation

## 2011-05-23 DIAGNOSIS — C541 Malignant neoplasm of endometrium: Secondary | ICD-10-CM

## 2011-05-23 MED ORDER — HEPARIN SOD (PORK) LOCK FLUSH 100 UNIT/ML IV SOLN
INTRAVENOUS | Status: AC
  Administered 2011-05-23: 500 [IU] via INTRAVENOUS
  Filled 2011-05-23: qty 5

## 2011-05-23 MED ORDER — SODIUM CHLORIDE 0.9 % IJ SOLN
10.0000 mL | INTRAMUSCULAR | Status: DC | PRN
  Administered 2011-05-23: 10 mL via INTRAVENOUS
  Filled 2011-05-23: qty 10

## 2011-05-23 MED ORDER — HEPARIN SOD (PORK) LOCK FLUSH 100 UNIT/ML IV SOLN
500.0000 [IU] | Freq: Once | INTRAVENOUS | Status: AC
  Administered 2011-05-23: 500 [IU] via INTRAVENOUS
  Filled 2011-05-23: qty 5

## 2011-05-23 MED ORDER — SODIUM CHLORIDE 0.9 % IJ SOLN
INTRAMUSCULAR | Status: AC
  Administered 2011-05-23: 10 mL via INTRAVENOUS
  Filled 2011-05-23: qty 10

## 2011-06-27 ENCOUNTER — Encounter (HOSPITAL_COMMUNITY): Payer: Medicare Other | Attending: Oncology

## 2011-06-27 DIAGNOSIS — C549 Malignant neoplasm of corpus uteri, unspecified: Secondary | ICD-10-CM | POA: Insufficient documentation

## 2011-06-27 LAB — BASIC METABOLIC PANEL
BUN: 26 mg/dL — ABNORMAL HIGH (ref 6–23)
CO2: 29 mEq/L (ref 19–32)
Chloride: 100 mEq/L (ref 96–112)
Creatinine, Ser: 0.9 mg/dL (ref 0.50–1.10)
GFR calc Af Amer: 69 mL/min — ABNORMAL LOW (ref >90)
Glucose, Bld: 165 mg/dL — ABNORMAL HIGH (ref 70–99)
Potassium: 4.2 mEq/L (ref 3.5–5.1)

## 2011-06-27 NOTE — Progress Notes (Signed)
Labs drawn today for bmp 

## 2011-06-30 ENCOUNTER — Other Ambulatory Visit (HOSPITAL_COMMUNITY): Payer: Medicare Other

## 2011-07-01 ENCOUNTER — Ambulatory Visit (HOSPITAL_COMMUNITY)
Admission: RE | Admit: 2011-07-01 | Discharge: 2011-07-01 | Disposition: A | Discharge status: Home / Self Care | Payer: Medicare Other | Source: Ambulatory Visit | Attending: Oncology | Admitting: Oncology

## 2011-07-01 DIAGNOSIS — C541 Malignant neoplasm of endometrium: Secondary | ICD-10-CM

## 2011-07-01 DIAGNOSIS — Z9221 Personal history of antineoplastic chemotherapy: Secondary | ICD-10-CM | POA: Insufficient documentation

## 2011-07-01 DIAGNOSIS — R933 Abnormal findings on diagnostic imaging of other parts of digestive tract: Secondary | ICD-10-CM | POA: Insufficient documentation

## 2011-07-01 DIAGNOSIS — R9389 Abnormal findings on diagnostic imaging of other specified body structures: Secondary | ICD-10-CM | POA: Insufficient documentation

## 2011-07-01 DIAGNOSIS — C549 Malignant neoplasm of corpus uteri, unspecified: Secondary | ICD-10-CM | POA: Insufficient documentation

## 2011-07-01 MED ORDER — IOHEXOL 300 MG/ML  SOLN
100.0000 mL | Freq: Once | INTRAMUSCULAR | Status: AC | PRN
  Administered 2011-07-01: 100 mL via INTRAVENOUS

## 2011-07-02 ENCOUNTER — Encounter (HOSPITAL_BASED_OUTPATIENT_CLINIC_OR_DEPARTMENT_OTHER): Payer: Medicare Other | Admitting: Oncology

## 2011-07-02 ENCOUNTER — Other Ambulatory Visit (HOSPITAL_COMMUNITY): Payer: Self-pay | Admitting: Oncology

## 2011-07-02 ENCOUNTER — Encounter (HOSPITAL_COMMUNITY): Payer: Medicare Other

## 2011-07-02 ENCOUNTER — Other Ambulatory Visit: Payer: Self-pay | Admitting: Radiology

## 2011-07-02 ENCOUNTER — Ambulatory Visit (HOSPITAL_COMMUNITY)
Admission: RE | Admit: 2011-07-02 | Discharge: 2011-07-02 | Disposition: A | Discharge status: Home / Self Care | Payer: Medicare Other | Source: Ambulatory Visit | Attending: Oncology | Admitting: Oncology

## 2011-07-02 VITALS — BP 148/63 | HR 57 | Temp 96.9°F | Wt 164.4 lb

## 2011-07-02 DIAGNOSIS — M7989 Other specified soft tissue disorders: Secondary | ICD-10-CM | POA: Insufficient documentation

## 2011-07-02 DIAGNOSIS — I898 Other specified noninfective disorders of lymphatic vessels and lymph nodes: Secondary | ICD-10-CM

## 2011-07-02 DIAGNOSIS — C541 Malignant neoplasm of endometrium: Secondary | ICD-10-CM

## 2011-07-02 DIAGNOSIS — C549 Malignant neoplasm of corpus uteri, unspecified: Secondary | ICD-10-CM | POA: Insufficient documentation

## 2011-07-02 NOTE — Progress Notes (Signed)
Nahdia has 2 new issues. One is on the CT scan in the left lower quadrant of the pelvis where there appeared to be a lymphocele last year which may now be somewhat more complicated and thickwalled. She has no left lower quadrant pain no recent fever no chills no night sweats and actually feels quite well. In discussing her CT scan with Dr. Tyron Russell who also discussed her case with Dr. Reche Dixon there is definite concern for either a wall abscess with her history of diverticulitis last year or possible tumor recurrence. Therefore we will set up a CT guided needle biopsy for culture and pathology exam.  The second issue is her left leg has become swollen in the last 7-10 days. It is not tender but 2+ edema exists in the pretibial space and her left thigh also appears mildly enlarged. She states the swelling goes down at nighttime. There is no warmth on exam no cords. I therefore will order Dopplers of her venous system today. She is to see Dr. Rockney Ghee at the end of April and I hope to have this all resolved within the next couple of weeks at the latest.  I will tentatively see her in 4 months.

## 2011-07-02 NOTE — Patient Instructions (Signed)
[name]  [id number] 1930/08/16   [facility]  Discharge Instructions  RECOMMENDATIONS MADE BY THE CONSULTANT AND ANY TEST RESULTS WILL BE SENT TO YOUR REFERRING DOCTOR.   EXAM FINDINGS BY MD TODAY AND SIGNS AND SYMPTOMS TO REPORT TO CLINIC OR PRIMARY MD: Findings as discussed by Dr. Mariel Sleet.  You will have an ultrasound study today to determine what may be causing the swelling to your lower extremity.  We will also be contacting you once we have your next biopsy scheduled.  Please feel free to call us for any questions or concerns in the meantime.  I acknowledge that I have been informed and understand all the instructions given to me and received a copy. I do not have any more questions at this time, but understand that I may call the [facility] at [facility] at [phone] during business hours should I have any further questions or need assistance in obtaining follow-up care.    __________________________________________  _____________  __________ Signature of Patient or Authorized Representative            Date                   Time    __________________________________________ Nurse's Signature

## 2011-07-08 ENCOUNTER — Encounter (HOSPITAL_COMMUNITY): Payer: Self-pay | Admitting: Pharmacy Technician

## 2011-07-09 ENCOUNTER — Other Ambulatory Visit: Payer: Self-pay | Admitting: Radiology

## 2011-07-10 ENCOUNTER — Encounter (HOSPITAL_COMMUNITY): Payer: Self-pay

## 2011-07-10 ENCOUNTER — Ambulatory Visit (HOSPITAL_COMMUNITY)
Admission: RE | Admit: 2011-07-10 | Discharge: 2011-07-10 | Disposition: A | Discharge status: Home / Self Care | Payer: Medicare Other | Source: Ambulatory Visit | Attending: Oncology | Admitting: Oncology

## 2011-07-10 DIAGNOSIS — Z79899 Other long term (current) drug therapy: Secondary | ICD-10-CM | POA: Insufficient documentation

## 2011-07-10 DIAGNOSIS — C541 Malignant neoplasm of endometrium: Secondary | ICD-10-CM

## 2011-07-10 DIAGNOSIS — I10 Essential (primary) hypertension: Secondary | ICD-10-CM | POA: Insufficient documentation

## 2011-07-10 DIAGNOSIS — M1990 Unspecified osteoarthritis, unspecified site: Secondary | ICD-10-CM | POA: Insufficient documentation

## 2011-07-10 DIAGNOSIS — R1904 Left lower quadrant abdominal swelling, mass and lump: Secondary | ICD-10-CM | POA: Insufficient documentation

## 2011-07-10 DIAGNOSIS — Z8542 Personal history of malignant neoplasm of other parts of uterus: Secondary | ICD-10-CM | POA: Insufficient documentation

## 2011-07-10 DIAGNOSIS — E119 Type 2 diabetes mellitus without complications: Secondary | ICD-10-CM | POA: Insufficient documentation

## 2011-07-10 DIAGNOSIS — Z951 Presence of aortocoronary bypass graft: Secondary | ICD-10-CM | POA: Insufficient documentation

## 2011-07-10 LAB — CBC
Platelets: 148 10*3/uL — ABNORMAL LOW (ref 150–400)
RDW: 12.3 % (ref 11.5–15.5)
WBC: 5.1 10*3/uL (ref 4.0–10.5)

## 2011-07-10 LAB — APTT: aPTT: 31 seconds (ref 24–37)

## 2011-07-10 LAB — PROTIME-INR: INR: 0.97 (ref 0.00–1.49)

## 2011-07-10 LAB — GLUCOSE, CAPILLARY: Glucose-Capillary: 68 mg/dL — ABNORMAL LOW (ref 70–99)

## 2011-07-10 MED ORDER — MIDAZOLAM HCL 5 MG/5ML IJ SOLN
INTRAMUSCULAR | Status: AC | PRN
  Administered 2011-07-10: 2 mg via INTRAVENOUS

## 2011-07-10 MED ORDER — FENTANYL CITRATE 0.05 MG/ML IJ SOLN
INTRAMUSCULAR | Status: AC | PRN
  Administered 2011-07-10: 100 ug via INTRAVENOUS

## 2011-07-10 MED ORDER — HYDROCODONE-ACETAMINOPHEN 5-325 MG PO TABS
1.0000 | ORAL_TABLET | ORAL | Status: DC | PRN
  Filled 2011-07-10: qty 2

## 2011-07-10 MED ORDER — SODIUM CHLORIDE 0.9 % IV SOLN: Freq: Once | INTRAVENOUS | Status: DC

## 2011-07-10 MED ORDER — MIDAZOLAM HCL 2 MG/2ML IJ SOLN
INTRAMUSCULAR | Status: AC
  Filled 2011-07-10: qty 6

## 2011-07-10 MED ORDER — DEXTROSE-NACL 5-0.45 % IV SOLN
Freq: Once | INTRAVENOUS | Status: AC
  Administered 2011-07-10: 10:00:00 via INTRAVENOUS

## 2011-07-10 MED ORDER — FENTANYL CITRATE 0.05 MG/ML IJ SOLN
INTRAMUSCULAR | Status: AC
  Filled 2011-07-10: qty 6

## 2011-07-10 NOTE — H&P (Signed)
[name] is an 78 y.o. female.   Chief Complaint: Hx endometrial/Uterine Cancer; abdominal mass HPI: scheduled for LLQ abdominal mass biopsy today  Past Medical History  Diagnosis Date  . Hx of CABG     2010, Dr.Van Trigt, Maze procedure, ligation of left atrial appendage, Coumadin stopped January, 2011  . HTN (hypertension)   . Endometrial ca 09/25/2010    June, 2012, radiation and chemotherapy, Dr. Mariel Sleet  . Coronary artery disease   . Hyperlipidemia   . Noninsulin dependent diabetes mellitus   . Osteoarthritis     Severe  . Fluid overload   . Renal insufficiency     Mild with higher dose diuretic  . Ejection fraction     EF 60%, echo, September, 2010  . Atrial fibrillation     Maze procedure September, 2010, amiodarone and Coumadin stopped eventually January, 2011  . Shortness of breath     Past Surgical History  Procedure Date  . Appendectomy 60 yrs ago  . Tonsilectomy, adenoidectomy, bilateral myringotomy and tubes   . Breast mass excision     left breast 1985  . Tumor removal     from throat  benign  . Choleycystectomy 1998  . Cardiac surgery 2010    triple bypass  . Abdominal hysterectomy     complete  . Coronary artery bypass graft 12/21/08    Donata Clay; LIMA LAD, SVG posterior descending, SVG OM, maze procedure, ligation left atrial appendage  . Cardiac catheterization   . Cataract extraction   . Portacath placement     Family History  Problem Relation Age of Onset  . Hypertension Mother   . Diabetes Other   . Hypertension Other    Social History:  reports that she has never smoked. She has never used smokeless tobacco. She reports that she does not drink alcohol or use illicit drugs.  Allergies:  Allergies  Allergen Reactions  . Statins     REACTION: fatique, no energy    Medications Prior to Admission  Medication Sig Dispense Refill  . metFORMIN (GLUCOPHAGE) 500 MG tablet Take 500 mg by mouth 2 (two) times daily with a meal.       .  amLODipine (NORVASC) 5 MG tablet Take 5 mg by mouth daily after breakfast.       . aspirin 81 MG tablet Take 81 mg by mouth daily after breakfast.       . Calcium Carbonate (CALCIUM 500 PO) Take 500 mg by mouth 2 (two) times daily.      . Cholecalciferol (VITAMIN D PO) Take 400 Units by mouth daily.        . Diphenhydramine-APAP, sleep, (TYLENOL PM EXTRA STRENGTH PO) Take 1 tablet by mouth at bedtime as needed. Sleep       . Flaxseed, Linseed, (FLAXSEED OIL) 1200 MG CAPS Take 1,200 mg by mouth daily.        . furosemide (LASIX) 40 MG tablet Take 40 mg by mouth daily with breakfast.       . glipiZIDE (GLUCOTROL) 5 MG tablet Take 5 mg by mouth 2 (two) times daily before a meal.       . Glucosamine 500 MG CAPS Take 500 mg by mouth 2 (two) times daily.        . metoprolol tartrate (LOPRESSOR) 25 MG tablet Take 25 mg by mouth 2 (two) times daily.       . Omega-3 Fatty Acids (FISH OIL) 1200 MG CAPS Take 1,200 mg by  mouth 2 (two) times daily.      . potassium chloride (KLOR-CON) 20 MEQ packet Take 20 mEq by mouth daily.        . Red Yeast Rice 600 MG TABS Take 1,200 mg by mouth daily.       Marland Kitchen DISCONTD: potassium chloride SA (K-DUR,KLOR-CON) 20 MEQ tablet Take 20 mEq by mouth daily.        Medications Prior to Admission  Medication Dose Route Frequency Provider Last Rate Last Dose  . 0.9 %  sodium chloride infusion   Intravenous Once Robet Leu, PA      . dextrose 5 %-0.45 % sodium chloride infusion   Intravenous Once Robet Leu, PA 100 mL/hr at 07/10/11 1006      No results found for this or any previous visit (from the past 48 hour(s)). No results found.  Review of Systems  Cardiovascular: Negative for chest pain.  Gastrointestinal: Positive for abdominal pain. Negative for nausea and vomiting.  Neurological: Negative for headaches.    Blood pressure 171/65, pulse 60, temperature 96.8 F (36 C), resp. rate 20, SpO2 100.00%. Physical Exam  Constitutional: She is oriented to  person, place, and time. She appears well-developed.  Cardiovascular: Normal rate, regular rhythm and normal heart sounds.   No murmur heard. Respiratory: Effort normal and breath sounds normal. She has no wheezes.  GI: Soft. Bowel sounds are normal. There is tenderness.  Musculoskeletal: Normal range of motion.  Neurological: She is alert and oriented to person, place, and time.  Skin: Skin is warm.     Assessment/Plan LLQ abd mass noted on CT Hx endometrial cancer Scheduled today for LLQ mass biopsy Pt aware of procedure benefits and risks and agreeable to proceed. Consent signed.  Demarques Pilz A 07/10/2011, 10:09 AM

## 2011-07-10 NOTE — Progress Notes (Signed)
Pt took Metformin this Am arrived today with CBG of 68 and called this report  To Beckey Downing Pa orders received for D5 .45NS and to reevaluate CBG in 30 minutes

## 2011-07-10 NOTE — Procedures (Signed)
Procedure:  CT guided biopsy of left iliac pelvic mass Findings:  17 G needle advanced from transgluteal approach to left iliac lesion.  22G FNA x 5 w/ material for cytology and culture studies.  18G core samples x 5. No immediate complications.

## 2011-07-10 NOTE — H&P (Signed)
Agree 

## 2011-07-10 NOTE — Progress Notes (Signed)
Pt assisted to BR with episode of diarrhea. Pt states she feels fine and is eager to go home

## 2011-07-10 NOTE — ED Notes (Signed)
Patient is resting comfortably. 

## 2011-07-10 NOTE — Discharge Instructions (Signed)
Biopsy A biopsy is a procedure in which small samples of tissue are removed from the body. The tissue is examined under a microscope. A biopsy may be done to determine the cause (diagnosis) of a condition or mass (tumor). A biopsy may also be done to determine the best treatment for you. In some instances, a biopsy may be performed on normal tissue to determine if cancer has spread or if a transplanted organ is being rejected. There are 2 ways to obtain samples:  Fine needle biopsy. Samples are removed using a thin needle inserted through the skin.   Open biopsy. Samples are removed after a cut (incision) is made through the skin.  LET YOUR CAREGIVER KNOW ABOUT:  Allergies to food or medicine.   Medicines taken, including vitamins, herbs, eyedrops, over-the-counter medicines, and creams.   Use of steroids (by mouth or creams).   Previous problems with anesthetics or numbing medicines.   History of bleeding problems or blood clots.   Previous surgery.   Other health problems, including diabetes and kidney problems.   Possibility of pregnancy, if this applies.  RISKS AND COMPLICATIONS  Bleeding from the biopsy site. The risk of bleeding is higher if you have a bleeding disorder or are taking any blood thinning medicines (anticoagulants).   Infection.   Injury to organs or structures near the biopsy site.   Chronic pain at the biopsy site. This is defined as pain that lasts for more than 3 months.   Very rarely, a second biopsy may be required if not enough tissue was collected during the first biopsy.  BEFORE THE PROCEDURE Ask your caregiver what time you need to arrive for your procedure. Ask your caregiver whether you need to stop eating or drinking (fast) before your procedure. Ask your caregiver about changing or stopping your regular medicines. A blood sample may be done to determine your blood clotting time. Medicine may be given to help you relax (sedative). PROCEDURE During  a fine needle biopsy, you will be awake during the procedure. You will be positioned to allow the best possible access to the biopsy site. Let your caregiver know if the position is not comfortable. The biopsy site will be cleaned. A needle is inserted through your skin. You may feel mild discomfort during this procedure. The needle is withdrawn once tissue samples have been removed. Pressure may be applied to the biopsy site to reduce swelling and to ensure that bleeding has stopped. The samples will be sent to be examined. During an open biopsy, you may be given medicine that numbs the area (local anesthetic) or medicine that makes you sleep (general anesthetic). An incision is made through the skin. A tissue sample or the entire mass is removed. The sample or mass will be sent to be examined. Sometimes, the sample or mass may be examined during the procedure. If the sample or mass contains cancer cells, further tissue or structures may be removed. The incision is then closed with stitches (sutures) or skin glue (adhesive). AFTER THE PROCEDURE Your recovery will be assessed and monitored. If there are no problems, you should be able to go home shortly after the procedure (outpatient). You will need to arrange for someone to drive you home if you received a sedative or pain relieving medicine during the procedure. Ask when your test results will be ready. Make sure you get your test results. Document Released: 03/21/2000 Document Revised: 03/13/2011 Document Reviewed: 09/19/2010 [employer] Patient Information 2012 [employer]. 

## 2011-07-10 NOTE — Progress Notes (Signed)
Pt had Power Port left chest but did not use EMLA cream today so Peripheral IV was started and labs drawn

## 2011-07-13 LAB — CULTURE, ROUTINE-ABSCESS: Gram Stain: NONE SEEN

## 2011-07-15 LAB — ANAEROBIC CULTURE: Gram Stain: NONE SEEN

## 2011-07-18 ENCOUNTER — Other Ambulatory Visit (HOSPITAL_COMMUNITY): Payer: Self-pay

## 2011-07-18 ENCOUNTER — Telehealth (HOSPITAL_COMMUNITY): Payer: Self-pay

## 2011-07-18 ENCOUNTER — Other Ambulatory Visit (HOSPITAL_COMMUNITY): Payer: Self-pay | Admitting: Oncology

## 2011-07-18 DIAGNOSIS — C541 Malignant neoplasm of endometrium: Secondary | ICD-10-CM

## 2011-07-18 DIAGNOSIS — L0291 Cutaneous abscess, unspecified: Secondary | ICD-10-CM

## 2011-07-18 MED ORDER — CIPROFLOXACIN HCL 500 MG PO TABS: ORAL_TABLET | ORAL | Status: DC

## 2011-07-18 NOTE — Telephone Encounter (Signed)
Patient notified

## 2011-07-18 NOTE — Telephone Encounter (Signed)
Message copied by Sterling Big on Fri Jul 18, 2011  5:54 PM ------      Message from: Mariel Sleet, ERIC S      Created: Fri Jul 18, 2011  5:16 PM       Make sure you warn Clemie about possible diarrhea and to stop if it happens.

## 2011-07-18 NOTE — Telephone Encounter (Signed)
Spoke with [name] and she already has the antibiotic.  Instructed that CT of pelvis will be done on 5/13 and that she will need to pick up her oral contrast prior to scan.  Verbalized understanding.

## 2011-07-24 ENCOUNTER — Encounter: Payer: Self-pay | Admitting: Gynecologic Oncology

## 2011-07-24 ENCOUNTER — Ambulatory Visit: Payer: Medicare Other | Attending: Gynecologic Oncology | Admitting: Gynecologic Oncology

## 2011-07-24 VITALS — BP 138/60 | HR 62 | Temp 97.6°F | Resp 18 | Ht 63.0 in | Wt 166.1 lb

## 2011-07-24 DIAGNOSIS — Z9221 Personal history of antineoplastic chemotherapy: Secondary | ICD-10-CM | POA: Insufficient documentation

## 2011-07-24 DIAGNOSIS — Z7982 Long term (current) use of aspirin: Secondary | ICD-10-CM | POA: Insufficient documentation

## 2011-07-24 DIAGNOSIS — C549 Malignant neoplasm of corpus uteri, unspecified: Secondary | ICD-10-CM | POA: Insufficient documentation

## 2011-07-24 DIAGNOSIS — Z90710 Acquired absence of both cervix and uterus: Secondary | ICD-10-CM | POA: Insufficient documentation

## 2011-07-24 DIAGNOSIS — C569 Malignant neoplasm of unspecified ovary: Secondary | ICD-10-CM

## 2011-07-24 DIAGNOSIS — Z951 Presence of aortocoronary bypass graft: Secondary | ICD-10-CM | POA: Insufficient documentation

## 2011-07-24 DIAGNOSIS — G5690 Unspecified mononeuropathy of unspecified upper limb: Secondary | ICD-10-CM | POA: Insufficient documentation

## 2011-07-24 DIAGNOSIS — Z9079 Acquired absence of other genital organ(s): Secondary | ICD-10-CM | POA: Insufficient documentation

## 2011-07-24 DIAGNOSIS — I10 Essential (primary) hypertension: Secondary | ICD-10-CM | POA: Insufficient documentation

## 2011-07-24 DIAGNOSIS — Z79899 Other long term (current) drug therapy: Secondary | ICD-10-CM | POA: Insufficient documentation

## 2011-07-24 DIAGNOSIS — C775 Secondary and unspecified malignant neoplasm of intrapelvic lymph nodes: Secondary | ICD-10-CM | POA: Insufficient documentation

## 2011-07-24 DIAGNOSIS — N731 Chronic parametritis and pelvic cellulitis: Secondary | ICD-10-CM | POA: Insufficient documentation

## 2011-07-24 DIAGNOSIS — E119 Type 2 diabetes mellitus without complications: Secondary | ICD-10-CM | POA: Insufficient documentation

## 2011-07-24 DIAGNOSIS — Z923 Personal history of irradiation: Secondary | ICD-10-CM | POA: Insufficient documentation

## 2011-07-24 DIAGNOSIS — E785 Hyperlipidemia, unspecified: Secondary | ICD-10-CM | POA: Insufficient documentation

## 2011-07-24 DIAGNOSIS — I2510 Atherosclerotic heart disease of native coronary artery without angina pectoris: Secondary | ICD-10-CM | POA: Insufficient documentation

## 2011-07-24 NOTE — Patient Instructions (Signed)
RTC in 3 months

## 2011-07-24 NOTE — Progress Notes (Signed)
Consult Note: Gyn-Onc  [name] 78 y.o. female  CC:  Chief Complaint  Patient presents with  . Cancer    Follow up    RUE:[id number]: [name] is a very pleasant 78 year old who was initially referred to [place] by Dr. Darnelle Bos. At that time, she had an episode of bleeding per rectum, colonoscopy was not feasible secondary to acute  diverticulitis, for which she was treated with antibiotics. A CT scan of the abdomen and pelvis in March 2012 revealed a thickening in the fundus of the uterus and she underwent an endometrial biopsy that revealed a grade 2 endometrioid adenocarcinoma. On April 24th, she underwent a total robotic hysterectomy, BSO, bilateral pelvic and para- aortic lymph node dissection. Pathology returned as a grade 2 endometrioid adenocarcinoma with 0.9 out of 1.2 cm myometrial invasion. She had evidence of lymphovascular space involvement. 0 out of 9 right  periaortic, 0 out of 2 left periaortic, 0 out of 7 right pelvic nodes were negative, however, 1 out of 5 left pelvic lymph nodes were positive for a total of 1 out of 23 lymph nodes. She was counseled regarding GOG protocol, but declined and she recently completed paclitaxel and carboplatin and radiation in a sandwich fashion (04/10/11).  She had a posttreatment CT scan that revealed no evidence of metastatic disease however within the left pelvic region was felt to be a seroma versus lymphocyst had a irregular appearance. She underwent a biopsy and aspiration of this area on April 4. It came back as Klebsiella and she's currently on ciprofloxacin 500 mg twice daily for 30 days cycle. Histology revealed fibrous tissue and necrosis acute and chronic inflammation and abscess formation. There is no evidence of malignancy. She comes in today for followup.     REVIEW OF SYSTEMS: She states that she really had some low-fatigue issues during her chemotherapy. She received what sound like Neulasta with first cycle and had significant bone  pain and did not receive that  again. She felt quite lifeless after her first cycle. She hurt significantly for 4-5 days and then it got better. She has really had no nausea throughout. She states that since she stopped getting the Neulasta, they have really not had any problems with her counts and she has been able to go through chemotherapy without delay. She feels very  positive about her treatment. She is very happy that she never experienced any nausea.  Neuropathy in finger tips that does not affect quality of life significantly. She had diarrhea during her radiation that was poorly controlled with Imodium and Lomotil. That is subsequently resolved she now just has one maybe 2 bowel movements per day. 10 point review of systems is otherwise negative  Interval History:   Review of Systems  Current Meds:  Outpatient Encounter Prescriptions as of 07/24/2011  Medication Sig Dispense Refill  . amLODipine (NORVASC) 5 MG tablet Take 5 mg by mouth daily after breakfast.       . aspirin 81 MG tablet Take 81 mg by mouth daily after breakfast.       . Calcium Carbonate (CALCIUM 500 PO) Take 500 mg by mouth 2 (two) times daily.      . Cholecalciferol (VITAMIN D PO) Take 400 Units by mouth daily.        . ciprofloxacin (CIPRO) 500 MG tablet To take 500mg  bid for 30 days.  60 tablet  0  . Diphenhydramine-APAP, sleep, (TYLENOL PM EXTRA STRENGTH PO) Take 1 tablet by mouth at bedtime  as needed. Sleep       . Flaxseed, Linseed, (FLAXSEED OIL) 1200 MG CAPS Take 1,200 mg by mouth daily.        . furosemide (LASIX) 40 MG tablet Take 40 mg by mouth daily with breakfast.       . glipiZIDE (GLUCOTROL) 5 MG tablet Take 5 mg by mouth 2 (two) times daily before a meal.       . Glucosamine 500 MG CAPS Take 500 mg by mouth 2 (two) times daily.        . metFORMIN (GLUCOPHAGE) 500 MG tablet Take 500 mg by mouth 2 (two) times daily with a meal.       . metoprolol tartrate (LOPRESSOR) 25 MG tablet Take 25 mg by mouth 2  (two) times daily.       . Omega-3 Fatty Acids (FISH OIL) 1200 MG CAPS Take 1,200 mg by mouth 2 (two) times daily.      . potassium chloride (KLOR-CON) 20 MEQ packet Take 20 mEq by mouth daily.        . Red Yeast Rice 600 MG TABS Take 1,200 mg by mouth daily.         Allergy:  Allergies  Allergen Reactions  . Statins     REACTION: fatique, no energy    Social Hx:   History   Social History  . Marital Status: Widowed    Spouse Name: N/A    Number of Children: N/A  . Years of Education: N/A   Occupational History  . Retired    Social History Main Topics  . Smoking status: Never Smoker   . Smokeless tobacco: Never Used  . Alcohol Use: No  . Drug Use: No  . Sexually Active: Not on file   Other Topics Concern  . Not on file   Social History Narrative  . No narrative on file    Past Surgical Hx:  Past Surgical History  Procedure Date  . Appendectomy 60 yrs ago  . Tonsilectomy, adenoidectomy, bilateral myringotomy and tubes   . Breast mass excision     left breast 1985  . Tumor removal     from throat  benign  . Choleycystectomy 1998  . Cardiac surgery 2010    triple bypass  . Abdominal hysterectomy     complete  . Coronary artery bypass graft 12/21/08    Donata Clay; LIMA LAD, SVG posterior descending, SVG OM, maze procedure, ligation left atrial appendage  . Cardiac catheterization   . Cataract extraction   . Portacath placement     Past Medical Hx:  Past Medical History  Diagnosis Date  . Hx of CABG     2010, Dr.Van Trigt, Maze procedure, ligation of left atrial appendage, Coumadin stopped January, 2011  . HTN (hypertension)   . Endometrial ca 09/25/2010    June, 2012, radiation and chemotherapy, Dr. Mariel Sleet  . Coronary artery disease   . Hyperlipidemia   . Noninsulin dependent diabetes mellitus   . Osteoarthritis     Severe  . Fluid overload   . Renal insufficiency     Mild with higher dose diuretic  . Ejection fraction     EF 60%, echo,  September, 2010  . Atrial fibrillation     Maze procedure September, 2010, amiodarone and Coumadin stopped eventually January, 2011  . Shortness of breath     Family Hx:  Family History  Problem Relation Age of Onset  . Hypertension Mother   . Diabetes Other   .  Hypertension Other     Vitals:  Blood pressure 138/60, pulse 62, temperature 97.6 F (36.4 C), temperature source Oral, resp. rate 18, height 5\' 3"  (1.6 m), weight 166 lb 1.6 oz (75.342 kg).  Physical Exam: Well-nourished well-developed female in no acute distress.  Neck: No lymphadenopathy no thyromegaly.  Lungs: Clear to auscultation bilaterally.  Cardiovascular: Regular rate rhythm.  Abdomen: Soft nontender nondistended there are no palpable masses or hepatosplenomegaly. She has minimal skin changes from radiation.  Groins: No lymphadenopathy.  Extremities: She is a small a patch of erythema where she had a "bug bites" on. She is taking some Cipro which should cover this and is using a topical steroid with improvement.  Pelvic: Normal external female genitalia. The vagina is markedly atrophic. She is a grade 1 cystocele. There is no visible lesions. Bimanual examination reveals no masses or nodularity. Rectal confirms.  Assessment/Plan:  78 year old with stage IIIc 1 endometrial carcinoma who is status post chemotherapy and radiation with equivocal exam which protocol. She completed 3 cycles of chemotherapy with paclitaxel carboplatin followed by pelvic radiation in an additional 3 cycles of chemotherapy which she completed January 3. She has no evidence of recurrent disease but appears to have had superinfection of Alimta cyst versus a seroma from her surgery. We appreciate the care of coordinating the biopsy and she is on appropriate antibiotics. She has a followup CT scan scheduled for May 13 and an appointment with Dr. Mariel Sleet on May 22. She'll return to see me in 3 months Dollene Mallery A., MD 07/24/2011, 10:28  AM

## 2011-07-30 ENCOUNTER — Ambulatory Visit: Payer: Medicare Other | Admitting: Gynecologic Oncology

## 2011-08-15 ENCOUNTER — Encounter (HOSPITAL_COMMUNITY): Payer: Medicare Other | Attending: Oncology

## 2011-08-15 DIAGNOSIS — C541 Malignant neoplasm of endometrium: Secondary | ICD-10-CM

## 2011-08-15 DIAGNOSIS — I998 Other disorder of circulatory system: Secondary | ICD-10-CM | POA: Insufficient documentation

## 2011-08-15 DIAGNOSIS — C549 Malignant neoplasm of corpus uteri, unspecified: Secondary | ICD-10-CM

## 2011-08-15 LAB — COMPREHENSIVE METABOLIC PANEL
ALT: 13 U/L (ref 0–35)
Calcium: 9.8 mg/dL (ref 8.4–10.5)
GFR calc Af Amer: 52 mL/min — ABNORMAL LOW (ref >90)
Glucose, Bld: 125 mg/dL — ABNORMAL HIGH (ref 70–99)
Sodium: 136 mEq/L (ref 135–145)
Total Protein: 7 g/dL (ref 6.0–8.3)

## 2011-08-15 LAB — CBC
Hemoglobin: 11.3 g/dL — ABNORMAL LOW (ref 12.0–15.0)
MCH: 30.1 pg (ref 26.0–34.0)
MCHC: 34.2 g/dL (ref 30.0–36.0)
RDW: 12.5 % (ref 11.5–15.5)

## 2011-08-15 NOTE — Progress Notes (Signed)
Herminio Heads presented for [employer]. Labs per MD order drawn via Peripheral Line 23 gauge needle inserted in right AC  Good blood return present. Procedure without incident.  Needle removed intact. Patient tolerated procedure well.

## 2011-08-18 ENCOUNTER — Ambulatory Visit (HOSPITAL_COMMUNITY)
Admission: RE | Admit: 2011-08-18 | Discharge: 2011-08-18 | Disposition: A | Discharge status: Home / Self Care | Payer: Medicare Other | Source: Ambulatory Visit | Attending: Oncology | Admitting: Oncology

## 2011-08-18 DIAGNOSIS — N731 Chronic parametritis and pelvic cellulitis: Secondary | ICD-10-CM | POA: Insufficient documentation

## 2011-08-18 DIAGNOSIS — Z8542 Personal history of malignant neoplasm of other parts of uterus: Secondary | ICD-10-CM | POA: Insufficient documentation

## 2011-08-18 DIAGNOSIS — N2889 Other specified disorders of kidney and ureter: Secondary | ICD-10-CM | POA: Insufficient documentation

## 2011-08-18 DIAGNOSIS — L0291 Cutaneous abscess, unspecified: Secondary | ICD-10-CM

## 2011-08-18 DIAGNOSIS — K5730 Diverticulosis of large intestine without perforation or abscess without bleeding: Secondary | ICD-10-CM | POA: Insufficient documentation

## 2011-08-18 MED ORDER — IOHEXOL 300 MG/ML  SOLN
100.0000 mL | Freq: Once | INTRAMUSCULAR | Status: AC | PRN
  Administered 2011-08-18: 100 mL via INTRAVENOUS

## 2011-08-27 ENCOUNTER — Encounter (HOSPITAL_BASED_OUTPATIENT_CLINIC_OR_DEPARTMENT_OTHER): Payer: Medicare Other | Admitting: Oncology

## 2011-08-27 VITALS — BP 140/72 | HR 60 | Temp 96.9°F | Wt 168.0 lb

## 2011-08-27 DIAGNOSIS — C549 Malignant neoplasm of corpus uteri, unspecified: Secondary | ICD-10-CM

## 2011-08-27 DIAGNOSIS — G609 Hereditary and idiopathic neuropathy, unspecified: Secondary | ICD-10-CM

## 2011-08-27 DIAGNOSIS — C541 Malignant neoplasm of endometrium: Secondary | ICD-10-CM

## 2011-08-27 DIAGNOSIS — I878 Other specified disorders of veins: Secondary | ICD-10-CM

## 2011-08-27 MED ORDER — HEPARIN SOD (PORK) LOCK FLUSH 100 UNIT/ML IV SOLN
INTRAVENOUS | Status: AC
  Filled 2011-08-27: qty 5

## 2011-08-27 MED ORDER — SODIUM CHLORIDE 0.9 % IJ SOLN
INTRAMUSCULAR | Status: AC
  Filled 2011-08-27: qty 10

## 2011-08-27 MED ORDER — HEPARIN SOD (PORK) LOCK FLUSH 100 UNIT/ML IV SOLN
500.0000 [IU] | Freq: Once | INTRAVENOUS | Status: AC
  Administered 2011-08-27: 500 [IU] via INTRAVENOUS
  Filled 2011-08-27: qty 5

## 2011-08-27 MED ORDER — SODIUM CHLORIDE 0.9 % IJ SOLN
10.0000 mL | INTRAMUSCULAR | Status: DC | PRN
  Administered 2011-08-27: 10 mL via INTRAVENOUS
  Filled 2011-08-27: qty 10

## 2011-08-27 NOTE — Progress Notes (Signed)
This office note has been dictated.

## 2011-08-27 NOTE — Progress Notes (Signed)
CC:   [name], MD [name]. Duard Brady, MD  DIAGNOSES: 1. Uterine cancer, status post surgery followed by chemotherapy and     radiation therapy.  She had initially surgery consisting of total     abdominal hysterectomy with bilateral salpingo-oophorectomy,     bilateral pelvic and periaortic lymph node dissection showing a     grade 2 endometrioid adenocarcinoma with 0.9 out of 1.2 cm     myometrial invasion.  She had evidence of LVI, but 1/5 left pelvic     lymph nodes were positive for a total of 1/23 lymph nodes.  She was     offered a GOG protocol, but declined and was treated with     carboplatin and Taxol in a sandwich fashion finishing all therapy     earlier this year.  Her date of surgery was 07/30/2010. 2. Abscess of the pelvis which is now resolved by CT scan after biopsy     and antibiotic therapy. She has minimal peripheral neuropathy complaints, and she would like her Port-A-Cath out, but tonight I do not think we should take that out just yet.  I have asked her to discuss this with Dr. Duard Brady in July, but I suspect we would both want it to stay in at least another year to make sure this does not come back fairly quickly.  I also suspect Dr. Duard Brady will want a CT scan again in either January or February, but will wait to hear from her after she sees Kiarrah in July.  Kierrah just otherwise feels very good.  So we will see her back.  It is of note that we did do her CT scan the other day which shows complete resolution of this small abscess in the pelvis.  No tumor recurrence was found, and only degenerative joint disk disease of the back were seen.  Her labs on the 10th of May really were quite good.  Creatinine was 1.14 barely elevated, BUN 30, so she probably needs to be drinking a little bit more fluids.  Her white count was still mildly low at 3600, that has been intermittently low.  Hemoglobin was excellent at 11.3 g, platelets 144,000.  So we will see her back.   I will see her in October since Dr. Duard Brady is going to see her in July.  She is to call if we need to see her sooner.    ______________________________ Ladona Horns. Mariel Sleet, MD ESN/MEDQ  D:  08/27/2011  T:  08/27/2011  Job:  [id number]

## 2011-08-27 NOTE — Patient Instructions (Signed)
[name]  [id number] [birth year] Dr. Glenford Peers   [facility]  Discharge Instructions  RECOMMENDATIONS MADE BY THE CONSULTANT AND ANY TEST RESULTS WILL BE SENT TO YOUR REFERRING DOCTOR.   EXAM FINDINGS BY MD TODAY AND SIGNS AND SYMPTOMS TO REPORT TO CLINIC OR PRIMARY MD: Your scans were great.  Talk with Dr. Grant Ruts about getting your port out.    MEDICATIONS PRESCRIBED: none   INSTRUCTIONS GIVEN AND DISCUSSED: Other :  Report any abdominal distention/discomfort, shortness of breath, etc.  SPECIAL INSTRUCTIONS/FOLLOW-UP: Lab work Needed in October and Return to Clinic for port flushes every 6 weeks and to see Dr. Mariel Sleet in October.   I acknowledge that I have been informed and understand all the instructions given to me and received a copy. I do not have any more questions at this time, but understand that I may call the Specialty Clinic at [facility] at [phone] during business hours should I have any further questions or need assistance in obtaining follow-up care.    __________________________________________  _____________  __________ Signature of Patient or Authorized Representative            Date                   Time    __________________________________________ Nurse's Signature

## 2011-08-27 NOTE — Progress Notes (Signed)
[name] presented for Portacath access and flush. Proper placement of portacath confirmed by CXR. Portacath located right chest wall accessed with  H 20 needle. Good blood return present. Portacath flushed with 20ml NS and 500U/84ml Heparin and needle removed intact. Procedure without incident. Patient tolerated procedure well.

## 2011-10-08 ENCOUNTER — Encounter (HOSPITAL_COMMUNITY): Payer: Medicare Other | Attending: Oncology

## 2011-10-08 DIAGNOSIS — Z452 Encounter for adjustment and management of vascular access device: Secondary | ICD-10-CM

## 2011-10-08 DIAGNOSIS — C541 Malignant neoplasm of endometrium: Secondary | ICD-10-CM

## 2011-10-08 DIAGNOSIS — C549 Malignant neoplasm of corpus uteri, unspecified: Secondary | ICD-10-CM

## 2011-10-08 MED ORDER — HEPARIN SOD (PORK) LOCK FLUSH 100 UNIT/ML IV SOLN
INTRAVENOUS | Status: AC
  Filled 2011-10-08: qty 5

## 2011-10-08 MED ORDER — HEPARIN SOD (PORK) LOCK FLUSH 100 UNIT/ML IV SOLN
500.0000 [IU] | Freq: Once | INTRAVENOUS | Status: AC
  Administered 2011-10-08: 500 [IU] via INTRAVENOUS
  Filled 2011-10-08: qty 5

## 2011-10-08 NOTE — Progress Notes (Signed)
[name] presented for Portacath access and flush. Proper placement of portacath confirmed by CXR. Portacath located lt chest wall accessed with  H 20 needle. Good blood return present. Portacath flushed with 20ml NS and 500U/5ml Heparin and needle removed intact. Procedure without incident. Patient tolerated procedure well.   

## 2011-10-22 ENCOUNTER — Ambulatory Visit: Payer: Medicare Other | Attending: Gynecologic Oncology | Admitting: Gynecologic Oncology

## 2011-10-22 ENCOUNTER — Encounter: Payer: Self-pay | Admitting: Gynecologic Oncology

## 2011-10-22 ENCOUNTER — Other Ambulatory Visit (HOSPITAL_COMMUNITY)
Admission: RE | Admit: 2011-10-22 | Discharge: 2011-10-22 | Disposition: A | Discharge status: Home / Self Care | Payer: Medicare Other | Source: Ambulatory Visit | Attending: Gynecologic Oncology | Admitting: Gynecologic Oncology

## 2011-10-22 VITALS — BP 112/60 | HR 68 | Temp 97.7°F | Resp 18 | Ht 62.28 in | Wt 174.3 lb

## 2011-10-22 DIAGNOSIS — Z124 Encounter for screening for malignant neoplasm of cervix: Secondary | ICD-10-CM | POA: Insufficient documentation

## 2011-10-22 DIAGNOSIS — E119 Type 2 diabetes mellitus without complications: Secondary | ICD-10-CM | POA: Insufficient documentation

## 2011-10-22 DIAGNOSIS — I4891 Unspecified atrial fibrillation: Secondary | ICD-10-CM | POA: Insufficient documentation

## 2011-10-22 DIAGNOSIS — Z833 Family history of diabetes mellitus: Secondary | ICD-10-CM | POA: Insufficient documentation

## 2011-10-22 DIAGNOSIS — C541 Malignant neoplasm of endometrium: Secondary | ICD-10-CM

## 2011-10-22 DIAGNOSIS — Z9221 Personal history of antineoplastic chemotherapy: Secondary | ICD-10-CM | POA: Insufficient documentation

## 2011-10-22 DIAGNOSIS — Z888 Allergy status to other drugs, medicaments and biological substances status: Secondary | ICD-10-CM | POA: Insufficient documentation

## 2011-10-22 DIAGNOSIS — Z8249 Family history of ischemic heart disease and other diseases of the circulatory system: Secondary | ICD-10-CM | POA: Insufficient documentation

## 2011-10-22 DIAGNOSIS — E785 Hyperlipidemia, unspecified: Secondary | ICD-10-CM | POA: Insufficient documentation

## 2011-10-22 DIAGNOSIS — Z7982 Long term (current) use of aspirin: Secondary | ICD-10-CM | POA: Insufficient documentation

## 2011-10-22 DIAGNOSIS — Z01419 Encounter for gynecological examination (general) (routine) without abnormal findings: Secondary | ICD-10-CM | POA: Insufficient documentation

## 2011-10-22 DIAGNOSIS — K5732 Diverticulitis of large intestine without perforation or abscess without bleeding: Secondary | ICD-10-CM | POA: Insufficient documentation

## 2011-10-22 DIAGNOSIS — Z951 Presence of aortocoronary bypass graft: Secondary | ICD-10-CM | POA: Insufficient documentation

## 2011-10-22 DIAGNOSIS — Z923 Personal history of irradiation: Secondary | ICD-10-CM | POA: Insufficient documentation

## 2011-10-22 DIAGNOSIS — I10 Essential (primary) hypertension: Secondary | ICD-10-CM | POA: Insufficient documentation

## 2011-10-22 DIAGNOSIS — Z9889 Other specified postprocedural states: Secondary | ICD-10-CM | POA: Insufficient documentation

## 2011-10-22 DIAGNOSIS — I2510 Atherosclerotic heart disease of native coronary artery without angina pectoris: Secondary | ICD-10-CM | POA: Insufficient documentation

## 2011-10-22 DIAGNOSIS — Z8542 Personal history of malignant neoplasm of other parts of uterus: Secondary | ICD-10-CM | POA: Insufficient documentation

## 2011-10-22 DIAGNOSIS — Z90710 Acquired absence of both cervix and uterus: Secondary | ICD-10-CM | POA: Insufficient documentation

## 2011-10-22 NOTE — Patient Instructions (Signed)
RTC 6 months

## 2011-10-22 NOTE — Progress Notes (Signed)
Consult Note: Gyn-Onc  [name] 78 y.o. female  CC:  Chief Complaint  Patient presents with  . Endometrial cancer    Follow up    HPI:  [name] is a very pleasant 78 year old who was initially referred to [place] by Dr. Darnelle Lee. At that time, she had an episode of bleeding per rectum, colonoscopy was not feasible secondary to acute  diverticulitis, for which she was treated with antibiotics. A CT scan of the abdomen and pelvis in March 2012 revealed a thickening in the fundus of the uterus and she underwent an endometrial biopsy that revealed a grade 2 endometrioid adenocarcinoma. On April 24th, she underwent a total robotic hysterectomy, BSO, bilateral pelvic and para- aortic lymph node dissection. Pathology returned as a grade 2 endometrioid adenocarcinoma with 0.9 out of 1.2 cm myometrial invasion. She had evidence of lymphovascular space involvement. 0 out of 9 right  periaortic, 0 out of 2 left periaortic, 0 out of 7 right pelvic nodes were negative, however, 1 out of 5 left pelvic lymph nodes were positive for a total of 1 out of 23 lymph nodes. She was counseled regarding GOG protocol, but declined and she recently completed paclitaxel and carboplatin and radiation in a sandwich fashion (04/10/11). She had a posttreatment CT scan that revealed no evidence of metastatic disease however within the left pelvic region was felt to be a seroma versus lymphocyst had a irregular appearance. She underwent a biopsy and aspiration of this area on April 4. It came back as Klebsiella and she's currently on ciprofloxacin 500 mg twice daily for 30 days cycle. Histology revealed fibrous tissue and necrosis acute and chronic inflammation and abscess formation. There is no evidence of malignancy. She comes in today for followup. She had a negative post treatment CT scan in 5/13.  REVIEW OF SYSTEMS: She states that she really had some low-fatigue issues during her chemotherapy is markedly better now. She does  feel that she can't quite she did before but she doesn't know her long as [employer]. She was independently increases her house. She does occasionally have some shortness of breath when she walks up her driveway as it is up help to walk down easily. She is also bothered by some bilateral arthritis in her knees. She states that her strength in her hands is somewhat diminished somewhat she recalls being. However, she denies any neuropathy otherwise. She does complain of some occasional swelling in her feet. She is on Lasix. She saw [name] in the spring I discussed having her port removed. She actually likes the concept of having her port however it sometimes bothers her seatbelt.  10 point sysems review is otherwise negative. She has a followup scheduled with [name] in November.    Current Meds:  Outpatient Encounter Prescriptions as of 10/22/2011  Medication Sig Dispense Refill  . amLODipine (NORVASC) 5 MG tablet Take 5 mg by mouth daily after breakfast.       . aspirin 81 MG tablet Take 81 mg by mouth daily after breakfast.       . Calcium Carbonate (CALCIUM 500 PO) Take 500 mg by mouth 2 (two) times daily.      . Cholecalciferol (VITAMIN D PO) Take 400 Units by mouth daily.        . Diphenhydramine-APAP, sleep, (TYLENOL PM EXTRA STRENGTH PO) Take 1 tablet by mouth at bedtime as needed. Sleep       . Flaxseed, Linseed, (FLAXSEED OIL) 1200 MG  CAPS Take 1,200 mg by mouth daily.        . furosemide (LASIX) 40 MG tablet Take 40 mg by mouth daily with breakfast.       . glipiZIDE (GLUCOTROL) 5 MG tablet Take 5 mg by mouth 2 (two) times daily before a meal.       . Glucosamine 500 MG CAPS Take 500 mg by mouth 2 (two) times daily.        . metFORMIN (GLUCOPHAGE) 500 MG tablet Take 500 mg by mouth 2 (two) times daily with a meal.       . metoprolol tartrate (LOPRESSOR) 25 MG tablet Take 25 mg by mouth 2 (two) times daily.       . Omega-3 Fatty Acids (FISH OIL) 1200 MG CAPS  Take 1,200 mg by mouth 2 (two) times daily.      . potassium chloride (KLOR-CON) 20 MEQ packet Take 20 mEq by mouth daily.        . Red Yeast Rice 600 MG TABS Take 1,200 mg by mouth daily.         Allergy:  Allergies  Allergen Reactions  . Statins     REACTION: fatique, no energy    Social Hx:   History   Social History  . Marital Status: Widowed    Spouse Name: N/A    Number of Children: N/A  . Years of Education: N/A   Occupational History  . Retired    Social History Main Topics  . Smoking status: Never Smoker   . Smokeless tobacco: Never Used  . Alcohol Use: No  . Drug Use: No  . Sexually Active: Not on file   Other Topics Concern  . Not on file   Social History Narrative  . No narrative on file    Past Surgical Hx:  Past Surgical History  Procedure Date  . Appendectomy 60 yrs ago  . Tonsilectomy, adenoidectomy, bilateral myringotomy and tubes   . Breast mass excision     left breast 1985  . Tumor removal     from throat  benign  . Choleycystectomy 1998  . Cardiac surgery 2010    triple bypass  . Abdominal hysterectomy     complete  . Coronary artery bypass graft 12/21/08    Donata Clay; LIMA LAD, SVG posterior descending, SVG OM, maze procedure, ligation left atrial appendage  . Cardiac catheterization   . Cataract extraction   . Portacath placement     Past Medical Hx:  Past Medical History  Diagnosis Date  . Hx of CABG     2010, LindsayVan Lee, Maze procedure, ligation of left atrial appendage, Coumadin stopped January, 2011  . HTN (hypertension)   . Endometrial ca 09/25/2010    June, 2012, radiation and chemotherapy, [name]  . Coronary artery disease   . Hyperlipidemia   . Noninsulin dependent diabetes mellitus   . Osteoarthritis     Severe  . Fluid overload   . Renal insufficiency     Mild with higher dose diuretic  . Ejection fraction     EF 60%, echo, September, 2010  . Atrial fibrillation     Maze procedure September, 2010,  amiodarone and Coumadin stopped eventually January, 2011  . Shortness of breath     Family Hx:  Family History  Problem Relation Age of Onset  . Hypertension Mother   . Diabetes Other   . Hypertension Other     Vitals:  Blood pressure 112/60, pulse 68,  temperature 97.7 F (36.5 C), temperature source Oral, resp. rate 18, height 5' 2.28" (1.582 m), weight 174 lb 4.8 oz (79.062 kg).  Physical Exam:  Well-nourished well-developed female in no acute distress.   Neck: No lymphadenopathy no thyromegaly.  Lungs: Clear to auscultation bilaterally.   Cardiovascular: Regular rate rhythm.   Abdomen: Soft nontender nondistended there are no palpable masses or hepatosplenomegaly. She has minimal skin changes from radiation.   Groins: No lymphadenopathy.   Extremities: 1-2+ pitting edema slightly worse on left than right.  Pelvic: Normal external female genitalia. The vagina is markedly atrophic. She is a grade 1 cystocele. There is no visible lesions. Pap smear performed.  Bimanual examination reveals no masses or nodularity. Rectal confirms.   Assessment/Plan:  78 year old with stage IIIc 1 endometrial carcinoma who is status post chemotherapy and radiation on a "sandwich" protocol. She completed 3 cycles of chemotherapy with paclitaxel carboplatin followed by pelvic radiation in an additional 3 cycles of chemotherapy which she completed April 10, 2011. She had a negative CT scan in May. Interval followup in results of her Pap smear from today. She did have her Port-A-Cath removed at this time if she would like that she may choose to keep it. She states she has followup with [name] scheduled in November. I think would be reasonable to have a CT scan around the time of that visit. She'll return to see [place] 3 months after the visit with him.   Assessment/Plan:   Jet Traynham A., MD 10/22/2011, 10:02 AM

## 2011-10-28 ENCOUNTER — Telehealth: Payer: Self-pay | Admitting: Gynecologic Oncology

## 2011-10-28 NOTE — Telephone Encounter (Signed)
Message left for patient with pap smear results: negative.  Instructed to call for any questions or concerns.  

## 2011-11-19 ENCOUNTER — Encounter (HOSPITAL_COMMUNITY): Payer: Medicare Other | Attending: Oncology

## 2011-11-19 DIAGNOSIS — C541 Malignant neoplasm of endometrium: Secondary | ICD-10-CM

## 2011-11-19 DIAGNOSIS — Z452 Encounter for adjustment and management of vascular access device: Secondary | ICD-10-CM

## 2011-11-19 DIAGNOSIS — C549 Malignant neoplasm of corpus uteri, unspecified: Secondary | ICD-10-CM | POA: Insufficient documentation

## 2011-11-19 MED ORDER — HEPARIN SOD (PORK) LOCK FLUSH 100 UNIT/ML IV SOLN
500.0000 [IU] | Freq: Once | INTRAVENOUS | Status: AC
  Administered 2011-11-19: 500 [IU] via INTRAVENOUS
  Filled 2011-11-19: qty 5

## 2011-11-19 MED ORDER — HEPARIN SOD (PORK) LOCK FLUSH 100 UNIT/ML IV SOLN
INTRAVENOUS | Status: AC
  Filled 2011-11-19: qty 5

## 2011-11-19 MED ORDER — SODIUM CHLORIDE 0.9 % IJ SOLN
INTRAMUSCULAR | Status: AC
  Filled 2011-11-19: qty 10

## 2011-11-19 MED ORDER — SODIUM CHLORIDE 0.9 % IJ SOLN
10.0000 mL | Freq: Once | INTRAMUSCULAR | Status: AC
  Administered 2011-11-19: 10 mL via INTRAVENOUS
  Filled 2011-11-19: qty 10

## 2011-11-19 NOTE — Progress Notes (Signed)
[name] presented for Portacath access and flush. Proper placement of portacath confirmed by CXR. Portacath located lt chest wall accessed with  H 20 needle. Good blood return present. Portacath flushed with 20ml NS and 500U/45ml Heparin and needle removed intact. Procedure without incident. Patient tolerated procedure well.

## 2011-12-26 ENCOUNTER — Encounter (HOSPITAL_COMMUNITY): Payer: Medicare Other | Attending: Oncology

## 2011-12-26 DIAGNOSIS — C541 Malignant neoplasm of endometrium: Secondary | ICD-10-CM

## 2011-12-26 DIAGNOSIS — Z452 Encounter for adjustment and management of vascular access device: Secondary | ICD-10-CM

## 2011-12-26 DIAGNOSIS — C549 Malignant neoplasm of corpus uteri, unspecified: Secondary | ICD-10-CM

## 2011-12-26 MED ORDER — HEPARIN SOD (PORK) LOCK FLUSH 100 UNIT/ML IV SOLN
INTRAVENOUS | Status: AC
  Filled 2011-12-26: qty 5

## 2011-12-26 MED ORDER — SODIUM CHLORIDE 0.9 % IJ SOLN
INTRAMUSCULAR | Status: AC
  Filled 2011-12-26: qty 10

## 2011-12-26 MED ORDER — SODIUM CHLORIDE 0.9 % IJ SOLN
10.0000 mL | INTRAMUSCULAR | Status: DC | PRN
  Administered 2011-12-26: 10 mL via INTRAVENOUS
  Filled 2011-12-26: qty 10

## 2011-12-26 MED ORDER — HEPARIN SOD (PORK) LOCK FLUSH 100 UNIT/ML IV SOLN
500.0000 [IU] | Freq: Once | INTRAVENOUS | Status: AC
  Administered 2011-12-26: 500 [IU] via INTRAVENOUS
  Filled 2011-12-26: qty 5

## 2011-12-26 NOTE — Progress Notes (Signed)
Tolerated port flush well.  Good blood return. 

## 2011-12-30 ENCOUNTER — Ambulatory Visit (HOSPITAL_COMMUNITY): Payer: Medicare Other | Admitting: Oncology

## 2011-12-31 ENCOUNTER — Other Ambulatory Visit (HOSPITAL_COMMUNITY): Payer: Medicare Other

## 2012-01-06 NOTE — H&P (Signed)
  NTS SOAP Note  Vital Signs:  Vitals as of: 09/12/2010: Systolic 163: Diastolic 71: Heart Rate 61: Temp 96.39F: Height 35ft 2.5in: Weight 169Lbs 0 Ounces: Pain Level 0: BMI 30  BMI : 30.42 kg/m2  Subjective: This 78 Years 42 Months old Female presents for of endometrial cancer.  Needs portacath insertion for chemotherapy.  Review of Symptoms:  Constitutional:unremarkable Head:unremarkable Eyes:unremarkable Nose/Mouth/Throat:unremarkable Cardiovascular:unremarkable Respiratory:unremarkable Gastrointestinal:unremarkable Genitourinary:unremarkable joint pain Skin:unremarkable Breast:unremarkable Hematolgic/Lymphatic:unremarkable Allergic/Immunologic:unremarkable    Past Medical History:Reviewed   Past Medical History  Surgical History:  Appendectomy, Hysterectomy, Coronary Artery Bypass, breast biopsy, throat tumor excision Medical Problems:  Diabetes, Heart murmur, High Blood pressure Allergies: statin meds Medications: norvasc, asa, metoprolol, glucaphage, glipizide, potassium chloride, lasix, vit. d   Social History:Reviewed   Social History  Preferred Language: English ([place]) Race:  White Ethnicity: Not Hispanic / Latino Age: 78 Years 10 Months Marital Status:  W Alcohol:  No Recreational drug(s):  No   Smoking Status: Never smoker reviewed on 09/12/2010  Family History:Reviewed   Family History              Mother:  Cancer, breast   Medication Allergies:   Allergies Insert Code:   Objective Information: General:Well appearing, well nourished in no distress. Skin:no rash or prominent lesions Head:Atraumatic; no masses; no abnormalities Eyes:conjunctiva clear, EOM intact, PERRL Neck:Supple without lymphadenopathy.  Heart:RRR, 2/6 SEM murmur Lungs:CTA bilaterally, no wheezes, rhonchi, rales.  Breathing unlabored. Abdomen:Soft, NT/ND, no HSM, no  masses.  Assessment:Endometrial carcinoma, need for portacath insertion  Diagnosis &amp; Procedure:   Orders:preop orders     Plan:Scheduled for portacath removal on 01/23/12.    Patient Education:Alternative treatments to surgery were discussed with patient (and family).Risks and benefits  of procedure were fully explained to the patient (and family) who gave informed consent. Patient/family questions were addressed.  Follow-up:Pending Surgery

## 2012-01-14 ENCOUNTER — Encounter (HOSPITAL_COMMUNITY): Payer: Self-pay | Admitting: Pharmacy Technician

## 2012-01-23 ENCOUNTER — Encounter (HOSPITAL_COMMUNITY)
Admission: RE | Disposition: A | Discharge status: Home / Self Care | Payer: Self-pay | Source: Ambulatory Visit | Attending: General Surgery

## 2012-01-23 ENCOUNTER — Ambulatory Visit (HOSPITAL_COMMUNITY)
Admission: RE | Admit: 2012-01-23 | Discharge: 2012-01-23 | Disposition: A | Discharge status: Home / Self Care | Payer: Medicare Other | Source: Ambulatory Visit | Attending: General Surgery | Admitting: General Surgery

## 2012-01-23 ENCOUNTER — Encounter (HOSPITAL_COMMUNITY): Payer: Self-pay | Admitting: *Deleted

## 2012-01-23 DIAGNOSIS — Z452 Encounter for adjustment and management of vascular access device: Secondary | ICD-10-CM | POA: Insufficient documentation

## 2012-01-23 DIAGNOSIS — I10 Essential (primary) hypertension: Secondary | ICD-10-CM | POA: Insufficient documentation

## 2012-01-23 DIAGNOSIS — E119 Type 2 diabetes mellitus without complications: Secondary | ICD-10-CM | POA: Insufficient documentation

## 2012-01-23 HISTORY — PX: PORT-A-CATH REMOVAL: SHX5289

## 2012-01-23 SURGERY — REMOVAL PORT-A-CATH
Anesthesia: LOCAL

## 2012-01-23 MED ORDER — LIDOCAINE HCL (PF) 1 % IJ SOLN
INTRAMUSCULAR | Status: AC
  Filled 2012-01-23: qty 30

## 2012-01-23 MED ORDER — LIDOCAINE HCL (PF) 1 % IJ SOLN
INTRAMUSCULAR | Status: DC | PRN
  Administered 2012-01-23: 8 mL via SUBCUTANEOUS

## 2012-01-23 SURGICAL SUPPLY — 12 items
CLOTH BEACON ORANGE TIMEOUT ST (SAFETY) ×2 IMPLANT
DERMABOND ADVANCED (GAUZE/BANDAGES/DRESSINGS) ×1
DERMABOND ADVANCED .7 DNX12 (GAUZE/BANDAGES/DRESSINGS) ×1 IMPLANT
DRAPE PROXIMA HALF (DRAPES) ×2 IMPLANT
DURAPREP 6ML APPLICATOR 50/CS (WOUND CARE) ×2 IMPLANT
ELECT REM PT RETURN 9FT ADLT (ELECTROSURGICAL) ×2
ELECTRODE REM PT RTRN 9FT ADLT (ELECTROSURGICAL) ×1 IMPLANT
GOWN STRL REIN XL XLG (GOWN DISPOSABLE) ×2 IMPLANT
SPONGE GAUZE 2X2 8PLY STRL LF (GAUZE/BANDAGES/DRESSINGS) ×2 IMPLANT
SUT VIC AB 3-0 SH 27 (SUTURE) ×1
SUT VIC AB 3-0 SH 27X BRD (SUTURE) ×1 IMPLANT
SUT VIC AB 4-0 PS2 27 (SUTURE) ×2 IMPLANT

## 2012-01-23 NOTE — Interval H&P Note (Signed)
History and Physical Interval Note:  01/23/2012 9:27 AM  [name]  has presented today for surgery, with the diagnosis of uterine cancer  The various methods of treatment have been discussed with the patient and family. After consideration of risks, benefits and other options for treatment, the patient has consented to  Procedure(s) (LRB) with comments: REMOVAL PORT-A-CATH (N/A) - Minor Room as a surgical intervention .  The patient's history has been reviewed, patient examined, no change in status, stable for surgery.  I have reviewed the patient's chart and labs.  Questions were answered to the patient's satisfaction.     Franky Macho A

## 2012-01-23 NOTE — Op Note (Signed)
Patient:  [name]  DOB:  [birth year]  MRN:  [id number]   Preop Diagnosis:  Uterine carcinoma, finished with chemotherapy  Postop Diagnosis:  Same  Procedure:  Port-A-Cath removal  Surgeon:  Franky Macho, M.D.  Anes:  Local  Indications:  Patient is a 78 year old white female presents for a Port-A-Cath removal. She is finished with chemotherapy. Risks and benefits of the procedure were fully explained to the patient, who gave informed consent.  Procedure note:  Patient is placed the supine position. The left upper chest was prepped and draped using usual sterile technique with DuraPrep. Surgical site confirmation was performed. 1% Xylocaine was used for local anesthesia.  An incision was made to the previous surgical scar. Dissection was taken down to the port. The Port-A-Cath was removed in total without difficulty. It was disposed of. The subcutaneous layer was reapproximated using 3-0 Vicryl interrupted suture. The skin was closed using a 4 Vicryl subcuticular suture. Dermabond was applied.  All tape and needle counts were correct at the end of the procedure. The patient was discharged in stable condition.  Complications:  None  EBL:  Minimal  Specimen:  None

## 2012-01-27 ENCOUNTER — Encounter (HOSPITAL_COMMUNITY): Payer: Self-pay | Admitting: General Surgery

## 2012-02-11 ENCOUNTER — Encounter (HOSPITAL_COMMUNITY): Payer: Medicare Other | Attending: Oncology

## 2012-02-11 ENCOUNTER — Other Ambulatory Visit (HOSPITAL_COMMUNITY): Payer: Medicare Other

## 2012-02-11 DIAGNOSIS — C541 Malignant neoplasm of endometrium: Secondary | ICD-10-CM

## 2012-02-11 DIAGNOSIS — C549 Malignant neoplasm of corpus uteri, unspecified: Secondary | ICD-10-CM | POA: Insufficient documentation

## 2012-02-11 LAB — DIFFERENTIAL
Basophils Absolute: 0 10*3/uL (ref 0.0–0.1)
Basophils Relative: 0 % (ref 0–1)
Eosinophils Relative: 6 % — ABNORMAL HIGH (ref 0–5)
Monocytes Absolute: 0.3 10*3/uL (ref 0.1–1.0)

## 2012-02-11 LAB — CBC
HCT: 33.2 % — ABNORMAL LOW (ref 36.0–46.0)
MCHC: 35.2 g/dL (ref 30.0–36.0)
MCV: 86.5 fL (ref 78.0–100.0)
RDW: 13 % (ref 11.5–15.5)

## 2012-02-11 LAB — COMPREHENSIVE METABOLIC PANEL
AST: 18 U/L (ref 0–37)
CO2: 26 mEq/L (ref 19–32)
Calcium: 9.3 mg/dL (ref 8.4–10.5)
Creatinine, Ser: 0.97 mg/dL (ref 0.50–1.10)
GFR calc non Af Amer: 54 mL/min — ABNORMAL LOW (ref >90)

## 2012-02-11 NOTE — Progress Notes (Signed)
Labs drawn today for cbc/diff,cmp 

## 2012-02-13 ENCOUNTER — Encounter (HOSPITAL_COMMUNITY): Payer: Self-pay | Admitting: Oncology

## 2012-02-13 ENCOUNTER — Encounter (HOSPITAL_BASED_OUTPATIENT_CLINIC_OR_DEPARTMENT_OTHER): Payer: Medicare Other | Admitting: Oncology

## 2012-02-13 VITALS — BP 131/66 | HR 62 | Temp 97.9°F | Resp 20 | Wt 172.0 lb

## 2012-02-13 DIAGNOSIS — C541 Malignant neoplasm of endometrium: Secondary | ICD-10-CM

## 2012-02-13 DIAGNOSIS — C775 Secondary and unspecified malignant neoplasm of intrapelvic lymph nodes: Secondary | ICD-10-CM

## 2012-02-13 DIAGNOSIS — C549 Malignant neoplasm of corpus uteri, unspecified: Secondary | ICD-10-CM

## 2012-02-13 NOTE — Patient Instructions (Signed)
[facility]  Discharge Instructions  RECOMMENDATIONS MADE BY THE CONSULTANT AND ANY TEST RESULTS WILL BE SENT TO YOUR REFERRING DOCTOR.   EXAM FINDINGS BY MD TODAY AND SIGNS AND SYMPTOMS TO REPORT TO CLINIC OR PRIMARY MD: Exam findings as discussed with Dr. Mariel Sleet.  SPECIAL INSTRUCTIONS/FOLLOW-UP: 1.  We are scheduling you for a CT and you were given contrast to take before your exam.   2.  You were also scheduled to return in 6 months. 3.  Schedule your follow-up appointments with [facility] as planned.  I acknowledge that I have been informed and understand all the instructions given to me and received a copy. I do not have any more questions at this time, but understand that I may call the Specialty [facility] at [facility] at [phone] during business hours should I have any further questions or need assistance in obtaining follow-up care.    __________________________________________  _____________  __________ Signature of Patient or Authorized Representative            Date                   Time    __________________________________________ Nurse's Signature

## 2012-02-13 NOTE — Progress Notes (Signed)
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 Nodes were negative however in the periaortic area but one of 5 left pelvic lymph nodes were positive for a total of 1 of 23 lymph nodes. She declined the GOG protocol but was treated with 3 cycles of carboplatin and paclitaxel followed by radiation therapy followed by 3 more cycles of carboplatin and paclitaxel which she has finished. She is due for a CAT scan of the abdomen and pelvis we'll schedule for next week. She feels great today has no complaints on oncology review of systems.  Vital signs are stable and recorded.BP 131/66  Pulse 62  Temp 97.9 F (36.6 C) (Oral)  Resp 20  Wt 172 lb (78.019 kg) she is in no acute distress. She is alert and oriented. 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 She has no lymphadenopathy in the cervical, supra-clavicular, infraclavicular, axillary, or inguinal areas. Her Port-A-Cath is out at her Port-A-Cath site looks intact. Her lungs are clear to auscultation and percussion. Heart shows a regular rhythm and rate without murmur rub or gallop. Abdomen is obese but without obvious hepatosplenomegaly. There is no ascites no distention no fluid wave. She states that her clothing size and the waist is stable. She has no leg edema or arm edema.  Her blood work the other day is excellent. Her CAT scan is scheduled now for next week. If all is well she will see Dr. Duard Brady in February and I will see her 6 months now.

## 2012-02-16 ENCOUNTER — Ambulatory Visit (INDEPENDENT_AMBULATORY_CARE_PROVIDER_SITE_OTHER): Payer: Medicare Other | Admitting: Cardiology

## 2012-02-16 ENCOUNTER — Encounter: Payer: Self-pay | Admitting: Cardiology

## 2012-02-16 VITALS — BP 135/69 | HR 57 | Ht 62.0 in | Wt 174.1 lb

## 2012-02-16 DIAGNOSIS — Z789 Other specified health status: Secondary | ICD-10-CM

## 2012-02-16 DIAGNOSIS — Z888 Allergy status to other drugs, medicaments and biological substances status: Secondary | ICD-10-CM

## 2012-02-16 DIAGNOSIS — I4891 Unspecified atrial fibrillation: Secondary | ICD-10-CM

## 2012-02-16 NOTE — Progress Notes (Signed)
HPI  Patient is seen in followup coronary disease. I saw her last in November, 2012. She underwent CABG in 2010. She had a Maze procedure for her atrial fibrillation. She was treated with amiodarone and Coumadin for a period of time. She is held sinus rhythm and the amiodarone and Coumadin were stopped. She's not having any palpitations. She's not having any chest pain. She's had endometrial cancer. She has received radiation and chemotherapy and she is now complete. She's doing very well and thought to be cancer free at this point.  Allergies  Allergen Reactions  . Statins     REACTION: fatique, no energy    Current Outpatient Prescriptions  Medication Sig Dispense Refill  . amLODipine (NORVASC) 5 MG tablet Take 5 mg by mouth daily after breakfast.       . aspirin 81 MG tablet Take 81 mg by mouth daily after breakfast.       . Calcium Carbonate (CALCIUM 500 PO) Take 500 mg by mouth 2 (two) times daily.      . Cholecalciferol (VITAMIN D PO) Take 400 Units by mouth daily.        . Flaxseed, Linseed, (FLAXSEED OIL) 1200 MG CAPS Take 1,200 mg by mouth daily.        . furosemide (LASIX) 40 MG tablet Take 40 mg by mouth daily with breakfast.       . glipiZIDE (GLUCOTROL) 5 MG tablet Take 5 mg by mouth 2 (two) times daily before a meal.       . Glucosamine 500 MG CAPS Take 500 mg by mouth 2 (two) times daily.        . metFORMIN (GLUCOPHAGE) 500 MG tablet Take 500 mg by mouth 2 (two) times daily with a meal.       . metoprolol tartrate (LOPRESSOR) 25 MG tablet Take 25 mg by mouth 2 (two) times daily.       . Omega-3 Fatty Acids (FISH OIL) 1200 MG CAPS Take 1,200 mg by mouth 2 (two) times daily.      . potassium chloride (KLOR-CON) 20 MEQ packet Take 20 mEq by mouth daily.        . Red Yeast Rice 600 MG TABS Take 1,200 mg by mouth at bedtime.       . Diphenhydramine-APAP, sleep, (TYLENOL PM EXTRA STRENGTH PO) Take 1 tablet by mouth at bedtime as needed. Sleep      . [DISCONTINUED] potassium  chloride SA (K-DUR,KLOR-CON) 20 MEQ tablet Take 20 mEq by mouth daily.         History   Social History  . Marital Status: Widowed    Spouse Name: N/A    Number of Children: N/A  . Years of Education: N/A   Occupational History  . Retired    Social History Main Topics  . Smoking status: Never Smoker   . Smokeless tobacco: Never Used  . Alcohol Use: No  . Drug Use: No  . Sexually Active: Not on file   Other Topics Concern  . Not on file   Social History Narrative  . No narrative on file    Family History  Problem Relation Age of Onset  . Hypertension Mother   . Diabetes Other   . Hypertension Other     Past Medical History  Diagnosis Date  . Hx of CABG     2010, Dr.Van Trigt, Maze procedure, ligation of left atrial appendage, Coumadin stopped January, 2011  . HTN (hypertension)   .  Endometrial ca 09/25/2010    June, 2012, radiation and chemotherapy, Dr. Mariel Sleet  . Coronary artery disease   . Hyperlipidemia   . Noninsulin dependent diabetes mellitus   . Osteoarthritis     Severe  . Fluid overload   . Renal insufficiency     Mild with higher dose diuretic  . Ejection fraction     EF 60%, echo, September, 2010  . Atrial fibrillation     Maze procedure September, 2010, amiodarone and Coumadin stopped eventually January, 2011  . Shortness of breath   . Statin intolerance     Is    Past Surgical History  Procedure Date  . Appendectomy 60 yrs ago  . Tonsilectomy, adenoidectomy, bilateral myringotomy and tubes   . Breast mass excision     left breast 1985  . Tumor removal     from throat  benign  . Choleycystectomy 1998  . Cardiac surgery 2010    triple bypass  . Abdominal hysterectomy     complete  . Coronary artery bypass graft 12/21/08    Donata Clay; LIMA LAD, SVG posterior descending, SVG OM, maze procedure, ligation left atrial appendage  . Cardiac catheterization   . Cataract extraction   . Portacath placement   . Port-a-cath removal 01/23/2012     Procedure: REMOVAL PORT-A-CATH;  Surgeon: Dalia Heading, MD;  Location: [facility];  Service: General;  Laterality: N/A;  Minor Room    Patient Active Problem List  Diagnosis  . DM  . HYPERLIPIDEMIA  . HYPERTENSION  . RENAL INSUFFICIENCY  . SHORTNESS OF BREATH  . Endometrial ca  . Hx of CABG  . HTN (hypertension)  . Coronary artery disease  . Hyperlipidemia  . Fluid overload  . Renal insufficiency  . Ejection fraction  . Atrial fibrillation  . Statin intolerance    ROS  Patient denies fever, chills, headache, sweats, rash, change in vision, change in hearing, chest pain, cough, nausea vomiting, urinary symptoms. All the systems are reviewed and are negative.  PHYSICAL EXAM  Patient is oriented to person time and place. Affect is normal. There is no jugular venous distention. Lungs are clear. Respiratory effort is nonlabored. Cardiac exam reveals S1 and S2. There are no clicks or significant murmurs. Abdomen is soft. There is no peripheral edema.  Filed Vitals:   02/16/12 1308  BP: 135/69  Pulse: 57  Height: 5\' 2"  (1.575 m)  Weight: 174 lb 1.9 oz (78.98 kg)    EKG is done today and reviewed by me.  ASSESSMENT & PLAN

## 2012-02-16 NOTE — Patient Instructions (Addendum)

## 2012-02-18 ENCOUNTER — Ambulatory Visit (HOSPITAL_COMMUNITY)
Admission: RE | Admit: 2012-02-18 | Discharge: 2012-02-18 | Disposition: A | Discharge status: Home / Self Care | Payer: Medicare Other | Source: Ambulatory Visit | Attending: Oncology | Admitting: Oncology

## 2012-02-18 DIAGNOSIS — C541 Malignant neoplasm of endometrium: Secondary | ICD-10-CM

## 2012-02-18 DIAGNOSIS — Z923 Personal history of irradiation: Secondary | ICD-10-CM | POA: Insufficient documentation

## 2012-02-18 DIAGNOSIS — R933 Abnormal findings on diagnostic imaging of other parts of digestive tract: Secondary | ICD-10-CM | POA: Insufficient documentation

## 2012-02-18 DIAGNOSIS — C549 Malignant neoplasm of corpus uteri, unspecified: Secondary | ICD-10-CM | POA: Insufficient documentation

## 2012-02-20 ENCOUNTER — Other Ambulatory Visit (HOSPITAL_COMMUNITY): Payer: Self-pay | Admitting: *Deleted

## 2012-02-24 ENCOUNTER — Ambulatory Visit: Payer: Medicare Other | Admitting: Gastroenterology

## 2012-02-24 ENCOUNTER — Encounter: Payer: Self-pay | Admitting: Gastroenterology

## 2012-02-24 ENCOUNTER — Other Ambulatory Visit: Payer: Self-pay | Admitting: Gastroenterology

## 2012-02-24 ENCOUNTER — Ambulatory Visit (INDEPENDENT_AMBULATORY_CARE_PROVIDER_SITE_OTHER): Payer: Medicare Other | Admitting: Gastroenterology

## 2012-02-24 VITALS — BP 149/56 | HR 53 | Temp 96.9°F | Ht 62.0 in | Wt 175.0 lb

## 2012-02-24 DIAGNOSIS — R9389 Abnormal findings on diagnostic imaging of other specified body structures: Secondary | ICD-10-CM

## 2012-02-24 DIAGNOSIS — K449 Diaphragmatic hernia without obstruction or gangrene: Secondary | ICD-10-CM | POA: Insufficient documentation

## 2012-02-24 DIAGNOSIS — K229 Disease of esophagus, unspecified: Secondary | ICD-10-CM | POA: Insufficient documentation

## 2012-02-24 DIAGNOSIS — D649 Anemia, unspecified: Secondary | ICD-10-CM | POA: Insufficient documentation

## 2012-02-24 NOTE — Progress Notes (Signed)
Primary Care Physician:  Toma Deiters, MD  Primary Gastroenterologist:  Jonette Eva, MD   Chief Complaint  Patient presents with  . abnormal CT/ pt said hiatal hernia    HPI:  [name] is a 78 y.o. female here at the request of Dr. Glenford Peers for further evaluation of abnormal esophagus and hiatal hernia on CT. Patient has a history of endometrial adenocarcinoma of the uterus, grade 2 with 0.9 cm a myometrial invasion with LV1. 1-5 left pelvic lymph nodes were positive for total one of 23. She had complete hysterectomy in April 2012. She is also underwent radiation therapy which she completed in October 2012. She completed her chemotherapy in January 2013. Course was complicated by pelvic abscess. She required CT biopsy of this back in March.  She recently had her followup CT scan done. There was no process or evidence of metastatic disease. 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 Slightly improved from March 2013. Resolved bilateral pelvic sidewall fluid collection, minimal left hydroureter remains, similar to 5 2013 CT. She has a small hiatal hernia, apparent soft tissue fullness at the GE junction which could be due to underdistention but mass cannot be excluded. EGD has been recommended.  She states she had a remote EGD over 10 years ago by Dr. Linna Darner. She denies heartburn, esophageal dysphagia, vomiting, abdominal pain, constipation, diarrhea, melena, rectal bleeding. Denies weight loss. Her appetite is good.  Current Outpatient Prescriptions  Medication Sig Dispense Refill  . amLODipine (NORVASC) 5 MG tablet Take 5 mg by mouth daily after breakfast.       . aspirin 81 MG tablet Take 81 mg by mouth daily after breakfast.       . Calcium Carbonate (CALCIUM 500 PO) Take 500 mg by mouth 2 (two) times daily.      . Cholecalciferol (VITAMIN D PO) Take 400 Units by mouth daily.        . Diphenhydramine-APAP, sleep, (TYLENOL PM EXTRA STRENGTH PO)  Take 1 tablet by mouth at bedtime as needed. Sleep      . Flaxseed, Linseed, (FLAXSEED OIL) 1200 MG CAPS Take 1,200 mg by mouth daily.        . furosemide (LASIX) 40 MG tablet Take 40 mg by mouth daily with breakfast.       . glipiZIDE (GLUCOTROL) 5 MG tablet Take 5 mg by mouth 2 (two) times daily before a meal.       . Glucosamine 500 MG CAPS Take 500 mg by mouth 2 (two) times daily.        . metFORMIN (GLUCOPHAGE) 500 MG tablet Take 500 mg by mouth 1 day or 1 dose. Once daily at bedtime      . metoprolol tartrate (LOPRESSOR) 25 MG tablet Take 25 mg by mouth 2 (two) times daily.       . Omega-3 Fatty Acids (FISH OIL) 1200 MG CAPS Take 1,200 mg by mouth 2 (two) times daily.      . potassium chloride (KLOR-CON) 20 MEQ packet Take 20 mEq by mouth daily.        . Red Yeast Rice 600 MG TABS Take 1,200 mg by mouth at bedtime.       . [DISCONTINUED] potassium chloride SA (K-DUR,KLOR-CON) 20 MEQ tablet Take 20 mEq by mouth daily.         Allergies as of 02/24/2012 - Review Complete 02/24/2012  Allergen Reaction Noted  . Statins      Past Medical  History  Diagnosis Date  . Hx of CABG     2010, Dr.Van Trigt, Maze procedure, ligation of left atrial appendage, Coumadin stopped January, 2011  . HTN (hypertension)   . Endometrial ca 09/25/2010    June, 2012, radiation and chemotherapy, Dr. Mariel Sleet  . Coronary artery disease   . Hyperlipidemia   . Noninsulin dependent diabetes mellitus   . Osteoarthritis     Severe  . Fluid overload   . Renal insufficiency     Mild with higher dose diuretic  . Ejection fraction     EF 60%, echo, September, 2010  . Atrial fibrillation     Maze procedure September, 2010, amiodarone and Coumadin stopped eventually January, 2011  . Shortness of breath   . Statin intolerance     Is    Past Surgical History  Procedure Date  . Appendectomy 60 yrs ago  . Tonsilectomy, adenoidectomy, bilateral myringotomy and tubes   . Breast mass excision     left breast  1985  . Tumor removal     from throat  benign  . Choleycystectomy 1998  . Abdominal hysterectomy 07/2010    complete, endometrial cancer  . Coronary artery bypass graft 12/21/08    Donata Clay; LIMA LAD, SVG posterior descending, SVG OM, maze procedure, ligation left atrial appendage and MAZE procedure for A.fib  . Cardiac catheterization   . Cataract extraction   . Portacath placement   . Port-a-cath removal 01/23/2012    Procedure: REMOVAL PORT-A-CATH;  Surgeon: Dalia Heading, MD;  Location: [facility];  Service: General;  Laterality: N/A;  Minor Room  . Colonoscopy     several in [place], last one by Dr. Linna Darner, polyps once in [place]. records requested.    Family History  Problem Relation Age of Onset  . Hypertension Mother   . Diabetes Sister     deceased - 73, renal failure,   . Breast cancer Mother   . Colon cancer Neg Hx   . Heart attack Mother     deceased, age 31  . Ovarian cancer Neg Hx   . Lung cancer Neg Hx   . Liver disease Neg Hx     History   Social History  . Marital Status: Widowed    Spouse Name: N/A    Number of Children: 1  . Years of Education: N/A   Occupational History  . Retired    Social History Main Topics  . Smoking status: Never Smoker   . Smokeless tobacco: Never Used  . Alcohol Use: No  . Drug Use: No  . Sexually Active: Not on file   Other Topics Concern  . Not on file   Social History Narrative  . No narrative on file      ROS:  General: Negative for anorexia, weight loss, fever, chills, fatigue, weakness. Eyes: Negative for vision changes.  ENT: Negative for hoarseness, difficulty swallowing , nasal congestion. CV: Negative for chest pain, angina, palpitations, dyspnea on exertion, peripheral edema.  Respiratory: Negative for dyspnea at rest, dyspnea on exertion, cough, sputum, wheezing.  GI: See history of present illness. GU:  Negative for dysuria, hematuria, urinary incontinence, urinary frequency, nocturnal urination.    MS: Negative for joint pain, low back pain.  Derm: Negative for rash or itching.  Neuro: Negative for weakness, abnormal sensation, seizure, frequent headaches, memory loss, confusion.  Psych: Negative for anxiety, depression, suicidal ideation, hallucinations.  Endo: Negative for unusual weight change.  Heme: Negative for bruising or bleeding.  Allergy: Negative for rash or hives.    Physical Examination:  BP 149/56  Pulse 53  Temp 96.9 F (36.1 C) (Tympanic)  Ht 5\' 2"  (1.575 m)  Wt 175 lb (79.379 kg)  BMI 32.01 kg/m2   General: Well-nourished, well-developed in no acute distress. Accompanied by sister. Head: Normocephalic, atraumatic.   Eyes: Conjunctiva pink, no icterus. Mouth: Oropharyngeal mucosa moist and pink , no lesions erythema or exudate. Neck: Supple without thyromegaly, masses, or lymphadenopathy.  Lungs: Clear to auscultation bilaterally.  Heart: Regular rate and rhythm, no murmurs rubs or gallops.  Abdomen: Bowel sounds are normal, nontender, nondistended, no hepatosplenomegaly or masses, no abdominal bruits or    hernia , no rebound or guarding.   Rectal: not performed Extremities: No lower extremity edema. No clubbing or deformities.  Neuro: Alert and oriented x 4 , grossly normal neurologically.  Skin: Warm and dry, no rash or jaundice.   Psych: Alert and cooperative, normal mood and affect.  Labs: Lab Results  Component Value Date   WBC 3.6* 02/11/2012   HGB 11.7* 02/11/2012   HCT 33.2* 02/11/2012   MCV 86.5 02/11/2012   PLT 159 02/11/2012   Lab Results  Component Value Date   CREATININE 0.97 02/11/2012   BUN 22 02/11/2012   NA 136 02/11/2012   K 4.4 02/11/2012   CL 102 02/11/2012   CO2 26 02/11/2012   Lab Results  Component Value Date   ALT 13 02/11/2012   AST 18 02/11/2012   ALKPHOS 69 02/11/2012   BILITOT 0.3 02/11/2012     Imaging Studies: Ct Abdomen Pelvis Wo Contrast  02/18/2012  *RADIOLOGY REPORT*  Clinical Data: History endometrial cancer  diagnosed 4/12 with complete hysterectomy and appendectomy.  Cholecystectomy. Hypertension.  Diabetes.  CT ABDOMEN AND PELVIS WITHOUT CONTRAST  Technique:  Multidetector CT imaging of the abdomen and pelvis was performed following the standard protocol without intravenous contrast.  Comparison: 05/13/2013pelvic CT and 07/01/2011 abdominal pelvic CT.  Findings: Lung bases:  Clear lung bases.  Mild cardiomegaly.  Small hiatal hernia.  Abdomen/pelvis:  Normal uninfused appearance of the liver, spleen. There is soft tissue fullness at the gastroesophageal junction, which measures 2.8 cm on image 15/series 2.  This was not present on the prior exam.  Partially fatty replaced pancreas. Cholecystectomy without biliary ductal dilatation.  Normal adrenal glands.  Normal kidneys.  Minimal left hydroureter persists, improved since 07/01/2011 and similar to 08/18/2011.  Aortic atherosclerosis. No retroperitoneal or retrocrural adenopathy.  Extensive colonic diverticulosis with sigmoid muscular hypertrophy. Normal terminal ileum.  Appendix surgically absent by history. Redemonstration of wall thickening involving the pelvic small bowel loops.  Slightly improved since 07/01/2011. No pneumatosis or free intraperitoneal air.  No pelvic adenopathy.  The right pelvic side wall seroma has resolved. There is mild residual left pelvic sidewall fascial thickening, image 65/series 2.  Hysterectomy. Mild pelvic floor descent/laxity.  Trace cul-de-sac fluid on image 74, decreased since 07/01/2011.  Bones/Musculoskeletal:  Mild osteopenia.  Marked lower lumbar spondylosis, with grade 1 L4-L5 anterolisthesis, chronic.  IMPRESSION:  1. No acute process or evidence of metastatic disease in the abdomen or pelvis. 2.  Redemonstration of pelvic small bowel wall thickening, most consistent with radiation induced enteritis.  Slightly improved since 07/01/2011. 3.  Resolved bilateral pelvic sidewall fluid collections.  Minimal left hydroureter  remains, similar to 08/18/2011. 4.  Pelvic floor laxity. 5.  Small hiatal hernia.  Apparent soft tissue fullness at the gastroesophageal junction.  This could be due to underdistension.  A junction mass cannot be excluded.  Consider further evaluation with endoscopy or (if endoscopy not possible) upper GI.   Original Report Authenticated By: Jeronimo Greaves, M.D.

## 2012-02-24 NOTE — Assessment & Plan Note (Signed)
Mild anemia in the setting of chemotherapy which was completed in January 2013. Numbers have improved. We will retrieve which was done early in 2012.  Await EGD findings.

## 2012-02-24 NOTE — Assessment & Plan Note (Signed)
Abnormal soft is at GE junction, small hiatal hernia recently found that time with CT. Actually reviewing older CTs in 2012 showed esophageal wall thickening and gastric wall thickening at that time. She is asymptomatic. Recommend EGD for further evaluation.  I have discussed the risks, alternatives, benefits with regards to but not limited to the risk of reaction to medication, bleeding, infection, perforation and the patient is agreeable to proceed. Written consent to be obtained.

## 2012-02-24 NOTE — Progress Notes (Signed)
Faxed to PCP

## 2012-02-24 NOTE — Patient Instructions (Signed)
We've scheduled you for an upper endoscopy with Dr. Darrick Penna. Please see separate instructions.

## 2012-02-25 ENCOUNTER — Encounter (HOSPITAL_COMMUNITY): Payer: Self-pay | Admitting: Pharmacy Technician

## 2012-02-27 ENCOUNTER — Encounter: Payer: Self-pay | Admitting: Gastroenterology

## 2012-02-27 NOTE — Progress Notes (Signed)
Updated PSH. Incomplete colonoscopy by Dr. Linna Darner, could not pass scope passed the descending colon. CT done as well as ACBE. No worrisome findings.

## 2012-03-02 ENCOUNTER — Ambulatory Visit (HOSPITAL_COMMUNITY): Payer: Medicare Other | Admitting: Oncology

## 2012-03-02 ENCOUNTER — Telehealth: Payer: Self-pay | Admitting: Gynecologic Oncology

## 2012-03-02 NOTE — Telephone Encounter (Signed)
Called to schedule the patient for a follow up appointment with Dr. Duard Brady but unable to leave a message.  Will try again at a later time.

## 2012-03-08 ENCOUNTER — Encounter (HOSPITAL_COMMUNITY): Payer: Self-pay | Admitting: *Deleted

## 2012-03-08 ENCOUNTER — Ambulatory Visit (HOSPITAL_COMMUNITY)
Admission: RE | Admit: 2012-03-08 | Discharge: 2012-03-08 | Disposition: A | Discharge status: Home / Self Care | Payer: Medicare Other | Source: Ambulatory Visit | Attending: Gastroenterology | Admitting: Gastroenterology

## 2012-03-08 ENCOUNTER — Encounter (HOSPITAL_COMMUNITY)
Admission: RE | Disposition: A | Discharge status: Home / Self Care | Payer: Self-pay | Source: Ambulatory Visit | Attending: Gastroenterology

## 2012-03-08 DIAGNOSIS — R933 Abnormal findings on diagnostic imaging of other parts of digestive tract: Secondary | ICD-10-CM

## 2012-03-08 DIAGNOSIS — K2960 Other gastritis without bleeding: Secondary | ICD-10-CM | POA: Insufficient documentation

## 2012-03-08 DIAGNOSIS — E119 Type 2 diabetes mellitus without complications: Secondary | ICD-10-CM | POA: Insufficient documentation

## 2012-03-08 DIAGNOSIS — R9389 Abnormal findings on diagnostic imaging of other specified body structures: Secondary | ICD-10-CM

## 2012-03-08 DIAGNOSIS — K2970 Gastritis, unspecified, without bleeding: Secondary | ICD-10-CM

## 2012-03-08 DIAGNOSIS — K2990 Gastroduodenitis, unspecified, without bleeding: Secondary | ICD-10-CM

## 2012-03-08 DIAGNOSIS — K222 Esophageal obstruction: Secondary | ICD-10-CM

## 2012-03-08 DIAGNOSIS — I10 Essential (primary) hypertension: Secondary | ICD-10-CM | POA: Insufficient documentation

## 2012-03-08 HISTORY — PX: ESOPHAGOGASTRODUODENOSCOPY: SHX5428

## 2012-03-08 LAB — GLUCOSE, CAPILLARY: Glucose-Capillary: 91 mg/dL (ref 70–99)

## 2012-03-08 SURGERY — EGD (ESOPHAGOGASTRODUODENOSCOPY)
Anesthesia: Moderate Sedation

## 2012-03-08 MED ORDER — SODIUM CHLORIDE 0.45 % IV SOLN
INTRAVENOUS | Status: DC
Start: 2012-03-08 — End: 2012-03-08
  Administered 2012-03-08: 09:00:00 via INTRAVENOUS

## 2012-03-08 MED ORDER — MIDAZOLAM HCL 5 MG/5ML IJ SOLN
INTRAMUSCULAR | Status: DC | PRN
  Administered 2012-03-08: 1 mg via INTRAVENOUS
  Administered 2012-03-08 (×2): 2 mg via INTRAVENOUS

## 2012-03-08 MED ORDER — STERILE WATER FOR IRRIGATION IR SOLN
Status: DC | PRN
  Administered 2012-03-08: 10:00:00

## 2012-03-08 MED ORDER — MEPERIDINE HCL 100 MG/ML IJ SOLN
INTRAMUSCULAR | Status: AC
  Filled 2012-03-08: qty 1

## 2012-03-08 MED ORDER — MEPERIDINE HCL 100 MG/ML IJ SOLN
INTRAMUSCULAR | Status: DC | PRN
  Administered 2012-03-08 (×2): 25 mg via INTRAVENOUS

## 2012-03-08 MED ORDER — BUTAMBEN-TETRACAINE-BENZOCAINE 2-2-14 % EX AERO
INHALATION_SPRAY | CUTANEOUS | Status: DC | PRN
  Administered 2012-03-08: 1 via TOPICAL

## 2012-03-08 MED ORDER — OMEPRAZOLE 20 MG PO CPDR: DELAYED_RELEASE_CAPSULE | ORAL | Status: DC

## 2012-03-08 MED ORDER — MIDAZOLAM HCL 5 MG/5ML IJ SOLN
INTRAMUSCULAR | Status: AC
  Filled 2012-03-08: qty 10

## 2012-03-08 NOTE — Progress Notes (Signed)
Rx called into [facility] 

## 2012-03-08 NOTE — Op Note (Addendum)
[facility] [address] [place], [address]   ENDOSCOPY PROCEDURE REPORT  PATIENT: [name], [name].  MR#: [id number] BIRTHDATE: 03-Sep-1930 , 81  yrs. old GENDER: Female  ENDOSCOPIST: Jonette Eva, MD REFERRED GN:FAOZ [name], M.D.  Glenford Peers, M.D.  PROCEDURE DATE: 03/08/2012 PROCEDURE:   EGD w/ biopsy  INDICATIONS: abnormal esophagus on CT. I PERSONALLY REVIEWED THE CT WITH DR. Darl Pikes.  MEDICATIONS: Demerol 50 mg IV and Versed 5 mg IV TOPICAL ANESTHETIC:   Cetacaine Spray  DESCRIPTION OF PROCEDURE:     Physical exam was performed.  Informed consent was obtained from the patient after explaining the benefits, risks, and alternatives to the procedure.  The patient was connected to the monitor and placed in the left lateral position.  Continuous oxygen was provided by nasal cannula and IV medicine administered through an indwelling cannula.  After administration of sedation, the patients esophagus was intubated and the [id number] ([id number])  endoscope was advanced under direct visualization to the second portion of the duodenum.  The scope was removed slowly by carefully examining the color, texture, anatomy, and integrity of the mucosa on the way out.  The patient was recovered in endoscopy and discharged home in satisfactory condition.      ESOPHAGUS: A Schatzki ring was found at the gastroesophageal junction and was widely open.  STOMACH: Moderate nodular gastritis (inflammation) was found in the entire examined stomach.  Multiple biopsies were performed using cold forceps.  DUODENUM: The duodenal mucosa showed no abnormalities in the bulb and second portion of the duodenum.  COMPLICATIONS:   None  ENDOSCOPIC IMPRESSION: 1.   Schatzki ring was found at the gastroesophageal junction 2.   Nodular gastritis (inflammation) was found in the entire examined stomach; multiple biopsies 3.   The duodenal mucosa showed no abnormalities in the bulb  and second portion of the duodenum  RECOMMENDATIONS: AWAIT BIOPSY. CONSIDER EUS. WILL DISCUSS WITH DR. Mariel Sleet.  HOLD ASPIRIN.  RESTART DEC 9.  TAKE OMEPRAZOLE DAILY.  AVOID TRIGGERS FOR GASTRITIS.  FOLLOW UP IN 4 MOS.   REPEAT EXAM:   _______________________________ Rosalie DoctorJonette Eva, MD 03/08/2012 3:13 PM Revised: 03/08/2012 3:13 PM      PATIENT NAME:  [name]. MR#: [id number]

## 2012-03-08 NOTE — H&P (Signed)
Primary Care Physician:  Toma Deiters, MD Primary Gastroenterologist:  Dr. Darrick Penna  Pre-Procedure History & Physical: HPI:  [name] is a 78 y.o. female here for Abnormal CT scan: SOFT TISSUE MASS AT GE JXN.  Past Medical History  Diagnosis Date  . Hx of CABG     2010, Dr.Van Trigt, Maze procedure, ligation of left atrial appendage, Coumadin stopped January, 2011  . HTN (hypertension)   . Endometrial ca 09/25/2010    June, 2012, radiation and chemotherapy, Dr. Mariel Sleet  . Coronary artery disease   . Hyperlipidemia   . Noninsulin dependent diabetes mellitus   . Osteoarthritis     Severe  . Fluid overload   . Renal insufficiency     Mild with higher dose diuretic  . Ejection fraction     EF 60%, echo, September, 2010  . Atrial fibrillation     Maze procedure September, 2010, amiodarone and Coumadin stopped eventually January, 2011  . Shortness of breath   . Statin intolerance     Is    Past Surgical History  Procedure Date  . Appendectomy 60 yrs ago  . Tonsilectomy, adenoidectomy, bilateral myringotomy and tubes   . Breast mass excision     left breast 1985  . Tumor removal     from throat  benign  . Choleycystectomy 1998  . Abdominal hysterectomy 07/2010    complete, endometrial cancer  . Coronary artery bypass graft 12/21/08    Donata Clay; LIMA LAD, SVG posterior descending, SVG OM, maze procedure, ligation left atrial appendage and MAZE procedure for A.fib  . Cardiac catheterization   . Cataract extraction   . Portacath placement   . Port-a-cath removal 01/23/2012    Procedure: REMOVAL PORT-A-CATH;  Surgeon: Dalia Heading, MD;  Location: [facility];  Service: General;  Laterality: N/A;  Minor Room  . Colonoscopy     several in [place], last one by Dr. Linna Darner, polyps once in [place]. records requested.  . Colonoscopy 06/2010    Dr. Lyn Hollingshead good. Moderate diverticulosis in left colon. Inflammation found in descending colon, scope could not be advanced  forward.  ACBE-->sigmoid diverticulosis no worrisome findings.     Prior to Admission medications   Medication Sig Start Date End Date Taking? Authorizing Provider  amLODipine (NORVASC) 5 MG tablet Take 5 mg by mouth daily after breakfast.    Yes Historical Provider, MD  aspirin 81 MG tablet Take 81 mg by mouth daily after breakfast.    Yes Historical Provider, MD  Calcium Carbonate (CALCIUM 500 PO) Take 500 mg by mouth 2 (two) times daily.   Yes Historical Provider, MD  Cholecalciferol (VITAMIN D PO) Take 400 Units by mouth daily.    Yes Historical Provider, MD  Diphenhydramine-APAP, sleep, (TYLENOL PM EXTRA STRENGTH PO) Take 1 tablet by mouth at bedtime as needed. Sleep   Yes Historical Provider, MD  Flaxseed, Linseed, (FLAXSEED OIL) 1200 MG CAPS Take 1,200 mg by mouth daily.     Yes Historical Provider, MD  furosemide (LASIX) 40 MG tablet Take 40 mg by mouth daily with breakfast.    Yes Historical Provider, MD  glipiZIDE (GLUCOTROL) 5 MG tablet Take 5 mg by mouth 2 (two) times daily before a meal.    Yes Historical Provider, MD  Glucosamine 500 MG CAPS Take 500 mg by mouth 2 (two) times daily.     Yes Historical Provider, MD  metFORMIN (GLUCOPHAGE) 500 MG tablet Take 500 mg by mouth 2 (two) times daily. Once daily  at bedtime   Yes Historical Provider, MD  metoprolol tartrate (LOPRESSOR) 25 MG tablet Take 25 mg by mouth 2 (two) times daily.    Yes Historical Provider, MD  Omega-3 Fatty Acids (FISH OIL) 1200 MG CAPS Take 1,200 mg by mouth 2 (two) times daily.   Yes Historical Provider, MD  potassium chloride (KLOR-CON) 20 MEQ packet Take 20 mEq by mouth daily.     Yes Historical Provider, MD    Allergies as of 02/24/2012 - Review Complete 02/24/2012  Allergen Reaction Noted  . Statins      Family History  Problem Relation Age of Onset  . Hypertension Mother   . Diabetes Sister     deceased - 52, renal failure,   . Breast cancer Mother   . Colon cancer Neg Hx   . Heart attack Mother       deceased, age 59  . Ovarian cancer Neg Hx   . Lung cancer Neg Hx   . Liver disease Neg Hx     History   Social History  . Marital Status: Widowed    Spouse Name: N/A    Number of Children: 1  . Years of Education: N/A   Occupational History  . Retired    Social History Main Topics  . Smoking status: Never Smoker   . Smokeless tobacco: Never Used  . Alcohol Use: No  . Drug Use: No  . Sexually Active: Not on file   Other Topics Concern  . Not on file   Social History Narrative  . No narrative on file    Review of Systems: See HPI, otherwise negative ROS   Physical Exam: BP 143/66  Temp 97.6 F (36.4 C) (Oral)  Resp 22  Ht 5\' 2"  (1.575 m)  Wt 175 lb (79.379 kg)  BMI 32.01 kg/m2  SpO2 97% General:   Alert,  pleasant and cooperative in NAD Head:  Normocephalic and atraumatic. Neck:  Supple; Lungs:  Clear throughout to auscultation.    Heart:  Regular rate and rhythm. Abdomen:  Soft, nontender and nondistended. Normal bowel sounds, without guarding, and without rebound.   Neurologic:  Alert and  oriented x4;  grossly normal neurologically.  Impression/Plan:    Abnormal CT scan  Plan:  EGD TODAY

## 2012-03-10 ENCOUNTER — Telehealth: Payer: Self-pay | Admitting: Gastroenterology

## 2012-03-10 ENCOUNTER — Encounter (HOSPITAL_COMMUNITY): Payer: Self-pay | Admitting: Gastroenterology

## 2012-03-10 NOTE — Progress Notes (Signed)
DISCUSSED CASE WITH DR. Mariel Sleet. EGD SHOWED HIATAL HERNIA AND NO SUBMUCOSAL PROMINENCE. FINDING ON CT MOST LIKELY DUE TO HIATAL HERNIA. BX SHOWED ATROPHIC GASTRITIS. NO ADDITIONAL WORKUP NEEDED AT THIS TIME.  REVIEWED.

## 2012-03-10 NOTE — Telephone Encounter (Signed)
Please call pt. HER stomach Bx shows ATROPHIC gastritis, WHICH IS A process of chronic inflammation of the stomach mucosa,  I DISCUSSED HER CASE WITH DR. Mariel Sleet. EGD SHOWED HIATAL HERNIA AND NO SUBMUCOSAL PROMINENCE. FINDING ON CT MOST LIKELY DUE TO HIATAL HERNIA. BX SHOWED ATROPHIC GASTRITIS. WE FEEL NO ADDITIONAL WORKUP IS NEEDED AT THIS TIME.  HOLD ASPIRIN. RESTART DEC 9.  TAKE OMEPRAZOLE DAILY.  AVOID TRIGGERS FOR GASTRITIS.   FOLLOW UP IN 4 MOS W/ SLF.

## 2012-03-11 NOTE — Telephone Encounter (Signed)
Called and informed pt.  

## 2012-03-11 NOTE — Telephone Encounter (Signed)
Faxed to PCP, recall made  

## 2012-03-24 ENCOUNTER — Encounter (HOSPITAL_COMMUNITY): Payer: Medicare Other

## 2012-05-20 ENCOUNTER — Ambulatory Visit: Payer: Medicare Other | Admitting: Gynecologic Oncology

## 2012-06-24 ENCOUNTER — Ambulatory Visit: Payer: Medicare Other | Attending: Gynecologic Oncology | Admitting: Gynecologic Oncology

## 2012-06-24 ENCOUNTER — Other Ambulatory Visit (HOSPITAL_COMMUNITY)
Admission: RE | Admit: 2012-06-24 | Discharge: 2012-06-24 | Disposition: A | Discharge status: Home / Self Care | Payer: Medicare Other | Source: Ambulatory Visit | Attending: Gynecologic Oncology | Admitting: Gynecologic Oncology

## 2012-06-24 ENCOUNTER — Encounter: Payer: Self-pay | Admitting: Gynecologic Oncology

## 2012-06-24 VITALS — BP 120/60 | HR 60 | Temp 97.7°F | Resp 16 | Ht 62.28 in | Wt 175.4 lb

## 2012-06-24 DIAGNOSIS — C549 Malignant neoplasm of corpus uteri, unspecified: Secondary | ICD-10-CM | POA: Insufficient documentation

## 2012-06-24 DIAGNOSIS — Z124 Encounter for screening for malignant neoplasm of cervix: Secondary | ICD-10-CM | POA: Insufficient documentation

## 2012-06-24 DIAGNOSIS — C541 Malignant neoplasm of endometrium: Secondary | ICD-10-CM

## 2012-06-24 NOTE — Patient Instructions (Signed)
Cancer of the Uterus The uterus is part of a woman's reproductive system. It is the hollow, pear-shaped organ where a baby grows. The uterus is in the pelvis between the bladder and the rectum. The narrow, lower portion of the uterus is the cervix. The fallopian tubes extend from either side of the top of the uterus to the ovaries. The wall of the uterus has two layers of tissue. The inner layer, or lining, is the endometrium. The outer layer is muscle tissue called the myometrium. In women of childbearing age, the lining of the uterus grows and thickens each month to prepare for pregnancy. If a woman does not become pregnant, the thick, bloody lining flows out of the body through the vagina. This flow is called menstruation. TYPES OF UTERINE CANCER  The most common type of cancer of the uterus begins in the lining (endometrium). It is called endometrial cancer, uterine cancer, or cancer of the uterus. It is seen in 2% to 3% of women.  A different type of cancer, uterine sarcoma, develops in the muscle (myometrium). Cancer that begins in the cervix is also a different type of cancer.  Rarely, a noncancerous fibroid tumor of the uterus develops into a sarcoma. CAUSES  No one knows the exact causes of uterine cancer. But it is clear that this disease is not contagious. No one can "catch" cancer from another person. Women who get this disease are more likely than other women to have certain risk factors. A risk factor is something that increases a person's chance of developing the disease.  Most women who have known risk factors do not get uterine cancer. On the other hand, many who do get this disease have none of these factors. Doctors can seldom explain why one woman gets uterine cancer and another does not.  Studies have found the following risk factors:  Age. Cancer of the uterus occurs mostly in women over age 50.  Endometrial hyperplasia (enlarged endometrium). The risk of uterine cancer is  higher if a woman has endometrial hyperplasia.  Hormone replacement therapy (HRT). HRT is used to control the symptoms of menopause, to prevent osteoporosis (thinning of the bones), and to reduce the risk of heart disease or stroke. Women who still have their uterus, and use estrogen without progesterone, have an increased risk of uterine cancer. Long-term use and large doses of estrogen seem to increase this risk. Women who use a combination of estrogen and progesterone have a lower risk of uterine cancer than women who use estrogen alone. The progesterone protects the uterus from developing cancer.  Obesity and related conditions. The body stores and releases some of its estrogen in fatty tissue. That is why obese women are more likely than thin women to have higher levels of estrogen in their bodies. High levels of estrogen may be the reason that obese women have an increased risk of developing uterine cancer. The risk of this disease is also higher in women with diabetes or high blood pressure. These conditions occur in many obese women.  Tamoxifen. Women taking the drug tamoxifen to prevent or treat breast cancer have an increased risk of uterine cancer. This risk appears to be related to the estrogen-like effect of this drug on the uterus.  Race. White women are more likely than African-American women to get uterine cancer.  Colorectal cancer. Women who have had an inherited form of colorectal cancer have a higher risk of developing uterine cancer than other women.  Infertility.  Beginning menstrual   periods before age 12.  Having menstrual periods after age 52.  History of cancer of the ovary or intestine.  Family history of uterine cancer.  Having diabetes, high blood pressure, thyroid or gallbladder disease.  Long-term use of high does of birth control pills. Birth control pills today are low in hormone doses.  Radiation to the abdomen or pelvis.  Smoking. SYMPTOMS  Uterine  cancer usually occurs after menopause. But it may also occur around the time that menopause begins. Abnormal vaginal bleeding is the most common symptom of uterine cancer. Bleeding may start as a watery, blood-streaked flow that gradually contains more blood. Women should not assume that abnormal vaginal bleeding is part of menopause. A woman should see her caregiver if she has any of the following symptoms:  Unusual vaginal bleeding or discharge.  Difficult or painful urination.  Pain during intercourse.  Pain in the pelvic area.  Increased girth (growth) of the stomach.  Any vaginal bleeding after menopause.  Unexplained weight loss. These symptoms can be caused by cancer or other less serious conditions. Most often they are not cancer. But a thorough evaluation is needed to be certain. DIAGNOSIS  If a woman has symptoms that suggest uterine cancer, her caregiver may check her general health and may order blood and urine tests. The caregiver also may perform one or more of these exams or tests.  Blood and urine tests and chest x-rays. The woman also may have:  Other X-rays.  CT scans.  Ultrasound test.  Magnetic resonance imaging (MRI).  Sigmoidoscopy.  Colonoscopy.  Pelvic exam. A woman will have a pelvic exam to check the vagina, uterus, bladder, and rectum. The caregiver feels these organs for any lumps or changes in their shape or size. To see the upper part of the vagina and the cervix, the caregiver inserts an instrument called a speculum into the vagina.  Pap test. The caregiver collects cells from the cervix and upper vagina. A medical laboratory checks for abnormal cells. The Pap test is better for detecting cancer of the cervix. But cells from inside the uterus usually do not show up on a Pap test. It is not a reliable test for uterine cancer.  Transvaginal ultrasound. The medical caregiver inserts an instrument into the vagina. The instrument aims high-frequency  sound waves at the uterus. The pattern of the echoes they produce creates a picture. If the endometrium looks too thick, the caregiver can do a biopsy.  Biopsy. The medical caregiver removes a sample of tissue from the uterine lining. This usually can be done in the caregiver's office.  Dilatation and Curettage (D&C). In some cases, a woman may need to have a D&C. D&C is usually done as same-day surgery with anesthesia in a hospital. A pathologist examines the tissue (lining of the uterus) to check for cancer cells and other conditions. STAGING   If uterine cancer is diagnosed, the caregiver needs to know the stage, or extent, of the disease to plan the best treatment. Staging is a careful attempt to find out whether the cancer has spread, and if so, to what parts of the body.  When uterine cancer spreads (metastasizes) outside the uterus, cancer cells are often found in nearby lymph nodes, nerves, or blood vessels. If the cancer has reached the lymph nodes, cancer cells may have spread to other lymph nodes and other organs of the body.  Staging is done at the time of surgery. In most cases, the most reliable way to stage   this disease is to remove the uterus, cervix, tubes, ovaries, and lymph nodes. A pathologist uses a microscope to examine the uterus and other tissues removed by the surgeon, to determine the extent of the cancer in the pelvis.  If lymph nodes have cancer cells, other parts of the body are examined, to see if it has spread to other organs. MAIN FEATURES OF EACH STAGE OF THE DISEASE: Stage I. The cancer is only in the body of the uterus. It is not in the cervix. Stage II. The cancer has spread from the body of the uterus to the cervix. Stage III. The cancer has spread outside the uterus, but not outside the pelvis (and not to the bladder or rectum). Lymph nodes in the pelvis may contain cancer cells. Stage IV. The cancer has spread into the bladder or rectum. It may have spread  beyond the pelvis to other body parts. TREATMENT  Women with uterine cancer have many treatment options. Most women with uterine cancer are treated with surgery. Some have radiation or chemotherapy. A smaller number of women may be treated with hormonal therapy. Some patients receive a combination of therapies. You may want to consult with another cancer doctor for a second opinion. The caregiver (usually a cancer doctor) is the best person to describe your treatment choices and to discuss the expected results of treatment. SURGERY  Most women with uterine cancer have surgery to remove the uterus, cervix, tubes, and ovaries (total hysterectomy). This is usually done through an incision in the abdomen.  The doctor may also remove the lymph nodes near the tumor, to see if they contain cancer. If cancer cells have reached the lymph nodes, it may mean that the disease has spread to other parts of the body. If cancer cells have not spread beyond the endometrium, the woman may not need to have any other treatment. The length of the hospital stay may vary from several days to a week. RADIATION THERAPY  In radiation therapy, high-energy rays are used to kill cancer cells. Like surgery, radiation therapy is a local therapy. It affects cancer cells only in the treated area.  Some women with Stage I, II, or III uterine cancer need both radiation therapy and surgery. They may have radiation before surgery to shrink the tumor, or after surgery to destroy any cancer cells that remain in the area. The doctor may suggest radiation treatments for the small number of women who cannot have surgery.  Doctors use two types of radiation therapy to treat uterine cancer:  External radiation. In external radiation therapy, a large machine outside the body is used to aim radiation at the tumor area. The woman usually does not stay overnight (outpatient) at the hospital or clinic, and receives external radiation 5 days a week  for several weeks. This schedule helps protect healthy cells and tissue by spreading out the total dose of radiation. No radioactive materials are put into the body for external radiation therapy.  Internal radiation. In internal radiation therapy, tiny tubes containing a radioactive substance are inserted through the vagina and cervix, into the uterus, and left in place for a few days. The woman stays in the hospital during this treatment. To protect others from radiation exposure, the patient may not be able to have visitors or may have visitors only for a short period of time while the implant is in place. Once the implant is removed, the woman has no radioactivity in her body.  Some patients need both   external and internal radiation therapies. CHEMOTHERAPY Chemotherapy is not usually used for endometrial cancer of the uterus. However, with sarcoma of the uterus or of the fibroid, it may be used in combination with surgery. Chemotherapy may also be used with recurring sarcoma, and in patients who cannot have surgery. HORMONE THERAPY Hormonal therapy involves substances that prevent cancer cells from multiplying or growing by attaching to hormone receptors. This causes changes in cancer cells. Before therapy begins, the caregiver may request a hormone receptor test. This special lab test of uterine tissue helps the caregiver learn if estrogen and progesterone receptors are present. If the tissue has receptors, the woman is more likely to respond to hormonal therapy.  Hormonal therapy is called a systemic therapy, because it can affect cancer cells throughout the body. Usually, hormonal therapy is a type of progesterone, taken as a pill or injection.  The doctor may use hormonal therapy for women with uterine cancer who are unable to have surgery or radiation therapy. Also, the doctor may give hormonal therapy to women with uterine cancer that has spread to the lungs or other distant sites. It is also  given to women with uterine cancer that has come back.  Hormonal therapy can cause a number of side effects. Women taking progesterone may retain fluid, have an increased appetite, and gain weight. Women who are still menstruating may have changes in their periods.  Hormone therapy can be used in combination with surgery or radiation. HOME CARE INSTRUCTIONS   Maintain a normal weight with a healthy balanced diet and exercise.  If you have diabetes, high blood pressure, thyroid or gallbladder disease, keep them in control with your caregiver's treatment and recommendations.  Do not smoke.  Do not take estrogen without taking progesterone with it, for menopausal symptoms.  Join a support group or get counseling, if you would like help dealing with your cancer.  If you are on hormone replacement therapy, see your caregiver as recommended, and be informed about the side effects of HRT.  Women with known risk factors should ask their caregiver what symptoms to look for and how often they should have an examination.  Keep your follow-up appointments and take your medicines as advised.  Write your questions down, and take them with you to your caregiver's appointments.  You may want another person to be with you for your appointments, so you do not miss any instructions. SEEK MEDICAL CARE IF:   You have any abnormal vaginal bleeding.  You are having menstrual periods at the age of 52 or older.  You have bleeding after sexual intercourse.  You are taking tomoxifen and develop vaginal bleeding.  Your stomach is growing, and you are not pregnant.  You have pain with sexual intercourse.  You have stomach or pelvis pain.  You have weight loss for no known reason.  You have pain or difficulty with urination. [facility]  Cancer Information Service (CIS) provides accurate, up-to-date information on cancer to patients and their families, health professionals, and  the general public:  Phone: 1-800-4-CANCER (1-800-422-6237).  Internet: http://www.cancer.gov NCI's website contains complete information about cancer causes and prevention, screening and diagnosis, treatment and survivorship, clinical trials, statistics, funding, training, and employment opportunities, and the Institute and its programs. CLINICAL TRIALS A woman who is interested in being part of a clinical trial should talk with her caregiver. NCI's website (http://www.cancer.gov) provides general information about clinical trials. It also offers detailed information about specific ongoing studies of uterine   cancer by linking to PDQ, a cancer information database developed by the NCI. The Cancer Information Service at 1-800-4-CANCER can answer questions about cancer and provide information from the PDQ database. Document Released: 03/24/2005 Document Revised: 06/16/2011 Document Reviewed: 01/25/2009 [employer] Patient Information 2013 [employer].  

## 2012-06-24 NOTE — Progress Notes (Signed)
Consult Note: Gyn-Onc  Herminio Heads 79 y.o. female  CC:  Chief Complaint  Patient presents with  . Endo ca    Follow up    HPI:  [name] is a very pleasant 79 year old who was initially referred to [place] by Dr. Darnelle Bos. At that time, she had an episode of bleeding per rectum, colonoscopy was not feasible secondary to acute diverticulitis, for which she was treated with antibiotics. A CT scan of the abdomen and pelvis in March 2012 revealed a thickening in the fundus of the uterus and she underwent an endometrial biopsy that revealed a grade 2 endometrioid adenocarcinoma. On April 24th, she underwent a total robotic hysterectomy, BSO, bilateral pelvic and para- aortic lymph node dissection. Pathology returned as a grade 2 endometrioid adenocarcinoma with 0.9 out of 1.2 cm myometrial invasion. She had evidence of lymphovascular space involvement. 0 out of 9 right periaortic, 0 out of 2 left periaortic, 0 out of 7 right pelvic nodes were negative, however, 1 out of 5 left pelvic lymph nodes were positive for a total of 1 out of 23 lymph nodes. She was counseled regarding GOG protocol, but declined and she recently completed paclitaxel and carboplatin and radiation in a sandwich fashion (04/10/11). She had a posttreatment CT scan that revealed no evidence of metastatic disease however within the left pelvic region was felt to be a seroma versus lymphocyst had a irregular appearance. She underwent a biopsy and aspiration of this area on April 4. It came back as Klebsiella and she's currently on ciprofloxacin 500 mg twice daily for 30 days cycle. Histology revealed fibrous tissue and necrosis acute and chronic inflammation and abscess formation. There is no evidence of malignancy. She comes in today for followup. She had a negative post treatment CT scan in 5/13.  I last saw her in July 2013. Should a CT scan through Dr. Mariel Sleet on November 2013 that revealed no evidence of recurrent disease. However there  was a soft tissue mass at the gastroesophageal junction. She underwent an EGD in December 2013 with negative biopsy. She comes in today for followup. Her mammogram was negative in January of this year. She is seeing her primary care physician next week to  REVIEW OF SYSTEMS: She states that she really had some low-fatigue issues during her chemotherapy is markedly better now. She does occasionally have some shortness of breath when she walks up her driveway as it is up help to walk down easily. She is also bothered by some bilateral arthritis in her knees. She states that her strength in her hands is somewhat diminished somewhat she recalls being. She was hospitalized for urinary tract infection in November of last year. She's had one urinary tract infection since then was treated as an outpatient. She denies any unintentional weight loss weight gain, headaches, visual changes, chest pain, shortness of breath, fevers, chills. She works in her house and do all the active use of daily living that she likes to do.  However, she denies any neuropathy otherwise. She does complain of some occasional swelling in her feet. She is on Lasix. 10 point sysems review is otherwise negative. She has a followup scheduled with Dr. Mariel Sleet in May.    Current Meds:  Outpatient Encounter Prescriptions as of 06/24/2012  Medication Sig Dispense Refill  . amLODipine (NORVASC) 5 MG tablet Take 5 mg by mouth daily after breakfast.       . Calcium Carbonate (CALCIUM 500 PO) Take 500 mg by mouth 2 (  two) times daily.      . Cholecalciferol (VITAMIN D PO) Take 400 Units by mouth daily.       . Diphenhydramine-APAP, sleep, (TYLENOL PM EXTRA STRENGTH PO) Take 1 tablet by mouth at bedtime as needed. Sleep      . Flaxseed, Linseed, (FLAXSEED OIL) 1200 MG CAPS Take 1,200 mg by mouth daily.        . furosemide (LASIX) 40 MG tablet Take 40 mg by mouth daily with breakfast.       . glipiZIDE (GLUCOTROL) 5 MG tablet Take 5 mg by mouth 2  (two) times daily before a meal.       . Glucosamine 500 MG CAPS Take 500 mg by mouth 2 (two) times daily.        . metFORMIN (GLUCOPHAGE) 500 MG tablet Take 500 mg by mouth 2 (two) times daily. Once daily at bedtime      . metoprolol tartrate (LOPRESSOR) 25 MG tablet Take 25 mg by mouth 2 (two) times daily.       . Omega-3 Fatty Acids (FISH OIL) 1200 MG CAPS Take 1,200 mg by mouth 2 (two) times daily.      Marland Kitchen omeprazole (PRILOSEC) 20 MG capsule 1 po every morning 30 minutes prior to your first meal.  31 capsule  11  . potassium chloride (KLOR-CON) 20 MEQ packet Take 20 mEq by mouth daily.         No facility-administered encounter medications on file as of 06/24/2012.    Allergy:  Allergies  Allergen Reactions  . Statins Other (See Comments)    No energy, fatigue.     Social Hx:   History   Social History  . Marital Status: Widowed    Spouse Name: N/A    Number of Children: 1  . Years of Education: N/A   Occupational History  . Retired    Social History Main Topics  . Smoking status: Never Smoker   . Smokeless tobacco: Never Used  . Alcohol Use: No  . Drug Use: No  . Sexually Active: Not on file   Other Topics Concern  . Not on file   Social History Narrative  . No narrative on file    Past Surgical Hx:  Past Surgical History  Procedure Laterality Date  . Appendectomy  60 yrs ago  . Tonsilectomy, adenoidectomy, bilateral myringotomy and tubes    . Breast mass excision      left breast 1985  . Tumor removal      from throat  benign  . Choleycystectomy  1998  . Abdominal hysterectomy  07/2010    complete, endometrial cancer  . Coronary artery bypass graft  12/21/08    Donata Clay; LIMA LAD, SVG posterior descending, SVG OM, maze procedure, ligation left atrial appendage and MAZE procedure for A.fib  . Cardiac catheterization    . Cataract extraction    . Portacath placement    . Port-a-cath removal  01/23/2012    Procedure: REMOVAL PORT-A-CATH;  Surgeon: Dalia Heading, MD;  Location: [facility];  Service: General;  Laterality: N/A;  Minor Room  . Colonoscopy      several in [place], last one by Dr. Linna Darner, polyps once in [place]. records requested.  . Colonoscopy  06/2010    Dr. Lyn Hollingshead good. Moderate diverticulosis in left colon. Inflammation found in descending colon, scope could not be advanced forward.  ACBE-->sigmoid diverticulosis no worrisome findings.   . Esophagogastroduodenoscopy  03/08/2012    Procedure:  ESOPHAGOGASTRODUODENOSCOPY (EGD);  Surgeon: West Bali, MD;  Location: [facility];  Service: Endoscopy;  Laterality: N/A;  9:30    Past Medical Hx:  Past Medical History  Diagnosis Date  . Hx of CABG     2010, Dr.Van Trigt, Maze procedure, ligation of left atrial appendage, Coumadin stopped January, 2011  . HTN (hypertension)   . Endometrial ca 09/25/2010    June, 2012, radiation and chemotherapy, Dr. Mariel Sleet  . Coronary artery disease   . Hyperlipidemia   . Noninsulin dependent diabetes mellitus   . Osteoarthritis     Severe  . Fluid overload   . Renal insufficiency     Mild with higher dose diuretic  . Ejection fraction     EF 60%, echo, September, 2010  . Atrial fibrillation     Maze procedure September, 2010, amiodarone and Coumadin stopped eventually January, 2011  . Shortness of breath   . Statin intolerance     Is    Family Hx:  Family History  Problem Relation Age of Onset  . Hypertension Mother   . Diabetes Sister     deceased - 53, renal failure,   . Breast cancer Mother   . Colon cancer Neg Hx   . Heart attack Mother     deceased, age 36  . Ovarian cancer Neg Hx   . Lung cancer Neg Hx   . Liver disease Neg Hx     Vitals:  Blood pressure 120/60, pulse 60, temperature 97.7 F (36.5 C), resp. rate 16, height 5' 2.28" (1.582 m), weight 175 lb 6.4 oz (79.561 kg).  Physical Exam:  Well-nourished well-developed female in no acute distress.   Neck: No lymphadenopathy no thyromegaly.  Lungs:  Clear to auscultation bilaterally.   Cardiovascular: Regular rate rhythm.   Abdomen: Soft nontender nondistended there are no palpable masses or hepatosplenomegaly. She has minimal skin changes from radiation.   Groins: No lymphadenopathy.   Extremities: 1-2+ pitting edema slightly worse on left than right.  Pelvic: Normal external female genitalia. The vagina is markedly atrophic. She is a grade 1 cystocele. There is no visible lesions. Pap smear performed.  Bimanual examination reveals no masses or nodularity. Rectal confirms.   Assessment/Plan:  79 year old with stage IIIc 1 endometrial carcinoma who is status post chemotherapy and radiation on a "sandwich" protocol. She completed 3 cycles of chemotherapy with paclitaxel carboplatin followed by pelvic radiation in an additional 3 cycles of chemotherapy which she completed April 10, 2011. She had a negative CT scan in 11/13 for disease that the soft tissue area in the GE junction was consistent with a hiatal hernia biopsies were negative.  She states she has followup with Dr. Mariel Sleet scheduled in May. She will see me 3 months after she sees Dr. Mariel Sleet. We will followup in results for Pap smear.     [name] A., MD 06/24/2012, 12:02 PM

## 2012-06-29 ENCOUNTER — Encounter: Payer: Self-pay | Admitting: Gastroenterology

## 2012-07-09 ENCOUNTER — Telehealth: Payer: Self-pay | Admitting: *Deleted

## 2012-07-09 NOTE — Telephone Encounter (Signed)
Notified patient of Pap results 

## 2012-08-11 ENCOUNTER — Encounter (HOSPITAL_COMMUNITY): Payer: Self-pay | Admitting: Oncology

## 2012-08-11 ENCOUNTER — Encounter (HOSPITAL_COMMUNITY): Payer: Medicare Other | Attending: Oncology | Admitting: Oncology

## 2012-08-11 VITALS — BP 121/69 | HR 49 | Temp 99.0°F | Resp 16 | Wt 178.8 lb

## 2012-08-11 DIAGNOSIS — C779 Secondary and unspecified malignant neoplasm of lymph node, unspecified: Secondary | ICD-10-CM

## 2012-08-11 DIAGNOSIS — C549 Malignant neoplasm of corpus uteri, unspecified: Secondary | ICD-10-CM

## 2012-08-11 DIAGNOSIS — C541 Malignant neoplasm of endometrium: Secondary | ICD-10-CM

## 2012-08-11 NOTE — Progress Notes (Signed)
#  1 adenocarcinoma uterus, grade 2, stage IIIC status post chemotherapy for 3 cycles followed by radiation therapy followed by 3 more cycles of chemotherapy with carboplatin and Taxol in a "sandwich "protocol. She tolerated therapy extremely well. She finished all chemotherapy as of January the third, 2013.  #2 hiatal hernia with some symptoms of GERD recently diagnosed by EGD by Dr. Darrick Penna 03/08/2012  Very pleasant 79 year old lady who is here for followup her for endometrial adenocarcinoma. She is status post total robotic hysterectomy, BSO, bilateral pelvic and  Lymph node dissection with pathology returning a grade 2 endometrioid adenocarcinoma with 0.9 x 1.2 cm myometrial invasion with LV I and 0 of 9 and right periaortic lymph nodes, 0 of 2 left para-aortic lymph nodes, 0 of 7 right pelvic lymph nodes, but one out of 5 left pelvic lymph nodes positive for total of 1/23 lymph nodes. She declined a GOG protocol but was treated with the above-mentioned regimen.  Posttreatment CT scan did not reveal evidence of metastatic disease, that CAT scan was without contrast in November 2013 but I should add that this CAT scan was done prior to her last 3 cycles of chemotherapy.  Her kidney function has now improved and hopefully her next CAT scan can be with contrast.  She looks great and has a negative oncology review of systems. She is slightly weak and tired but blames that on her age and of course to chemotherapy and radiation therapy and surgery.  Vital signs are very stable. Weight is actually up 12 pounds compared to April 2013. Lymph nodes remain negative and the cervical, supraclavicular, infraclavicular, axillary, inguinal areas. Her abdomen is soft, slightly obese, without ascites. She has no hepatosplenomegaly, no tenderness, no masses. Bowel sounds are normal. Lungs are clear. Heart shows a regular rhythm and rate without murmur rub or gallop. She has no thyromegaly. Skin exam is unremarkable. She  has no leg edema. She has no arm edema.  She looks fabulous her blood work will be done in early August just prior to a CT scan her abdomen and pelvis prior to her visit with Dr. Duard Brady in [place]. We will see her tentatively in November.

## 2012-08-11 NOTE — Patient Instructions (Addendum)
[facility] Discharge Instructions  RECOMMENDATIONS MADE BY THE CONSULTANT AND ANY TEST RESULTS WILL BE SENT TO YOUR REFERRING PHYSICIAN.  EXAM FINDINGS BY THE PHYSICIAN TODAY AND SIGNS OR SYMPTOMS TO REPORT TO CLINIC OR PRIMARY PHYSICIAN: Exam and discussion by MD.  Bonita Quin are doing well.  Will check labs and CT scan of your abdomen and pelvis in August.  MEDICATIONS PRESCRIBED:  none  INSTRUCTIONS GIVEN AND DISCUSSED: Report abdominal discomfort or other problems.  SPECIAL INSTRUCTIONS/FOLLOW-UP: Blood work and CT scans of your abdomen and pelvis in August, see Dr. Duard Brady after the scans and to see [place] in November.  Thank you for choosing [facility] to provide your oncology and hematology care.  To afford each patient quality time with our providers, please arrive at least 15 minutes before your scheduled appointment time.  With your help, our goal is to use those 15 minutes to complete the necessary work-up to ensure our physicians have the information they need to help with your evaluation and healthcare recommendations.    Effective January 1st, 2014, we ask that you re-schedule your appointment with our physicians should you arrive 10 or more minutes late for your appointment.  We strive to give you quality time with our providers, and arriving late affects you and other patients whose appointments are after yours.    Again, thank you for choosing [facility].  Our hope is that these requests will decrease the amount of time that you wait before being seen by our physicians.       _____________________________________________________________  Should you have questions after your visit to [facility], please contact our office at [phone] between the hours of 8:30 a.m. and 5:00 p.m.  Voicemails left after 4:30 p.m. will not be returned until the following business day.  For prescription refill requests, have your pharmacy  contact our office with your prescription refill request.

## 2012-09-14 ENCOUNTER — Encounter: Payer: Self-pay | Admitting: Gastroenterology

## 2012-09-15 ENCOUNTER — Encounter: Payer: Self-pay | Admitting: Gastroenterology

## 2012-09-15 ENCOUNTER — Ambulatory Visit (INDEPENDENT_AMBULATORY_CARE_PROVIDER_SITE_OTHER): Payer: Medicare Other | Admitting: Gastroenterology

## 2012-09-15 VITALS — BP 157/68 | HR 62 | Temp 98.2°F | Ht 61.5 in | Wt 176.8 lb

## 2012-09-15 DIAGNOSIS — K229 Disease of esophagus, unspecified: Secondary | ICD-10-CM

## 2012-09-15 DIAGNOSIS — K2940 Chronic atrophic gastritis without bleeding: Secondary | ICD-10-CM | POA: Insufficient documentation

## 2012-09-15 MED ORDER — OMEPRAZOLE 20 MG PO CPDR: DELAYED_RELEASE_CAPSULE | ORAL | Status: DC

## 2012-09-15 NOTE — Assessment & Plan Note (Signed)
DUE TO HIATAL HERNIA. NO ADDITIONAL WORKUP NEEDED AT THIS TIME.

## 2012-09-15 NOTE — Patient Instructions (Signed)
CALL ME IF YOU HAVE PROBLEMS WITH GI UPSET.  Use Prilosec 30 minutes prior to your first meal.  FOLLOW A LOW FAT DIET. SEE INFO BELOW.  FOLLOW UP IN 1 YEAR.    Low-Fat Diet BREADS, CEREALS, PASTA, RICE, DRIED PEAS, AND BEANS These products are high in carbohydrates and most are low in fat. Therefore, they can be increased in the diet as substitutes for fatty foods. They too, however, contain calories and should not be eaten in excess. Cereals can be eaten for snacks as well as for breakfast.   FRUITS AND VEGETABLES It is good to eat fruits and vegetables. Besides being sources of fiber, both are rich in vitamins and some minerals. They help you get the daily allowances of these nutrients. Fruits and vegetables can be used for snacks and desserts.  MEATS Limit lean meat, chicken, [place], and fish to no more than 6 ounces per day. Beef, Pork, and Lamb Use lean cuts of beef, pork, and lamb. Lean cuts include:  Extra-lean ground beef.  Arm roast.  Sirloin tip.  Center-cut ham.  Round steak.  Loin chops.  Rump roast.  Tenderloin.  Trim all fat off the outside of meats before cooking. It is not necessary to severely decrease the intake of red meat, but lean choices should be made. Lean meat is rich in protein and contains a highly absorbable form of iron. Premenopausal women, in particular, should avoid reducing lean red meat because this could increase the risk for low red blood cells (iron-deficiency anemia).  Chicken and [place] These are good sources of protein. The fat of poultry can be reduced by removing the skin and underlying fat layers before cooking. Chicken and [place] can be substituted for lean red meat in the diet. Poultry should not be fried or covered with high-fat sauces. Fish and Shellfish Fish is a good source of protein. Shellfish contain cholesterol, but they usually are low in saturated fatty acids. The preparation of fish is important. Like chicken and [place], they  should not be fried or covered with high-fat sauces. EGGS Egg whites contain no fat or cholesterol. They can be eaten often. Try 1 to 2 egg whites instead of whole eggs in recipes or use egg substitutes that do not contain yolk. MILK AND DAIRY PRODUCTS Use skim or 1% milk instead of 2% or whole milk. Decrease whole milk, natural, and processed cheeses. Use nonfat or low-fat (2%) cottage cheese or low-fat cheeses made from vegetable oils. Choose nonfat or low-fat (1 to 2%) yogurt. Experiment with evaporated skim milk in recipes that call for heavy cream. Substitute low-fat yogurt or low-fat cottage cheese for sour cream in dips and salad dressings. Have at least 2 servings of low-fat dairy products, such as 2 glasses of skim (or 1%) milk each day to help get your daily calcium intake. FATS AND OILS Reduce the total intake of fats, especially saturated fat. Butterfat, lard, and beef fats are high in saturated fat and cholesterol. These should be avoided as much as possible. Vegetable fats do not contain cholesterol, but certain vegetable fats, such as coconut oil, palm oil, and palm kernel oil are very high in saturated fats. These should be limited. These fats are often used in bakery goods, processed foods, popcorn, oils, and nondairy creamers. Vegetable shortenings and some peanut butters contain hydrogenated oils, which are also saturated fats. Read the labels on these foods and check for saturated vegetable oils. Unsaturated vegetable oils and fats do not raise blood  cholesterol. However, they should be limited because they are fats and are high in calories. Total fat should still be limited to 30% of your daily caloric intake. Desirable liquid vegetable oils are corn oil, cottonseed oil, olive oil, canola oil, safflower oil, soybean oil, and sunflower oil. Peanut oil is not as good, but small amounts are acceptable. Buy a heart-healthy tub margarine that has no partially hydrogenated oils in the  ingredients. Mayonnaise and salad dressings often are made from unsaturated fats, but they should also be limited because of their high calorie and fat content. Seeds, nuts, peanut butter, olives, and avocados are high in fat, but the fat is mainly the unsaturated type. These foods should be limited mainly to avoid excess calories and fat. OTHER EATING TIPS Snacks  Most sweets should be limited as snacks. They tend to be rich in calories and fats, and their caloric content outweighs their nutritional value. Some good choices in snacks are graham crackers, melba toast, soda crackers, bagels (no egg), English muffins, fruits, and vegetables. These snacks are preferable to snack crackers, [place] fries, TORTILLA CHIPS, and POTATO chips. Popcorn should be air-popped or cooked in small amounts of liquid vegetable oil. Desserts Eat fruit, low-fat yogurt, and fruit ices instead of pastries, cake, and cookies. Sherbet, angel food cake, gelatin dessert, frozen low-fat yogurt, or other frozen products that do not contain saturated fat (pure fruit juice bars, frozen ice pops) are also acceptable.  COOKING METHODS Choose those methods that use little or no fat. They include: Poaching.  Braising.  Steaming.  Grilling.  Baking.  Stir-frying.  Broiling.  Microwaving.  Foods can be cooked in a nonstick pan without added fat, or use a nonfat cooking spray in regular cookware. Limit fried foods and avoid frying in saturated fat. Add moisture to lean meats by using water, broth, cooking wines, and other nonfat or low-fat sauces along with the cooking methods mentioned above. Soups and stews should be chilled after cooking. The fat that forms on top after a few hours in the refrigerator should be skimmed off. When preparing meals, avoid using excess salt. Salt can contribute to raising blood pressure in some people.  EATING AWAY FROM HOME Order entres, potatoes, and vegetables without sauces or butter. When meat  exceeds the size of a deck of cards (3 to 4 ounces), the rest can be taken home for another meal. Choose vegetable or fruit salads and ask for low-calorie salad dressings to be served on the side. Use dressings sparingly. Limit high-fat toppings, such as bacon, crumbled eggs, cheese, sunflower seeds, and olives. Ask for heart-healthy tub margarine instead of butter.

## 2012-09-15 NOTE — Progress Notes (Signed)
Subjective:    Patient ID: [name], female    DOB: 1931-01-16, 79 y.o.   MRN: [id number]  [name],[name], MD  HPI HAS A PROBLEM WITH COUGH. WONDERING IF ITS RELATED TO HER HIATAL HERNIA. BmS: DAILY.  PT DENIES FEVER, CHILLS, BRBPR, nausea, vomiting, melena, diarrhea, constipation, abd pain, problems swallowing, problems with sedation, heartburn or indigestion.   Past Medical History  Diagnosis Date  . Hx of CABG     2010, Dr.Van Trigt, Maze procedure, ligation of left atrial appendage, Coumadin stopped January, 2011  . HTN (hypertension)   . Endometrial ca 09/25/2010    June, 2012, radiation and chemotherapy, Dr. Mariel Sleet  . Coronary artery disease   . Hyperlipidemia   . Noninsulin dependent diabetes mellitus   . Osteoarthritis     Severe  . Fluid overload   . Renal insufficiency     Mild with higher dose diuretic  . Ejection fraction     EF 60%, echo, September, 2010  . Atrial fibrillation     Maze procedure September, 2010, amiodarone and Coumadin stopped eventually January, 2011  . Shortness of breath   . Statin intolerance     Is  . Hiatal hernia     Past Surgical History  Procedure Laterality Date  . Appendectomy  60 yrs ago  . Tonsilectomy, adenoidectomy, bilateral myringotomy and tubes    . Breast mass excision      left breast 1985  . Tumor removal      from throat  benign  . Choleycystectomy  1998  . Abdominal hysterectomy  07/2010    complete, endometrial cancer  . Coronary artery bypass graft  12/21/08    Donata Clay; LIMA LAD, SVG posterior descending, SVG OM, maze procedure, ligation left atrial appendage and MAZE procedure for A.fib  . Cardiac catheterization    . Cataract extraction    . Portacath placement    . Port-a-cath removal  01/23/2012    Procedure: REMOVAL PORT-A-CATH;  Surgeon: Dalia Heading, MD;  Location: [facility];  Service: General;  Laterality: N/A;  Minor Room  . Colonoscopy      several in [place], last one by Dr. Linna Darner,  polyps once in [place]. records requested.  . Colonoscopy  06/2010    Dr. Lyn Hollingshead good. Moderate diverticulosis in left colon. Inflammation found in descending colon, scope could not be advanced forward.  ACBE-->sigmoid diverticulosis no worrisome findings.   . Esophagogastroduodenoscopy  03/08/2012    WUJ:[id number] ring was found at the gastroesophageal junction/Nodular gastritis (inflammation)/duodenal mucosa showed no abnormalities    Allergies  Allergen Reactions  . Statins Other (See Comments)    No energy, fatigue.    Current Outpatient Prescriptions  Medication Sig Dispense Refill  . amLODipine (NORVASC) 5 MG tablet Take 5 mg by mouth daily after breakfast.       . benazepril (LOTENSIN) 10 MG tablet Take 10 mg by mouth daily.      . Calcium Carbonate (CALCIUM 500 PO) Take 500 mg by mouth 2 (two) times daily.      . Cholecalciferol (VITAMIN D PO) Take 400 Units by mouth daily.       . Diphenhydramine-APAP, sleep, (TYLENOL PM EXTRA STRENGTH PO) Take 1 tablet by mouth at bedtime as needed. Sleep      . Flaxseed, Linseed, (FLAXSEED OIL) 1200 MG CAPS Take 1,200 mg by mouth daily.        . furosemide (LASIX) 40 MG tablet Take 40 mg by mouth daily with  breakfast.       . glipiZIDE (GLUCOTROL) 5 MG tablet Take 5 mg by mouth 2 (two) times daily before a meal.       . Glucosamine 500 MG CAPS Take 500 mg by mouth 2 (two) times daily.        . metFORMIN (GLUCOPHAGE) 500 MG tablet Take 500 mg by mouth 2 (two) times daily. Once daily at bedtime      . metoprolol tartrate (LOPRESSOR) 25 MG tablet Take 25 mg by mouth 2 (two) times daily.       . Omega-3 Fatty Acids (FISH OIL) 1200 MG CAPS Take 1,200 mg by mouth 2 (two) times daily.      Marland Kitchen omeprazole (PRILOSEC) 20 MG capsule 1 po every morning 30 minutes prior to your first meal.    . potassium chloride SA (K-DUR,KLOR-CON) 20 MEQ tablet Take 20 mEq by mouth daily.        Review of Systems     Objective:   Physical Exam         Assessment & Plan:

## 2012-09-15 NOTE — Progress Notes (Signed)
Cc PCP 

## 2012-09-15 NOTE — Assessment & Plan Note (Signed)
NO S/SX OF DYSPEPSIA.  MONITOR FOR RECURRENT SYMPTOMS. CONSIDER REPEAT EGD IN 3-5 YEARS CONTINUE PRILOSEC FOR NOW. EXPLAINED TO PT THAT IF SHE HAS TROUBLE WITH LOW HB/IRONSTORES, THEN WILL D/C PRILOSEC BECAUSE SHE NEEDS ACID TO ABSORB ORAL IRON. OPV IN 1 YEAR. CBC QYEAR.

## 2012-09-27 NOTE — Progress Notes (Signed)
Reminders in epic °

## 2012-11-08 ENCOUNTER — Encounter (HOSPITAL_COMMUNITY): Payer: Medicare Other | Attending: Oncology

## 2012-11-08 DIAGNOSIS — C549 Malignant neoplasm of corpus uteri, unspecified: Secondary | ICD-10-CM | POA: Insufficient documentation

## 2012-11-08 DIAGNOSIS — C541 Malignant neoplasm of endometrium: Secondary | ICD-10-CM

## 2012-11-08 LAB — COMPREHENSIVE METABOLIC PANEL
BUN: 38 mg/dL — ABNORMAL HIGH (ref 6–23)
CO2: 25 mEq/L (ref 19–32)
Calcium: 9.4 mg/dL (ref 8.4–10.5)
Chloride: 101 mEq/L (ref 96–112)
Creatinine, Ser: 1.31 mg/dL — ABNORMAL HIGH (ref 0.50–1.10)
GFR calc Af Amer: 44 mL/min — ABNORMAL LOW (ref >90)
GFR calc non Af Amer: 38 mL/min — ABNORMAL LOW (ref >90)
Glucose, Bld: 146 mg/dL — ABNORMAL HIGH (ref 70–99)
Total Bilirubin: 0.3 mg/dL (ref 0.3–1.2)

## 2012-11-08 LAB — CBC WITH DIFFERENTIAL/PLATELET
Basophils Absolute: 0 10*3/uL (ref 0.0–0.1)
Eosinophils Relative: 6 % — ABNORMAL HIGH (ref 0–5)
HCT: 32.2 % — ABNORMAL LOW (ref 36.0–46.0)
Hemoglobin: 11.1 g/dL — ABNORMAL LOW (ref 12.0–15.0)
Lymphocytes Relative: 10 % — ABNORMAL LOW (ref 12–46)
Lymphs Abs: 0.5 10*3/uL — ABNORMAL LOW (ref 0.7–4.0)
MCV: 87.7 fL (ref 78.0–100.0)
Monocytes Absolute: 0.4 10*3/uL (ref 0.1–1.0)
Monocytes Relative: 7 % (ref 3–12)
Neutro Abs: 4 10*3/uL (ref 1.7–7.7)
RDW: 12.7 % (ref 11.5–15.5)
WBC: 5.2 10*3/uL (ref 4.0–10.5)

## 2012-11-08 NOTE — Progress Notes (Signed)
Labs drawn today for cbc/diff,cmp 

## 2012-11-11 ENCOUNTER — Ambulatory Visit (HOSPITAL_COMMUNITY)
Admission: RE | Admit: 2012-11-11 | Discharge: 2012-11-11 | Disposition: A | Discharge status: Home / Self Care | Payer: Medicare Other | Source: Ambulatory Visit | Attending: Oncology | Admitting: Oncology

## 2012-11-11 DIAGNOSIS — C549 Malignant neoplasm of corpus uteri, unspecified: Secondary | ICD-10-CM | POA: Insufficient documentation

## 2012-11-11 DIAGNOSIS — K7689 Other specified diseases of liver: Secondary | ICD-10-CM | POA: Insufficient documentation

## 2012-11-11 DIAGNOSIS — Z9221 Personal history of antineoplastic chemotherapy: Secondary | ICD-10-CM | POA: Insufficient documentation

## 2012-11-11 DIAGNOSIS — C541 Malignant neoplasm of endometrium: Secondary | ICD-10-CM

## 2012-11-11 DIAGNOSIS — Z923 Personal history of irradiation: Secondary | ICD-10-CM | POA: Insufficient documentation

## 2012-11-11 MED ORDER — IOHEXOL 300 MG/ML  SOLN
100.0000 mL | Freq: Once | INTRAMUSCULAR | Status: AC | PRN
  Administered 2012-11-11: 80 mL via INTRAVENOUS

## 2012-11-30 ENCOUNTER — Encounter (HOSPITAL_COMMUNITY): Payer: Self-pay | Admitting: Anesthesiology

## 2012-12-30 ENCOUNTER — Encounter: Payer: Self-pay | Admitting: Cardiology

## 2012-12-30 ENCOUNTER — Ambulatory Visit: Payer: Medicare Other | Attending: Gynecologic Oncology | Admitting: Gynecologic Oncology

## 2013-01-02 ENCOUNTER — Encounter: Payer: Self-pay | Admitting: Cardiology

## 2013-01-04 ENCOUNTER — Encounter: Payer: Self-pay | Admitting: Cardiology

## 2013-01-06 ENCOUNTER — Encounter: Payer: Self-pay | Admitting: Cardiology

## 2013-02-11 ENCOUNTER — Ambulatory Visit (HOSPITAL_COMMUNITY): Payer: Medicare Other

## 2013-02-11 ENCOUNTER — Encounter (HOSPITAL_COMMUNITY): Payer: Self-pay

## 2013-02-22 NOTE — Progress Notes (Signed)
Erroneous encounter This encounter was created in error - please disregard. 

## 2013-03-22 ENCOUNTER — Encounter: Payer: Self-pay | Admitting: Cardiology

## 2013-03-22 ENCOUNTER — Ambulatory Visit (INDEPENDENT_AMBULATORY_CARE_PROVIDER_SITE_OTHER): Payer: Medicare Other | Admitting: Cardiology

## 2013-03-22 VITALS — BP 90/60 | HR 85 | Ht 61.0 in | Wt 149.8 lb

## 2013-03-22 DIAGNOSIS — R9430 Abnormal result of cardiovascular function study, unspecified: Secondary | ICD-10-CM

## 2013-03-22 DIAGNOSIS — Z7901 Long term (current) use of anticoagulants: Secondary | ICD-10-CM

## 2013-03-22 DIAGNOSIS — I2789 Other specified pulmonary heart diseases: Secondary | ICD-10-CM

## 2013-03-22 DIAGNOSIS — IMO0002 Reserved for concepts with insufficient information to code with codable children: Secondary | ICD-10-CM

## 2013-03-22 DIAGNOSIS — R0989 Other specified symptoms and signs involving the circulatory and respiratory systems: Secondary | ICD-10-CM

## 2013-03-22 DIAGNOSIS — Z951 Presence of aortocoronary bypass graft: Secondary | ICD-10-CM

## 2013-03-22 DIAGNOSIS — I272 Pulmonary hypertension, unspecified: Secondary | ICD-10-CM | POA: Insufficient documentation

## 2013-03-22 DIAGNOSIS — I4891 Unspecified atrial fibrillation: Secondary | ICD-10-CM

## 2013-03-22 DIAGNOSIS — I2510 Atherosclerotic heart disease of native coronary artery without angina pectoris: Secondary | ICD-10-CM

## 2013-03-22 DIAGNOSIS — E785 Hyperlipidemia, unspecified: Secondary | ICD-10-CM

## 2013-03-22 DIAGNOSIS — I059 Rheumatic mitral valve disease, unspecified: Secondary | ICD-10-CM

## 2013-03-22 DIAGNOSIS — I700 Atherosclerosis of aorta: Secondary | ICD-10-CM

## 2013-03-22 DIAGNOSIS — I340 Nonrheumatic mitral (valve) insufficiency: Secondary | ICD-10-CM

## 2013-03-22 DIAGNOSIS — I350 Nonrheumatic aortic (valve) stenosis: Secondary | ICD-10-CM | POA: Insufficient documentation

## 2013-03-22 DIAGNOSIS — I10 Essential (primary) hypertension: Secondary | ICD-10-CM

## 2013-03-22 DIAGNOSIS — IMO0001 Reserved for inherently not codable concepts without codable children: Secondary | ICD-10-CM | POA: Insufficient documentation

## 2013-03-22 NOTE — Assessment & Plan Note (Signed)
The patient had return of atrial fibrillation. She is on medicines for rate control at this time. She is also on Coumadin. Her left atrial appendage was removed at the time of her bypass surgery. Therefore, if she has any difficulties with the question of ongoing anticoagulation, it will be easier to stop her anticoagulation.

## 2013-03-22 NOTE — Patient Instructions (Signed)

## 2013-03-22 NOTE — Assessment & Plan Note (Signed)
Unfortunately she is statin intolerant. No change in therapy.

## 2013-03-22 NOTE — Assessment & Plan Note (Signed)
She has valvular heart disease by echo. However this is stable. No change in therapy at this time.  As part of today's evaluation I spent greater than 25 minutes with her total care. I have reviewed her records extensively and updated this record. More than half of 25 minutes was spent with direct contact with her reviewing all of her problems.

## 2013-03-22 NOTE — Assessment & Plan Note (Signed)
Her blood pressure currently is on the low side. However she is clinically stable. It is important to continue her diltiazem and beta blocker.

## 2013-03-22 NOTE — Progress Notes (Signed)
HPI  Patient is seen today to followup coronary disease and atrial fibrillation. I saw her last November, 2013. She underwent bypass surgery in 2010. She had a Maze procedure for her atrial fibrillation area initially she was treated with amiodarone and Coumadin. She returned to sinus rhythm and her amiodarone and Coumadin were stopped. During this past year however she was hospitalized in September, 2014. She had several hospitalizations. Initially she had pelvic fracture. She was then admitted on one occasion with rapid atrial fibrillation. At that time her rate was controlled with medications. It is my understanding that Xarelto was used for a period of time in the hospital. This was then changed to Coumadin and she is on Coumadin followed by her primary physician at this time. She has continued atrial fibrillation. Clinically her rate is controlled.  As part of today's evaluation I have reviewed carefully her hospital records and the records from the nursing home. I have updated this electronic medical record concerning her most recent events.  She has not had any significant chest pain.  Allergies  Allergen Reactions  . Statins Other (See Comments)    No energy, fatigue.     Current Outpatient Prescriptions  Medication Sig Dispense Refill  . Calcium Carbonate (CALCIUM 500 PO) Take 500 mg by mouth 2 (two) times daily.      . Cholecalciferol (VITAMIN D PO) Take 400 Units by mouth daily.       Marland Kitchen diltiazem (CARDIZEM CD) 240 MG 24 hr capsule Take 240 mg by mouth daily.      . Diphenhydramine-APAP, sleep, (TYLENOL PM EXTRA STRENGTH PO) Take 1 tablet by mouth at bedtime as needed. Sleep      . furosemide (LASIX) 40 MG tablet Take 20 mg by mouth daily with breakfast.       . glipiZIDE (GLUCOTROL) 5 MG tablet Take 5 mg by mouth daily.       . Glucosamine 500 MG CAPS Take 500 mg by mouth 2 (two) times daily.        Marland Kitchen HYDROcodone-acetaminophen (NORCO/VICODIN) 5-325 MG per tablet Take 1 tablet  by mouth every 8 (eight) hours as needed for moderate pain.      . Lido-Capsaicin-Men-Methyl Sal (MEDI-PATCH-LIDOCAINE) 0.5-0.035-5-20 % PTCH Apply 1 patch topically daily.      . magnesium oxide (MAG-OX) 400 MG tablet Take 400 mg by mouth daily.      . metFORMIN (GLUCOPHAGE) 500 MG tablet Take 500 mg by mouth at bedtime. Once daily at bedtime      . metoprolol tartrate (LOPRESSOR) 25 MG tablet Take 50 mg by mouth 2 (two) times daily.       . Multiple Vitamin (MULTIVITAMIN) tablet Take 1 tablet by mouth daily.      . Omega-3 Fatty Acids (FISH OIL) 1200 MG CAPS Take 1,200 mg by mouth 2 (two) times daily.      Marland Kitchen omeprazole (PRILOSEC) 20 MG capsule 1 po every morning 30 minutes prior to your first meal.  31 capsule  11  . potassium chloride SA (K-DUR,KLOR-CON) 20 MEQ tablet Take 20 mEq by mouth daily.      . sodium bicarbonate 650 MG tablet Take 650 mg by mouth 3 (three) times daily.      Marland Kitchen warfarin (COUMADIN) 5 MG tablet Take 5 mg by mouth daily. MANAGED BY PMD       No current facility-administered medications for this visit.    History   Social History  . Marital Status: Widowed  Spouse Name: N/A    Number of Children: 1  . Years of Education: N/A   Occupational History  . Retired    Social History Main Topics  . Smoking status: Never Smoker   . Smokeless tobacco: Never Used  . Alcohol Use: No  . Drug Use: No  . Sexual Activity: Not on file   Other Topics Concern  . Not on file   Social History Narrative  . No narrative on file    Family History  Problem Relation Age of Onset  . Hypertension Mother   . Diabetes Sister     deceased - 30, renal failure,   . Breast cancer Mother   . Colon cancer Neg Hx   . Heart attack Mother     deceased, age 9  . Ovarian cancer Neg Hx   . Lung cancer Neg Hx   . Liver disease Neg Hx     Past Medical History  Diagnosis Date  . Hx of CABG     2010, Dr.Van Trigt, Maze procedure, ligation of left atrial appendage, Coumadin  stopped January, 2011  . HTN (hypertension)   . Endometrial ca 09/25/2010    June, 2012, radiation and chemotherapy, Dr. Mariel Sleet  . Coronary artery disease   . Hyperlipidemia   . Noninsulin dependent diabetes mellitus   . Osteoarthritis     Severe  . Fluid overload   . Renal insufficiency     Mild with higher dose diuretic  . Ejection fraction     EF 60%, echo, September, 2010  . Atrial fibrillation     Maze procedure September, 2010, amiodarone and Coumadin stopped eventually January, 2011  . Shortness of breath   . Statin intolerance     Is  . Hiatal hernia     Past Surgical History  Procedure Laterality Date  . Appendectomy  60 yrs ago  . Tonsilectomy, adenoidectomy, bilateral myringotomy and tubes    . Breast mass excision      left breast 1985  . Tumor removal      from throat  benign  . Choleycystectomy  1998  . Abdominal hysterectomy  07/2010    complete, endometrial cancer  . Coronary artery bypass graft  12/21/08    Donata Clay; LIMA LAD, SVG posterior descending, SVG OM, maze procedure, ligation left atrial appendage and MAZE procedure for A.fib  . Cardiac catheterization    . Cataract extraction    . Portacath placement    . Port-a-cath removal  01/23/2012    Procedure: REMOVAL PORT-A-CATH;  Surgeon: Dalia Heading, MD;  Location: [facility];  Service: General;  Laterality: N/A;  Minor Room  . Colonoscopy      several in [place], last one by Dr. Linna Darner, polyps once in [place]. records requested.  . Colonoscopy  06/2010    Dr. Lyn Hollingshead good. Moderate diverticulosis in left colon. Inflammation found in descending colon, scope could not be advanced forward.  ACBE-->sigmoid diverticulosis no worrisome findings.   . Esophagogastroduodenoscopy  03/08/2012    [id number] ring was found at the gastroesophageal junction/Nodular gastritis (inflammation)/duodenal mucosa showed no abnormalities     Patient Active Problem List   Diagnosis Date Noted  . Chronic  anticoagulation 03/22/2013  . Mitral regurgitation 03/22/2013  . Aortic sclerosis 03/22/2013  . Pulmonary hypertension 03/22/2013  . Gastritis, atrophic 09/15/2012  . Hiatal hernia 02/24/2012  . Abnormality of esophagus 02/24/2012  . Anemia 02/24/2012  . Statin intolerance   . Hx of CABG   .  HTN (hypertension)   . Coronary artery disease   . Hyperlipidemia   . Fluid overload   . Renal insufficiency   . Ejection fraction   . Atrial fibrillation   . Endometrial ca 09/25/2010  . SHORTNESS OF BREATH 03/27/2009  . DM 01/23/2009  . HYPERLIPIDEMIA 01/23/2009  . HYPERTENSION 01/23/2009    ROS   Patient denies fever, chills, headache, sweats, rash, change in vision, change in hearing, chest pain, cough, nausea vomiting, urinary symptoms. She has continued problems with discomfort from her pelvis fracture. She is slowly recovering.  PHYSICAL EXAM  The patient does walk with a rolling walker. She is oriented to person time and place. Affect is normal. She is here with a friend. There is no jugulovenous distention. Lungs are clear. Respiratory effort is nonlabored. Cardiac exam reveals the rhythm that is irregularly irregular. The rate is controlled. Her abdomen is soft. There is no peripheral edema. There no musculoskeletal deformities. There are no skin rashes.  Filed Vitals:   03/22/13 0909  BP: 90/60  Pulse: 85  Height: 5\' 1"  (1.549 m)  Weight: 149 lb 12.8 oz (67.949 kg)  SpO2: 99%   I reviewed the EKGs from her hospitalization in September, 2014. She did have atrial fibrillation. There was no significant change in the QRS.  ASSESSMENT & PLAN

## 2013-03-22 NOTE — Assessment & Plan Note (Signed)
I have noted above that the patient's atrial appendage was ligated at the time of her bypass surgery. It is appropriate to use anticoagulation at this time. However if she has any difficulties with bleeding, it will be easier to stop her anticoagulation.

## 2013-03-22 NOTE — Assessment & Plan Note (Signed)
Coronary disease is stable. No change in therapy. 

## 2013-09-28 ENCOUNTER — Encounter: Payer: Self-pay | Admitting: Gastroenterology

## 2013-10-10 ENCOUNTER — Encounter: Payer: Self-pay | Admitting: Cardiology

## 2013-10-10 ENCOUNTER — Ambulatory Visit (INDEPENDENT_AMBULATORY_CARE_PROVIDER_SITE_OTHER): Payer: Medicare Other | Admitting: Cardiology

## 2013-10-10 VITALS — BP 125/59 | HR 85 | Ht 61.0 in | Wt 163.0 lb

## 2013-10-10 DIAGNOSIS — I4891 Unspecified atrial fibrillation: Secondary | ICD-10-CM

## 2013-10-10 DIAGNOSIS — Z7901 Long term (current) use of anticoagulants: Secondary | ICD-10-CM

## 2013-10-10 DIAGNOSIS — I482 Chronic atrial fibrillation, unspecified: Secondary | ICD-10-CM

## 2013-10-10 NOTE — Assessment & Plan Note (Signed)
Atrial fibrillation rate is controlled. She had a Maze procedure at the time of her bypass surgery. Amiodarone was used but eventually she went back to atrial fibrillation. Coumadin is used. Her left atrial appendages was tied off at the time of her surgery.

## 2013-10-10 NOTE — Patient Instructions (Signed)

## 2013-10-10 NOTE — Progress Notes (Signed)
Patient ID: [name], female   DOB: April 09, 1930, 80 y.o.   MRN: [id number]     HPI  The patient is stable. I saw her last in the office December, 2014. She has chronic atrial fibrillation that has been stable. She is statin intolerant. She has coronary disease and she's not having any significant chest pain. Overall she's doing well.  Allergies  Allergen Reactions  . Statins Other (See Comments)    No energy, fatigue.     Current Outpatient Prescriptions  Medication Sig Dispense Refill  . Calcium Carbonate (CALCIUM 500 PO) Take 500 mg by mouth 2 (two) times daily.      . Cholecalciferol (VITAMIN D PO) Take 400 Units by mouth daily.       Marland Kitchen diltiazem (CARDIZEM CD) 240 MG 24 hr capsule Take 240 mg by mouth daily.      . Diphenhydramine-APAP, sleep, (TYLENOL PM EXTRA STRENGTH PO) Take 1 tablet by mouth at bedtime as needed. Sleep      . furosemide (LASIX) 40 MG tablet Take 20 mg by mouth daily with breakfast.       . glipiZIDE (GLUCOTROL) 5 MG tablet Take 5 mg by mouth daily.       . Glucosamine 500 MG CAPS Take 500 mg by mouth 2 (two) times daily.        Marland Kitchen HYDROcodone-acetaminophen (NORCO/VICODIN) 5-325 MG per tablet Take 1 tablet by mouth every 8 (eight) hours as needed for moderate pain.      . magnesium oxide (MAG-OX) 400 MG tablet Take 400 mg by mouth daily.      . metFORMIN (GLUCOPHAGE) 500 MG tablet Take 500 mg by mouth at bedtime. Once daily at bedtime      . metoprolol tartrate (LOPRESSOR) 25 MG tablet Take 50 mg by mouth 2 (two) times daily.       . Multiple Vitamin (MULTIVITAMIN) tablet Take 1 tablet by mouth daily.      . Omega-3 Fatty Acids (FISH OIL) 1200 MG CAPS Take 1,200 mg by mouth 2 (two) times daily.      Marland Kitchen omeprazole (PRILOSEC) 20 MG capsule 1 po every morning 30 minutes prior to your first meal.  31 capsule  11  . potassium chloride SA (K-DUR,KLOR-CON) 20 MEQ tablet Take 20 mEq by mouth daily.      . sodium bicarbonate 650 MG tablet Take 650 mg by mouth 3  (three) times daily.      Marland Kitchen warfarin (COUMADIN) 5 MG tablet Take 5 mg by mouth daily. MANAGED BY PMD       No current facility-administered medications for this visit.    History   Social History  . Marital Status: Widowed    Spouse Name: N/A    Number of Children: 1  . Years of Education: N/A   Occupational History  . Retired    Social History Main Topics  . Smoking status: Never Smoker   . Smokeless tobacco: Never Used  . Alcohol Use: No  . Drug Use: No  . Sexual Activity: Not on file   Other Topics Concern  . Not on file   Social History Narrative  . No narrative on file    Family History  Problem Relation Age of Onset  . Hypertension Mother   . Diabetes Sister     deceased - 36, renal failure,   . Breast cancer Mother   . Colon cancer Neg Hx   . Heart attack Mother  deceased, age 60  . Ovarian cancer Neg Hx   . Lung cancer Neg Hx   . Liver disease Neg Hx     Past Medical History  Diagnosis Date  . Hx of CABG     2010, Dr.Van Trigt, Maze procedure, ligation of left atrial appendage, Coumadin stopped January, 2011  . HTN (hypertension)   . Endometrial ca 09/25/2010    June, 2012, radiation and chemotherapy, Dr. Tressie Stalker  . Coronary artery disease   . Hyperlipidemia   . Noninsulin dependent diabetes mellitus   . Osteoarthritis     Severe  . Fluid overload   . Renal insufficiency     Mild with higher dose diuretic  . Ejection fraction     EF 60%, echo, September, 2010  . Atrial fibrillation     Maze procedure September, 2010, amiodarone and Coumadin stopped eventually January, 2011  . Shortness of breath   . Statin intolerance     Is  . Hiatal hernia     Past Surgical History  Procedure Laterality Date  . Appendectomy  60 yrs ago  . Tonsilectomy, adenoidectomy, bilateral myringotomy and tubes    . Breast mass excision      left breast 1985  . Tumor removal      from throat  benign  . Choleycystectomy  1998  . Abdominal hysterectomy   07/2010    complete, endometrial cancer  . Coronary artery bypass graft  12/21/08    Pres[place] Gum; LIMA LAD, SVG posterior descending, SVG OM, maze procedure, ligation left atrial appendage and MAZE procedure for A.fib  . Cardiac catheterization    . Cataract extraction    . Portacath placement    . Port-a-cath removal  01/23/2012    Procedure: REMOVAL PORT-A-CATH;  Surgeon: Jamesetta So, MD;  Location: [facility];  Service: General;  Laterality: N/A;  Minor Room  . Colonoscopy      several in [place], last one by Dr. Rowe Pavy, polyps once in [place]. records requested.  . Colonoscopy  06/2010    Dr. Rodrigo Ran good. Moderate diverticulosis in left colon. Inflammation found in descending colon, scope could not be advanced forward.  ACBE-->sigmoid diverticulosis no worrisome findings.   . Esophagogastroduodenoscopy  03/08/2012    [id number] ring was found at the gastroesophageal junction/Nodular gastritis (inflammation)/duodenal mucosa showed no abnormalities     Patient Active Problem List   Diagnosis Date Noted  . Chronic anticoagulation 03/22/2013  . Mitral regurgitation 03/22/2013  . Aortic sclerosis 03/22/2013  . Pulmonary hypertension 03/22/2013  . Gastritis, atrophic 09/15/2012  . Hiatal hernia 02/24/2012  . Abnormality of esophagus 02/24/2012  . Anemia 02/24/2012  . Statin intolerance   . Hx of CABG   . Coronary artery disease   . Fluid overload   . Renal insufficiency   . Ejection fraction   . Atrial fibrillation   . Endometrial ca 09/25/2010  . SHORTNESS OF BREATH 03/27/2009  . DM 01/23/2009  . HYPERLIPIDEMIA 01/23/2009  . HYPERTENSION 01/23/2009    ROS   Pacing denies fever, chills, headache, sweats, rash, change in vision, change in hearing, chest pain, cough, nausea or vomiting, urinary symptoms. All other systems are reviewed and are negative.  PHYSICAL EXAM  Patient's oriented to person time and place. Affect is normal. She walks with a sliding walker. Head is  atraumatic. Sclera and conjunctiva are normal. There is no jugulovenous distention. Lungs are clear. Respiratory effort is nonlabored. Cardiac exam reveals S1 and S2. The rhythm is irregularly irregular.  The abdomen is soft. There is no peripheral edema.  Filed Vitals:   10/10/13 0918  BP: 125/59  Pulse: 85  Height: 5\' 1"  (1.549 m)  Weight: 163 lb (73.936 kg)   EKG is done today and reviewed by me. There is atrial fibrillation. The rate is controlled. There is no significant change.  ASSESSMENT & PLAN

## 2013-10-10 NOTE — Assessment & Plan Note (Signed)
She is anticoagulated. This is followed by her local physician. If she has any difficulties with anticoagulation it can be stopped because her left atrial appendage was tied off at surgery.

## 2014-03-25 ENCOUNTER — Other Ambulatory Visit: Payer: Self-pay | Admitting: Nurse Practitioner

## 2014-04-12 ENCOUNTER — Ambulatory Visit: Payer: Medicare Other | Admitting: Cardiology

## 2014-08-18 ENCOUNTER — Ambulatory Visit (INDEPENDENT_AMBULATORY_CARE_PROVIDER_SITE_OTHER): Payer: Medicare Other | Admitting: Cardiology

## 2014-08-18 ENCOUNTER — Encounter: Payer: Self-pay | Admitting: Cardiology

## 2014-08-18 VITALS — BP 131/79 | HR 99 | Ht 61.0 in | Wt 157.8 lb

## 2014-08-18 DIAGNOSIS — I2510 Atherosclerotic heart disease of native coronary artery without angina pectoris: Secondary | ICD-10-CM

## 2014-08-18 DIAGNOSIS — I700 Atherosclerosis of aorta: Secondary | ICD-10-CM | POA: Diagnosis not present

## 2014-08-18 DIAGNOSIS — IMO0001 Reserved for inherently not codable concepts without codable children: Secondary | ICD-10-CM

## 2014-08-18 DIAGNOSIS — Z7901 Long term (current) use of anticoagulants: Secondary | ICD-10-CM | POA: Diagnosis not present

## 2014-08-18 DIAGNOSIS — I482 Chronic atrial fibrillation, unspecified: Secondary | ICD-10-CM

## 2014-08-18 NOTE — Assessment & Plan Note (Signed)
Patient is taking Coumadin and this is followed by her primary physician.

## 2014-08-18 NOTE — Assessment & Plan Note (Signed)
Patient has a history of coronary disease. She stable. No further workup. She is statin intolerant.

## 2014-08-18 NOTE — Progress Notes (Signed)
Cardiology Office Note   Date:  08/18/2014   ID:  [name], [name] 04/04/1931, MRN [id number]  PCP:  Neale Burly, MD  Cardiologist:  Dola Argyle, MD   Chief Complaint  Patient presents with  . Appointment    Follow-up atrial fibrillation      History of Present Illness: [name] is a 81 y.o. female who presents today to follow up atrial fibrillation. From the cardiac viewpoint she is quite stable. She is statin intolerant. She is not having any problems from the coronary viewpoint. She has had difficulties with bone fractures over time. She is currently recovering and is walking with a walker.    Past Medical History  Diagnosis Date  . Hx of CABG     2010, Dr.Van Trigt, Maze procedure, ligation of left atrial appendage, Coumadin stopped January, 2011  . HTN (hypertension)   . Endometrial ca 09/25/2010    June, 2012, radiation and chemotherapy, Dr. Tressie Stalker  . Coronary artery disease   . Hyperlipidemia   . Noninsulin dependent diabetes mellitus   . Osteoarthritis     Severe  . Fluid overload   . Renal insufficiency     Mild with higher dose diuretic  . Ejection fraction     EF 60%, echo, September, 2010  . Atrial fibrillation     Maze procedure September, 2010, amiodarone and Coumadin stopped eventually January, 2011  . Shortness of breath   . Statin intolerance     Is  . Hiatal hernia     Past Surgical History  Procedure Laterality Date  . Appendectomy  60 yrs ago  . Tonsilectomy, adenoidectomy, bilateral myringotomy and tubes    . Breast mass excision      left breast 1985  . Tumor removal      from throat  benign  . Choleycystectomy  1998  . Abdominal hysterectomy  07/2010    complete, endometrial cancer  . Coronary artery bypass graft  12/21/08    [place] Gum; LIMA LAD, SVG posterior descending, SVG OM, maze procedure, ligation left atrial appendage and MAZE procedure for A.fib  . Cardiac catheterization    . Cataract extraction    .  Portacath placement    . Port-a-cath removal  01/23/2012    Procedure: REMOVAL PORT-A-CATH;  Surgeon: Jamesetta So, MD;  Location: [facility];  Service: General;  Laterality: N/A;  Minor Room  . Colonoscopy      several in [place], last one by Dr. Rowe Pavy, polyps once in [place]. records requested.  . Colonoscopy  06/2010    Dr. Rodrigo Ran good. Moderate diverticulosis in left colon. Inflammation found in descending colon, scope could not be advanced forward.  ACBE-->sigmoid diverticulosis no worrisome findings.   . Esophagogastroduodenoscopy  03/08/2012    [id number] ring was found at the gastroesophageal junction/Nodular gastritis (inflammation)/duodenal mucosa showed no abnormalities     Patient Active Problem List   Diagnosis Date Noted  . Chronic anticoagulation 03/22/2013  . Mitral regurgitation 03/22/2013  . Aortic sclerosis 03/22/2013  . Pulmonary hypertension 03/22/2013  . Gastritis, atrophic 09/15/2012  . Hiatal hernia 02/24/2012  . Abnormality of esophagus 02/24/2012  . Anemia 02/24/2012  . Statin intolerance   . Hx of CABG   . Coronary artery disease   . Fluid overload   . Renal insufficiency   . Ejection fraction   . Atrial fibrillation   . Endometrial ca 09/25/2010  . SHORTNESS OF BREATH 03/27/2009  . DM 01/23/2009  . HYPERLIPIDEMIA 01/23/2009  .  HYPERTENSION 01/23/2009      Current Outpatient Prescriptions  Medication Sig Dispense Refill  . Calcium Carbonate (CALCIUM 500 PO) Take 500 mg by mouth 2 (two) times daily.    . Cholecalciferol (VITAMIN D PO) Take 400 Units by mouth daily.     Marland Kitchen diltiazem (CARDIZEM CD) 240 MG 24 hr capsule Take 240 mg by mouth daily.    . Diphenhydramine-APAP, sleep, (TYLENOL PM EXTRA STRENGTH PO) Take 1 tablet by mouth at bedtime as needed. Sleep    . furosemide (LASIX) 40 MG tablet Take 20 mg by mouth daily with breakfast.     . glipiZIDE (GLUCOTROL) 5 MG tablet Take 5 mg by mouth daily.     . Glucosamine 500 MG CAPS Take 500 mg  by mouth 2 (two) times daily.      Marland Kitchen HYDROcodone-acetaminophen (NORCO/VICODIN) 5-325 MG per tablet Take 1 tablet by mouth every 8 (eight) hours as needed for moderate pain.    . magnesium oxide (MAG-OX) 400 MG tablet Take 400 mg by mouth daily.    . metFORMIN (GLUCOPHAGE) 500 MG tablet Take 500 mg by mouth at bedtime. Once daily at bedtime    . metoprolol tartrate (LOPRESSOR) 25 MG tablet Take 50 mg by mouth 2 (two) times daily.     . Multiple Vitamin (MULTIVITAMIN) tablet Take 1 tablet by mouth daily.    . Omega-3 Fatty Acids (FISH OIL) 1200 MG CAPS Take 1,200 mg by mouth 2 (two) times daily.    Marland Kitchen omeprazole (PRILOSEC) 20 MG capsule 1 po every morning 30 minutes prior to your first meal. 31 capsule 11  . potassium chloride SA (K-DUR,KLOR-CON) 20 MEQ tablet Take 20 mEq by mouth daily.    . sodium bicarbonate 650 MG tablet Take 650 mg by mouth 3 (three) times daily.    Marland Kitchen warfarin (COUMADIN) 5 MG tablet Take 5 mg by mouth daily. MANAGED BY PMD     No current facility-administered medications for this visit.    Allergies:   Statins    Social History:  The patient  reports that she has never smoked. She has never used smokeless tobacco. She reports that she does not drink alcohol or use illicit drugs.   Family History:  The patient's family history includes Breast cancer in her mother; Diabetes in her sister; Heart attack in her mother; Hypertension in her mother. There is no history of Colon cancer, Ovarian cancer, Lung cancer, or Liver disease.    ROS:  Please see the history of present illness.    Patient denies fever, chills, headache, sweats, rash, change in vision, change in hearing, chest pain, cough, nausea or vomiting, urinary symptoms. All other systems are reviewed and are negative.   PHYSICAL EXAM: VS:  BP 131/79 mmHg  Pulse 99  Ht 5\' 1"  (1.549 m)  Wt 157 lb 12.8 oz (71.578 kg)  BMI 29.83 kg/m2  SpO2 98% , Patient is oriented to person time and place. Affect is normal. Head  is atraumatic. Sclera and conjunctiva are normal. There is no jugular venous distention. Lungs are clear. Respiratory effort is not labored. Cardiac exam reveals an S1 and S2. The abdomen is soft. There is no peripheral edema. There are no musculoskeletal deformities. There are no skin rashes. Neurologic is grossly intact. She is using a rolling walker as she recovers from her last bone fracture.  EKG:   EKG is not done today.   Recent Labs: No results found for requested labs within last 365 days.  Lipid Panel No results found for: CHOL, TRIG, HDL, CHOLHDL, VLDL, LDLCALC, LDLDIRECT    Wt Readings from Last 3 Encounters:  08/18/14 157 lb 12.8 oz (71.578 kg)  10/10/13 163 lb (73.936 kg)  03/22/13 149 lb 12.8 oz (67.949 kg)      Current medicines are reviewed  The patient understands her medications.     ASSESSMENT AND PLAN:

## 2014-08-18 NOTE — Patient Instructions (Signed)
Your physician recommends that you continue on your current medications as directed. Please refer to the Current Medication list given to you today. Your physician recommends that you schedule a follow-up appointment in: 1 year with Dr. Bronson Ing at the [facility] office. You will receive a reminder letter in the mail in about 10 months reminding you to call and schedule your appointment. If you don't receive this letter, please contact our office.

## 2014-08-18 NOTE — Assessment & Plan Note (Signed)
She has aortic valve sclerosis. No further workup is needed.

## 2014-08-18 NOTE — Assessment & Plan Note (Signed)
The patient has asymptomatic chronic atrial fibrillation. Rate is controlled. She is anticoagulated with Coumadin that is followed by her primary physician. No further workup.

## 2015-06-01 DIAGNOSIS — M84454A Pathological fracture, pelvis, initial encounter for fracture: Secondary | ICD-10-CM | POA: Diagnosis not present

## 2015-06-01 DIAGNOSIS — S32302A Unspecified fracture of left ilium, initial encounter for closed fracture: Secondary | ICD-10-CM | POA: Diagnosis not present

## 2015-06-01 DIAGNOSIS — M8448XA Pathological fracture, other site, initial encounter for fracture: Secondary | ICD-10-CM | POA: Diagnosis not present

## 2015-06-01 DIAGNOSIS — S32301A Unspecified fracture of right ilium, initial encounter for closed fracture: Secondary | ICD-10-CM | POA: Diagnosis not present

## 2015-06-01 DIAGNOSIS — S32810A Multiple fractures of pelvis with stable disruption of pelvic ring, initial encounter for closed fracture: Secondary | ICD-10-CM | POA: Diagnosis not present

## 2015-06-05 DIAGNOSIS — I482 Chronic atrial fibrillation: Secondary | ICD-10-CM | POA: Diagnosis not present

## 2015-06-07 DIAGNOSIS — S329XXA Fracture of unspecified parts of lumbosacral spine and pelvis, initial encounter for closed fracture: Secondary | ICD-10-CM | POA: Diagnosis not present

## 2015-06-07 DIAGNOSIS — M8448XP Pathological fracture, other site, subsequent encounter for fracture with malunion: Secondary | ICD-10-CM | POA: Diagnosis not present

## 2015-06-07 DIAGNOSIS — M810 Age-related osteoporosis without current pathological fracture: Secondary | ICD-10-CM | POA: Diagnosis not present

## 2015-06-14 DIAGNOSIS — I482 Chronic atrial fibrillation: Secondary | ICD-10-CM | POA: Diagnosis not present

## 2015-06-14 DIAGNOSIS — E1159 Type 2 diabetes mellitus with other circulatory complications: Secondary | ICD-10-CM | POA: Diagnosis not present

## 2015-06-14 DIAGNOSIS — M810 Age-related osteoporosis without current pathological fracture: Secondary | ICD-10-CM | POA: Diagnosis not present

## 2015-06-14 DIAGNOSIS — Z6830 Body mass index (BMI) 30.0-30.9, adult: Secondary | ICD-10-CM | POA: Diagnosis not present

## 2015-07-19 DIAGNOSIS — I482 Chronic atrial fibrillation: Secondary | ICD-10-CM | POA: Diagnosis not present

## 2015-08-09 DIAGNOSIS — M179 Osteoarthritis of knee, unspecified: Secondary | ICD-10-CM | POA: Diagnosis not present

## 2015-08-16 ENCOUNTER — Encounter: Payer: Self-pay | Admitting: Cardiovascular Disease

## 2015-08-16 ENCOUNTER — Ambulatory Visit (INDEPENDENT_AMBULATORY_CARE_PROVIDER_SITE_OTHER): Payer: Medicare Other | Admitting: Cardiovascular Disease

## 2015-08-16 VITALS — BP 136/91 | HR 74 | Ht 61.0 in | Wt 176.0 lb

## 2015-08-16 DIAGNOSIS — Z7901 Long term (current) use of anticoagulants: Secondary | ICD-10-CM | POA: Diagnosis not present

## 2015-08-16 DIAGNOSIS — I10 Essential (primary) hypertension: Secondary | ICD-10-CM

## 2015-08-16 DIAGNOSIS — R0602 Shortness of breath: Secondary | ICD-10-CM

## 2015-08-16 DIAGNOSIS — I482 Chronic atrial fibrillation: Secondary | ICD-10-CM

## 2015-08-16 DIAGNOSIS — I4821 Permanent atrial fibrillation: Secondary | ICD-10-CM

## 2015-08-16 DIAGNOSIS — I25768 Atherosclerosis of bypass graft of coronary artery of transplanted heart with other forms of angina pectoris: Secondary | ICD-10-CM | POA: Diagnosis not present

## 2015-08-16 MED ORDER — METOPROLOL SUCCINATE ER 50 MG PO TB24: ORAL_TABLET | ORAL | Status: DC

## 2015-08-16 NOTE — Progress Notes (Signed)
Patient ID: [name], female   DOB: 03-26-1931, 82 y.o.   MRN: [id number]      SUBJECTIVE: The patient is an 82 year old woman with a history of coronary artery disease and permanent atrial fibrillation. She has a history of CABG. She is a former patient of Dr. Ron Parker and this is my first time meeting her.  ECG performed in the office today which I personally interpreted demonstrates rapid atrial fibrillation with a diffuse nonspecific ST segment and T-wave abnormalities, heart rate 106 bpm.  She denies chest pain. She occasionally experiences shortness of breath with exertion performing activities of daily living. She also has a history of endometrial cancer and is status post chemotherapy and radiation therapy. This has resulted in four pelvic fractures. She currently uses a walker.   Review of Systems: As per "subjective", otherwise negative.  Allergies  Allergen Reactions  . Statins Other (See Comments)    No energy, fatigue.     Current Outpatient Prescriptions  Medication Sig Dispense Refill  . calcitonin, salmon, (MIACALCIN/FORTICAL) 200 UNIT/ACT nasal spray Place 1 spray into alternate nostrils daily.    . Calcium Carbonate (CALCIUM 500 PO) Take 500 mg by mouth 2 (two) times daily.    . Cholecalciferol (VITAMIN D PO) Take 400 Units by mouth daily.     Marland Kitchen diltiazem (CARDIZEM CD) 240 MG 24 hr capsule Take 240 mg by mouth daily.    . Diphenhydramine-APAP, sleep, (TYLENOL PM EXTRA STRENGTH PO) Take 1 tablet by mouth at bedtime as needed. Sleep    . furosemide (LASIX) 20 MG tablet Take 1 tablet by mouth daily.  0  . glipiZIDE (GLUCOTROL) 5 MG tablet Take 5 mg by mouth daily.     . Glucosamine 500 MG CAPS Take 500 mg by mouth 2 (two) times daily.      Marland Kitchen HYDROcodone-acetaminophen (NORCO/VICODIN) 5-325 MG per tablet Take 1 tablet by mouth every 8 (eight) hours as needed for moderate pain.    . magnesium oxide (MAG-OX) 400 MG tablet Take 400 mg by mouth daily.    . metFORMIN  (GLUCOPHAGE) 500 MG tablet Take 500 mg by mouth at bedtime. Once daily at bedtime    . metoprolol succinate (TOPROL-XL) 50 MG 24 hr tablet Take 1 tablet by mouth 2 (two) times daily.  0  . Multiple Vitamin (MULTIVITAMIN) tablet Take 1 tablet by mouth daily.    . Omega-3 Fatty Acids (FISH OIL) 1200 MG CAPS Take 1,200 mg by mouth 2 (two) times daily.    . potassium chloride SA (K-DUR,KLOR-CON) 20 MEQ tablet Take 20 mEq by mouth daily.    . sodium bicarbonate 650 MG tablet Take 650 mg by mouth 3 (three) times daily.    Marland Kitchen warfarin (COUMADIN) 5 MG tablet Take 5 mg by mouth daily. MANAGED BY PMD     No current facility-administered medications for this visit.    Past Medical History  Diagnosis Date  . Hx of CABG     2010, Dr.Van Trigt, Maze procedure, ligation of left atrial appendage, Coumadin stopped January, 2011  . HTN (hypertension)   . Endometrial ca [facility]) 09/25/2010    June, 2012, radiation and chemotherapy, Dr. Tressie Stalker  . Coronary artery disease   . Hyperlipidemia   . Noninsulin dependent diabetes mellitus   . Osteoarthritis     Severe  . Fluid overload   . Renal insufficiency     Mild with higher dose diuretic  . Ejection fraction     EF 60%, echo,  September, 2010  . Atrial fibrillation [facility])     Maze procedure September, 2010, amiodarone and Coumadin stopped eventually January, 2011  . Shortness of breath   . Statin intolerance     Is  . Hiatal hernia     Past Surgical History  Procedure Laterality Date  . Appendectomy  60 yrs ago  . Tonsilectomy, adenoidectomy, bilateral myringotomy and tubes    . Breast mass excision      left breast 1985  . Tumor removal      from throat  benign  . Choleycystectomy  1998  . Abdominal hysterectomy  07/2010    complete, endometrial cancer  . Coronary artery bypass graft  12/21/08    Prescott Gum; LIMA LAD, SVG posterior descending, SVG OM, maze procedure, ligation left atrial appendage and MAZE procedure for A.fib  . Cardiac  catheterization    . Cataract extraction    . Portacath placement    . Port-a-cath removal  01/23/2012    Procedure: REMOVAL PORT-A-CATH;  Surgeon: Jamesetta So, MD;  Location: [facility];  Service: General;  Laterality: N/A;  Minor Room  . Colonoscopy      several in [place], last one by Dr. Rowe Pavy, polyps once in [place]. records requested.  . Colonoscopy  06/2010    Dr. Rodrigo Ran good. Moderate diverticulosis in left colon. Inflammation found in descending colon, scope could not be advanced forward.  ACBE-->sigmoid diverticulosis no worrisome findings.   . Esophagogastroduodenoscopy  03/08/2012    [id number] ring was found at the gastroesophageal junction/Nodular gastritis (inflammation)/duodenal mucosa showed no abnormalities     Social History   Social History  . Marital Status: Widowed    Spouse Name: N/A  . Number of Children: 1  . Years of Education: N/A   Occupational History  . Retired    Social History Main Topics  . Smoking status: Never Smoker   . Smokeless tobacco: Never Used  . Alcohol Use: No  . Drug Use: No  . Sexual Activity: Not on file   Other Topics Concern  . Not on file   Social History Narrative     Filed Vitals:   08/16/15 0948  BP: 136/91  Pulse: 74  Height: 5\' 1"  (1.549 m)  Weight: 176 lb (79.833 kg)  SpO2: 98%    PHYSICAL EXAM General: NAD HEENT: Normal. Neck: No JVD, no thyromegaly. Lungs: Clear to auscultation bilaterally with normal respiratory effort. CV: Nondisplaced PMI.  Tachycardic, irregular rhythm, normal S1/S2, no S3, no murmur. No pretibial or periankle edema.  No carotid bruit.   Abdomen: Soft, nontender, no distention.  Neurologic: Alert and oriented.  Psych: Normal affect. Skin: Normal. Musculoskeletal: No gross deformities.    ECG: Most recent ECG reviewed.      ASSESSMENT AND PLAN: 1. CAD s/p CABG: Symptomatically stable. Statin intolerant. Continue Toprol-XL.  2. Shortness of breath in context of  rapid, permanent atrial fibrillation: Anticoagulated with warfarin. Followed by PCP. Will increase Toprol-Xl to 75 mg BID for more optimal HR control.  3. Essential HTN: Monitor given increase in metoprolol dose.  Dispo: fu 1 year.   Kate Sable, M.D., F.A.C.C.

## 2015-08-16 NOTE — Patient Instructions (Addendum)
   Increase Toprol XL to 75mg  twice a day  - new sent to [facility] today. Continue all other medications.   Your physician wants you to follow up in:  1 year.  You will receive a reminder letter in the mail one-two months in advance.  If you don't receive a letter, please call our office to schedule the follow up appointment

## 2015-08-20 ENCOUNTER — Encounter: Payer: Self-pay | Admitting: Gastroenterology

## 2015-08-30 DIAGNOSIS — I482 Chronic atrial fibrillation: Secondary | ICD-10-CM | POA: Diagnosis not present

## 2015-09-13 DIAGNOSIS — H26491 Other secondary cataract, right eye: Secondary | ICD-10-CM | POA: Diagnosis not present

## 2015-09-13 DIAGNOSIS — E119 Type 2 diabetes mellitus without complications: Secondary | ICD-10-CM | POA: Diagnosis not present

## 2015-09-13 DIAGNOSIS — Z961 Presence of intraocular lens: Secondary | ICD-10-CM | POA: Diagnosis not present

## 2015-09-14 DIAGNOSIS — E1159 Type 2 diabetes mellitus with other circulatory complications: Secondary | ICD-10-CM | POA: Diagnosis not present

## 2015-09-14 DIAGNOSIS — Z6830 Body mass index (BMI) 30.0-30.9, adult: Secondary | ICD-10-CM | POA: Diagnosis not present

## 2015-09-14 DIAGNOSIS — M810 Age-related osteoporosis without current pathological fracture: Secondary | ICD-10-CM | POA: Diagnosis not present

## 2015-09-14 DIAGNOSIS — I482 Chronic atrial fibrillation: Secondary | ICD-10-CM | POA: Diagnosis not present

## 2015-09-18 DIAGNOSIS — Z1231 Encounter for screening mammogram for malignant neoplasm of breast: Secondary | ICD-10-CM | POA: Diagnosis not present

## 2015-10-19 DIAGNOSIS — I482 Chronic atrial fibrillation: Secondary | ICD-10-CM | POA: Diagnosis not present

## 2015-11-02 DIAGNOSIS — I482 Chronic atrial fibrillation: Secondary | ICD-10-CM | POA: Diagnosis not present

## 2015-11-21 DIAGNOSIS — Z8542 Personal history of malignant neoplasm of other parts of uterus: Secondary | ICD-10-CM | POA: Diagnosis not present

## 2015-11-21 DIAGNOSIS — E875 Hyperkalemia: Secondary | ICD-10-CM | POA: Diagnosis not present

## 2015-11-21 DIAGNOSIS — N390 Urinary tract infection, site not specified: Secondary | ICD-10-CM | POA: Diagnosis not present

## 2015-11-21 DIAGNOSIS — I110 Hypertensive heart disease with heart failure: Secondary | ICD-10-CM | POA: Diagnosis not present

## 2015-11-21 DIAGNOSIS — I482 Chronic atrial fibrillation: Secondary | ICD-10-CM | POA: Diagnosis not present

## 2015-11-21 DIAGNOSIS — I2510 Atherosclerotic heart disease of native coronary artery without angina pectoris: Secondary | ICD-10-CM | POA: Diagnosis not present

## 2015-11-21 DIAGNOSIS — I5041 Acute combined systolic (congestive) and diastolic (congestive) heart failure: Secondary | ICD-10-CM | POA: Diagnosis not present

## 2015-11-21 DIAGNOSIS — E785 Hyperlipidemia, unspecified: Secondary | ICD-10-CM | POA: Diagnosis not present

## 2015-11-21 DIAGNOSIS — N1330 Unspecified hydronephrosis: Secondary | ICD-10-CM | POA: Diagnosis not present

## 2015-11-21 DIAGNOSIS — E119 Type 2 diabetes mellitus without complications: Secondary | ICD-10-CM | POA: Diagnosis not present

## 2015-11-21 DIAGNOSIS — Z951 Presence of aortocoronary bypass graft: Secondary | ICD-10-CM | POA: Diagnosis not present

## 2015-11-23 DIAGNOSIS — N1330 Unspecified hydronephrosis: Secondary | ICD-10-CM | POA: Diagnosis not present

## 2015-12-06 DIAGNOSIS — I5022 Chronic systolic (congestive) heart failure: Secondary | ICD-10-CM | POA: Diagnosis not present

## 2015-12-06 DIAGNOSIS — I10 Essential (primary) hypertension: Secondary | ICD-10-CM | POA: Diagnosis not present

## 2015-12-06 DIAGNOSIS — Z6830 Body mass index (BMI) 30.0-30.9, adult: Secondary | ICD-10-CM | POA: Diagnosis not present

## 2015-12-06 DIAGNOSIS — E784 Other hyperlipidemia: Secondary | ICD-10-CM | POA: Diagnosis not present

## 2015-12-06 DIAGNOSIS — I482 Chronic atrial fibrillation: Secondary | ICD-10-CM | POA: Diagnosis not present

## 2015-12-27 DIAGNOSIS — I482 Chronic atrial fibrillation: Secondary | ICD-10-CM | POA: Diagnosis not present

## 2015-12-27 DIAGNOSIS — M25561 Pain in right knee: Secondary | ICD-10-CM | POA: Diagnosis not present

## 2015-12-27 DIAGNOSIS — M25562 Pain in left knee: Secondary | ICD-10-CM | POA: Diagnosis not present

## 2015-12-27 DIAGNOSIS — M170 Bilateral primary osteoarthritis of knee: Secondary | ICD-10-CM | POA: Diagnosis not present

## 2016-01-01 DIAGNOSIS — Z23 Encounter for immunization: Secondary | ICD-10-CM | POA: Diagnosis not present

## 2016-01-25 DIAGNOSIS — I482 Chronic atrial fibrillation: Secondary | ICD-10-CM | POA: Diagnosis not present

## 2016-03-06 DIAGNOSIS — E784 Other hyperlipidemia: Secondary | ICD-10-CM | POA: Diagnosis not present

## 2016-03-06 DIAGNOSIS — E1165 Type 2 diabetes mellitus with hyperglycemia: Secondary | ICD-10-CM | POA: Diagnosis not present

## 2016-03-06 DIAGNOSIS — Z1389 Encounter for screening for other disorder: Secondary | ICD-10-CM | POA: Diagnosis not present

## 2016-03-06 DIAGNOSIS — Z0000 Encounter for general adult medical examination without abnormal findings: Secondary | ICD-10-CM | POA: Diagnosis not present

## 2016-03-06 DIAGNOSIS — I482 Chronic atrial fibrillation: Secondary | ICD-10-CM | POA: Diagnosis not present

## 2016-03-06 DIAGNOSIS — Z6827 Body mass index (BMI) 27.0-27.9, adult: Secondary | ICD-10-CM | POA: Diagnosis not present

## 2016-03-06 DIAGNOSIS — I5022 Chronic systolic (congestive) heart failure: Secondary | ICD-10-CM | POA: Diagnosis not present

## 2016-03-06 DIAGNOSIS — I10 Essential (primary) hypertension: Secondary | ICD-10-CM | POA: Diagnosis not present

## 2016-04-10 DIAGNOSIS — I482 Chronic atrial fibrillation: Secondary | ICD-10-CM | POA: Diagnosis not present

## 2016-04-10 DIAGNOSIS — M25562 Pain in left knee: Secondary | ICD-10-CM | POA: Diagnosis not present

## 2016-04-10 DIAGNOSIS — M170 Bilateral primary osteoarthritis of knee: Secondary | ICD-10-CM | POA: Diagnosis not present

## 2016-04-10 DIAGNOSIS — M25561 Pain in right knee: Secondary | ICD-10-CM | POA: Diagnosis not present

## 2016-05-16 DIAGNOSIS — I482 Chronic atrial fibrillation: Secondary | ICD-10-CM | POA: Diagnosis not present

## 2016-05-16 DIAGNOSIS — M8448XP Pathological fracture, other site, subsequent encounter for fracture with malunion: Secondary | ICD-10-CM | POA: Diagnosis not present

## 2016-05-29 DIAGNOSIS — M810 Age-related osteoporosis without current pathological fracture: Secondary | ICD-10-CM | POA: Diagnosis not present

## 2016-06-03 DIAGNOSIS — I10 Essential (primary) hypertension: Secondary | ICD-10-CM | POA: Diagnosis not present

## 2016-06-03 DIAGNOSIS — I482 Chronic atrial fibrillation: Secondary | ICD-10-CM | POA: Diagnosis not present

## 2016-06-03 DIAGNOSIS — E784 Other hyperlipidemia: Secondary | ICD-10-CM | POA: Diagnosis not present

## 2016-06-03 DIAGNOSIS — E1165 Type 2 diabetes mellitus with hyperglycemia: Secondary | ICD-10-CM | POA: Diagnosis not present

## 2016-07-15 DIAGNOSIS — I482 Chronic atrial fibrillation: Secondary | ICD-10-CM | POA: Diagnosis not present

## 2016-08-07 DIAGNOSIS — M25562 Pain in left knee: Secondary | ICD-10-CM | POA: Diagnosis not present

## 2016-08-07 DIAGNOSIS — M25561 Pain in right knee: Secondary | ICD-10-CM | POA: Diagnosis not present

## 2016-08-07 DIAGNOSIS — M170 Bilateral primary osteoarthritis of knee: Secondary | ICD-10-CM | POA: Diagnosis not present

## 2016-08-21 ENCOUNTER — Encounter: Payer: Self-pay | Admitting: Cardiovascular Disease

## 2016-08-21 ENCOUNTER — Ambulatory Visit (INDEPENDENT_AMBULATORY_CARE_PROVIDER_SITE_OTHER): Payer: Medicare Other | Admitting: Cardiovascular Disease

## 2016-08-21 VITALS — BP 142/72 | HR 61 | Ht 61.0 in | Wt 162.0 lb

## 2016-08-21 DIAGNOSIS — I25768 Atherosclerosis of bypass graft of coronary artery of transplanted heart with other forms of angina pectoris: Secondary | ICD-10-CM | POA: Diagnosis not present

## 2016-08-21 DIAGNOSIS — I10 Essential (primary) hypertension: Secondary | ICD-10-CM

## 2016-08-21 DIAGNOSIS — Z7901 Long term (current) use of anticoagulants: Secondary | ICD-10-CM | POA: Diagnosis not present

## 2016-08-21 DIAGNOSIS — I482 Chronic atrial fibrillation: Secondary | ICD-10-CM

## 2016-08-21 DIAGNOSIS — I4821 Permanent atrial fibrillation: Secondary | ICD-10-CM

## 2016-08-21 NOTE — Patient Instructions (Signed)

## 2016-08-21 NOTE — Progress Notes (Signed)
SUBJECTIVE: The patient is an 83 year old woman with a history of coronary artery disease and permanent atrial fibrillation. She has a history of CABG. She also has a history of endometrial cancer and is status post chemotherapy and radiation therapy. This has resulted in four pelvic fractures. She currently uses a walker.  She denies chest pain, palpitations, dizziness, leg swelling, and shortness of breath.  She tells me she was hospitalized at [facility] [place] in February for a stomach virus and dehydration.  She otherwise has no complaints.  ECG performed in the office today which I ordered them personally interpreted demonstrated atrial fibrillation, 70 bpm.    Review of Systems: As per "subjective", otherwise negative.  Allergies  Allergen Reactions  . Statins Other (See Comments)    No energy, fatigue.     Current Outpatient Prescriptions  Medication Sig Dispense Refill  . calcitonin, salmon, (MIACALCIN/FORTICAL) 200 UNIT/ACT nasal spray Place 1 spray into alternate nostrils daily.    . Calcium Carbonate (CALCIUM 500 PO) Take 500 mg by mouth 2 (two) times daily.    . Cholecalciferol (VITAMIN D PO) Take 400 Units by mouth daily.     Marland Kitchen diltiazem (CARDIZEM CD) 240 MG 24 hr capsule Take 240 mg by mouth daily.    . Diphenhydramine-APAP, sleep, (TYLENOL PM EXTRA STRENGTH PO) Take 1 tablet by mouth at bedtime as needed. Sleep    . furosemide (LASIX) 20 MG tablet Take 1 tablet by mouth daily.  0  . glipiZIDE (GLUCOTROL) 5 MG tablet Take 5 mg by mouth daily.     . Glucosamine 500 MG CAPS Take 500 mg by mouth 2 (two) times daily.      Marland Kitchen HYDROcodone-acetaminophen (NORCO/VICODIN) 5-325 MG per tablet Take 1 tablet by mouth every 8 (eight) hours as needed for moderate pain.    . magnesium oxide (MAG-OX) 400 MG tablet Take 400 mg by mouth daily.    . metFORMIN (GLUCOPHAGE) 500 MG tablet Take 500 mg by mouth at bedtime. Once daily at bedtime    . metoprolol succinate (TOPROL-XL) 50 MG  24 hr tablet Take 1 1/2 tabs (75mg ) by mouth twice a day 90 tablet 11  . Multiple Vitamin (MULTIVITAMIN) tablet Take 1 tablet by mouth daily.    . Omega-3 Fatty Acids (FISH OIL) 1200 MG CAPS Take 1,200 mg by mouth 2 (two) times daily.    . sodium bicarbonate 650 MG tablet Take 650 mg by mouth 3 (three) times daily.    Marland Kitchen UNKNOWN TO PATIENT Takes an injection twice yearly for bones    . warfarin (COUMADIN) 5 MG tablet Take 5 mg by mouth daily. MANAGED BY PMD     No current facility-administered medications for this visit.     Past Medical History:  Diagnosis Date  . Atrial fibrillation [facility])    Maze procedure September, 2010, amiodarone and Coumadin stopped eventually January, 2011  . Coronary artery disease   . Ejection fraction    EF 60%, echo, September, 2010  . Endometrial ca [facility]) 09/25/2010   June, 2012, radiation and chemotherapy, Dr. Tressie Stalker  . Fluid overload   . Hiatal hernia   . HTN (hypertension)   . Hx of CABG    2010, Dr.Van Trigt, Maze procedure, ligation of left atrial appendage, Coumadin stopped January, 2011  . Hyperlipidemia   . Noninsulin dependent diabetes mellitus   . Osteoarthritis    Severe  . Renal insufficiency    Mild with higher dose diuretic  .  Shortness of breath   . Statin intolerance    Is    Past Surgical History:  Procedure Laterality Date  . ABDOMINAL HYSTERECTOMY  07/2010   complete, endometrial cancer  . APPENDECTOMY  60 yrs ago  . BREAST MASS EXCISION     left breast 1985  . CARDIAC CATHETERIZATION    . CATARACT EXTRACTION    . choleycystectomy  1998  . COLONOSCOPY     several in [place], last one by Dr. Rowe Pavy, polyps once in [place]. records requested.  . COLONOSCOPY  06/2010   Dr. Rodrigo Ran good. Moderate diverticulosis in left colon. Inflammation found in descending colon, scope could not be advanced forward.  ACBE-->sigmoid diverticulosis no worrisome findings.   . CORONARY ARTERY BYPASS GRAFT  12/21/08   Pres[place] Gum; LIMA LAD,  SVG posterior descending, SVG OM, maze procedure, ligation left atrial appendage and MAZE procedure for A.fib  . ESOPHAGOGASTRODUODENOSCOPY  03/08/2012   [id number] ring was found at the gastroesophageal junction/Nodular gastritis (inflammation)/duodenal mucosa showed no abnormalities   . PORT-A-CATH REMOVAL  01/23/2012   Procedure: REMOVAL PORT-A-CATH;  Surgeon: Jamesetta So, MD;  Location: [facility];  Service: General;  Laterality: N/A;  Minor Room  . PORTACATH PLACEMENT    . TONSILECTOMY, ADENOIDECTOMY, BILATERAL MYRINGOTOMY AND TUBES    . TUMOR REMOVAL     from throat  benign    Social History   Social History  . Marital status: Widowed    Spouse name: N/A  . Number of children: 1  . Years of education: N/A   Occupational History  . Retired Retired   Social History Main Topics  . Smoking status: Never Smoker  . Smokeless tobacco: Never Used  . Alcohol use No  . Drug use: No  . Sexual activity: Not on file   Other Topics Concern  . Not on file   Social History Narrative  . No narrative on file     Vitals:   08/21/16 1359  BP: (!) 142/72  Pulse: 61  SpO2: 98%  Weight: 162 lb (73.5 kg)  Height: 5\' 1"  (1.549 m)    Wt Readings from Last 3 Encounters:  08/21/16 162 lb (73.5 kg)  08/16/15 176 lb (79.8 kg)  08/18/14 157 lb 12.8 oz (71.6 kg)     PHYSICAL EXAM General: NAD HEENT: Normal. Neck: No JVD, no thyromegaly. Lungs: Clear to auscultation bilaterally with normal respiratory effort. CV: Nondisplaced PMI.  Regular rate and irregular rhythm, normal S1/S2, no S3, no murmur. No pretibial or periankle edema.  No carotid bruit.   Abdomen: Soft, nontender, no distention.  Neurologic: Alert and oriented.  Psych: Normal affect. Skin: Normal. Musculoskeletal: No gross deformities.    ECG: Most recent ECG reviewed.   Labs: Lab Results  Component Value Date/Time   K 4.3 11/08/2012 09:59 AM   BUN 38 (H) 11/08/2012 09:59 AM   CREATININE 1.31 (H) 11/08/2012  09:59 AM   ALT 11 11/08/2012 09:59 AM   HGB 11.1 (L) 11/08/2012 09:59 AM     Lipids: No results found for: LDLCALC, LDLDIRECT, CHOL, TRIG, HDL     ASSESSMENT AND PLAN: 1. CAD s/p CABG: Stable ischemic heart disease. Statin intolerant. Continue metoprolol succinate.  2. Permanent atrial fibrillation: Heart rate controlled on long-acting metoprolol and long-acting diltiazem. Anticoagulant with warfarin. No changes to therapy.  3. Essential HTN: Mildly elevated. Will monitor.    Disposition: Follow up 1 yr  Kate Sable, M.D., F.A.C.C.

## 2016-09-02 DIAGNOSIS — I482 Chronic atrial fibrillation: Secondary | ICD-10-CM | POA: Diagnosis not present

## 2016-09-02 DIAGNOSIS — E1165 Type 2 diabetes mellitus with hyperglycemia: Secondary | ICD-10-CM | POA: Diagnosis not present

## 2016-09-02 DIAGNOSIS — I10 Essential (primary) hypertension: Secondary | ICD-10-CM | POA: Diagnosis not present

## 2016-09-02 DIAGNOSIS — E784 Other hyperlipidemia: Secondary | ICD-10-CM | POA: Diagnosis not present

## 2016-09-09 ENCOUNTER — Other Ambulatory Visit: Payer: Self-pay | Admitting: Cardiovascular Disease

## 2016-09-23 DIAGNOSIS — Z1231 Encounter for screening mammogram for malignant neoplasm of breast: Secondary | ICD-10-CM | POA: Diagnosis not present

## 2016-10-10 DIAGNOSIS — I482 Chronic atrial fibrillation: Secondary | ICD-10-CM | POA: Diagnosis not present

## 2016-12-02 DIAGNOSIS — I872 Venous insufficiency (chronic) (peripheral): Secondary | ICD-10-CM | POA: Diagnosis not present

## 2016-12-02 DIAGNOSIS — E119 Type 2 diabetes mellitus without complications: Secondary | ICD-10-CM | POA: Diagnosis not present

## 2016-12-02 DIAGNOSIS — I5022 Chronic systolic (congestive) heart failure: Secondary | ICD-10-CM | POA: Diagnosis not present

## 2016-12-02 DIAGNOSIS — E784 Other hyperlipidemia: Secondary | ICD-10-CM | POA: Diagnosis not present

## 2016-12-02 DIAGNOSIS — I482 Chronic atrial fibrillation: Secondary | ICD-10-CM | POA: Diagnosis not present

## 2016-12-09 DIAGNOSIS — M170 Bilateral primary osteoarthritis of knee: Secondary | ICD-10-CM | POA: Diagnosis not present

## 2016-12-09 DIAGNOSIS — M25561 Pain in right knee: Secondary | ICD-10-CM | POA: Diagnosis not present

## 2016-12-09 DIAGNOSIS — M25562 Pain in left knee: Secondary | ICD-10-CM | POA: Diagnosis not present

## 2017-01-06 DIAGNOSIS — Z7901 Long term (current) use of anticoagulants: Secondary | ICD-10-CM | POA: Diagnosis not present

## 2017-01-06 DIAGNOSIS — Z23 Encounter for immunization: Secondary | ICD-10-CM | POA: Diagnosis not present

## 2017-02-13 DIAGNOSIS — I482 Chronic atrial fibrillation: Secondary | ICD-10-CM | POA: Diagnosis not present

## 2017-03-05 DIAGNOSIS — I482 Chronic atrial fibrillation: Secondary | ICD-10-CM | POA: Diagnosis not present

## 2017-03-05 DIAGNOSIS — E119 Type 2 diabetes mellitus without complications: Secondary | ICD-10-CM | POA: Diagnosis not present

## 2017-03-05 DIAGNOSIS — E7849 Other hyperlipidemia: Secondary | ICD-10-CM | POA: Diagnosis not present

## 2017-03-05 DIAGNOSIS — I5022 Chronic systolic (congestive) heart failure: Secondary | ICD-10-CM | POA: Diagnosis not present

## 2017-03-05 DIAGNOSIS — I10 Essential (primary) hypertension: Secondary | ICD-10-CM | POA: Diagnosis not present

## 2017-04-14 DIAGNOSIS — I482 Chronic atrial fibrillation: Secondary | ICD-10-CM | POA: Diagnosis not present

## 2017-05-12 DIAGNOSIS — I482 Chronic atrial fibrillation: Secondary | ICD-10-CM | POA: Diagnosis not present

## 2017-05-12 DIAGNOSIS — M170 Bilateral primary osteoarthritis of knee: Secondary | ICD-10-CM | POA: Diagnosis not present

## 2017-05-12 DIAGNOSIS — S32810S Multiple fractures of pelvis with stable disruption of pelvic ring, sequela: Secondary | ICD-10-CM | POA: Diagnosis not present

## 2017-06-02 DIAGNOSIS — E119 Type 2 diabetes mellitus without complications: Secondary | ICD-10-CM | POA: Diagnosis not present

## 2017-06-02 DIAGNOSIS — E7849 Other hyperlipidemia: Secondary | ICD-10-CM | POA: Diagnosis not present

## 2017-06-02 DIAGNOSIS — I10 Essential (primary) hypertension: Secondary | ICD-10-CM | POA: Diagnosis not present

## 2017-06-02 DIAGNOSIS — I872 Venous insufficiency (chronic) (peripheral): Secondary | ICD-10-CM | POA: Diagnosis not present

## 2017-06-02 DIAGNOSIS — I482 Chronic atrial fibrillation: Secondary | ICD-10-CM | POA: Diagnosis not present

## 2017-06-11 DIAGNOSIS — E113393 Type 2 diabetes mellitus with moderate nonproliferative diabetic retinopathy without macular edema, bilateral: Secondary | ICD-10-CM | POA: Diagnosis not present

## 2017-07-21 DIAGNOSIS — I482 Chronic atrial fibrillation: Secondary | ICD-10-CM | POA: Diagnosis not present

## 2017-08-10 ENCOUNTER — Other Ambulatory Visit: Payer: Self-pay | Admitting: Cardiovascular Disease

## 2017-08-18 DIAGNOSIS — I482 Chronic atrial fibrillation: Secondary | ICD-10-CM | POA: Diagnosis not present

## 2017-08-27 ENCOUNTER — Ambulatory Visit: Payer: Medicare Other | Admitting: Cardiovascular Disease

## 2017-09-04 DIAGNOSIS — E119 Type 2 diabetes mellitus without complications: Secondary | ICD-10-CM | POA: Diagnosis not present

## 2017-09-04 DIAGNOSIS — I482 Chronic atrial fibrillation: Secondary | ICD-10-CM | POA: Diagnosis not present

## 2017-09-04 DIAGNOSIS — E7849 Other hyperlipidemia: Secondary | ICD-10-CM | POA: Diagnosis not present

## 2017-09-04 DIAGNOSIS — I10 Essential (primary) hypertension: Secondary | ICD-10-CM | POA: Diagnosis not present

## 2017-09-29 DIAGNOSIS — E7849 Other hyperlipidemia: Secondary | ICD-10-CM | POA: Diagnosis not present

## 2017-09-29 DIAGNOSIS — Z6831 Body mass index (BMI) 31.0-31.9, adult: Secondary | ICD-10-CM | POA: Diagnosis not present

## 2017-09-29 DIAGNOSIS — I482 Chronic atrial fibrillation: Secondary | ICD-10-CM | POA: Diagnosis not present

## 2017-09-29 DIAGNOSIS — I5022 Chronic systolic (congestive) heart failure: Secondary | ICD-10-CM | POA: Diagnosis not present

## 2017-10-09 DIAGNOSIS — Z1231 Encounter for screening mammogram for malignant neoplasm of breast: Secondary | ICD-10-CM | POA: Diagnosis not present

## 2017-11-06 ENCOUNTER — Other Ambulatory Visit: Payer: Self-pay | Admitting: Cardiovascular Disease

## 2017-11-10 ENCOUNTER — Encounter: Payer: Self-pay | Admitting: Cardiovascular Disease

## 2017-11-10 ENCOUNTER — Ambulatory Visit: Payer: Medicare Other | Admitting: Cardiovascular Disease

## 2017-11-10 VITALS — BP 142/72 | Ht 61.0 in | Wt 178.0 lb

## 2017-11-10 DIAGNOSIS — I25708 Atherosclerosis of coronary artery bypass graft(s), unspecified, with other forms of angina pectoris: Secondary | ICD-10-CM | POA: Diagnosis not present

## 2017-11-10 DIAGNOSIS — I10 Essential (primary) hypertension: Secondary | ICD-10-CM

## 2017-11-10 DIAGNOSIS — Z789 Other specified health status: Secondary | ICD-10-CM

## 2017-11-10 DIAGNOSIS — I4821 Permanent atrial fibrillation: Secondary | ICD-10-CM

## 2017-11-10 DIAGNOSIS — E7849 Other hyperlipidemia: Secondary | ICD-10-CM | POA: Diagnosis not present

## 2017-11-10 DIAGNOSIS — I5022 Chronic systolic (congestive) heart failure: Secondary | ICD-10-CM | POA: Diagnosis not present

## 2017-11-10 DIAGNOSIS — I482 Chronic atrial fibrillation: Secondary | ICD-10-CM | POA: Diagnosis not present

## 2017-11-10 DIAGNOSIS — Z7901 Long term (current) use of anticoagulants: Secondary | ICD-10-CM

## 2017-11-10 DIAGNOSIS — R600 Localized edema: Secondary | ICD-10-CM

## 2017-11-10 DIAGNOSIS — Z6831 Body mass index (BMI) 31.0-31.9, adult: Secondary | ICD-10-CM | POA: Diagnosis not present

## 2017-11-10 DIAGNOSIS — I4891 Unspecified atrial fibrillation: Secondary | ICD-10-CM

## 2017-11-10 NOTE — Patient Instructions (Signed)

## 2017-11-10 NOTE — Progress Notes (Signed)
SUBJECTIVE: The patient presents for annual follow-up.  She has a history of coronary artery disease and CABG as well as permanent atrial fibrillation. She also has a history of endometrial cancer and is status post chemotherapy and radiation therapy. This has resulted in four pelvic fractures. She currently uses a walker.  The patient denies any symptoms of chest pain, palpitations, shortness of breath, lightheadedness, dizziness, orthopnea, PND, and syncope.  She has some mild bilateral leg swelling for which she uses compression stockings.  She likes to read.  She does her activities of daily living without difficulty.  She has a person who comes and does the cleaning once every 2 weeks.  She underwent CABG in 2010.  Echocardiogram on 01/04/2013 showed normal left ventricular systolic function, LVEF 55 to 60%, mild right ventricular enlargement, mildly reduced right ventricular systolic function, moderate biatrial enlargement, mild to moderate mitral and moderate tricuspid regurgitation.  There is moderate aortic valve sclerosis.  ECG performed in the office today which I ordered and personally interpreted demonstrates rate controlled atrial fibrillation, 79 bpm.  Her sister, [name], is also my patient.  She also has atrial fibrillation.    Review of Systems: As per "subjective", otherwise negative.  Allergies  Allergen Reactions  . Statins Other (See Comments)    No energy, fatigue.     Current Outpatient Medications  Medication Sig Dispense Refill  . calcitonin, salmon, (MIACALCIN/FORTICAL) 200 UNIT/ACT nasal spray Place 1 spray into alternate nostrils daily.    . Calcium Carbonate (CALCIUM 500 PO) Take 500 mg by mouth 2 (two) times daily.    . Cholecalciferol (VITAMIN D PO) Take 400 Units by mouth daily.     Marland Kitchen diltiazem (CARDIZEM CD) 240 MG 24 hr capsule Take 240 mg by mouth daily.    . Diphenhydramine-APAP, sleep, (TYLENOL PM EXTRA STRENGTH PO) Take 1 tablet by  mouth at bedtime as needed. Sleep    . furosemide (LASIX) 20 MG tablet Take 1 tablet by mouth daily.  0  . glipiZIDE (GLUCOTROL) 5 MG tablet Take 5 mg by mouth daily.     . Glucosamine 500 MG CAPS Take 500 mg by mouth 2 (two) times daily.      Marland Kitchen HYDROcodone-acetaminophen (NORCO/VICODIN) 5-325 MG per tablet Take 1 tablet by mouth every 8 (eight) hours as needed for moderate pain.    . magnesium oxide (MAG-OX) 400 MG tablet Take 400 mg by mouth daily.    . metFORMIN (GLUCOPHAGE) 500 MG tablet Take 500 mg by mouth at bedtime. Once daily at bedtime    . metoprolol succinate (TOPROL-XL) 50 MG 24 hr tablet TAKE 1 AND 1/2 TABLETS BY MOUTH TWICE DAILY 270 tablet 0  . Multiple Vitamin (MULTIVITAMIN) tablet Take 1 tablet by mouth daily.    . Omega-3 Fatty Acids (FISH OIL) 1200 MG CAPS Take 1,200 mg by mouth 2 (two) times daily.    . sodium bicarbonate 650 MG tablet Take 650 mg by mouth 3 (three) times daily.    Marland Kitchen UNKNOWN TO PATIENT Takes an injection twice yearly for bones    . warfarin (COUMADIN) 5 MG tablet Take 5 mg by mouth daily. MANAGED BY PMD     No current facility-administered medications for this visit.     Past Medical History:  Diagnosis Date  . Atrial fibrillation [facility])    Maze procedure September, 2010, amiodarone and Coumadin stopped eventually January, 2011  . Coronary artery disease   . Ejection fraction  EF 60%, echo, September, 2010  . Endometrial ca [facility]) 09/25/2010   June, 2012, radiation and chemotherapy, Dr. Tressie Stalker  . Fluid overload   . Hiatal hernia   . HTN (hypertension)   . Hx of CABG    2010, Dr.Van Trigt, Maze procedure, ligation of left atrial appendage, Coumadin stopped January, 2011  . Hyperlipidemia   . Noninsulin dependent diabetes mellitus   . Osteoarthritis    Severe  . Renal insufficiency    Mild with higher dose diuretic  . Shortness of breath   . Statin intolerance    Is    Past Surgical History:  Procedure Laterality Date  . ABDOMINAL  HYSTERECTOMY  07/2010   complete, endometrial cancer  . APPENDECTOMY  60 yrs ago  . BREAST MASS EXCISION     left breast 1985  . CARDIAC CATHETERIZATION    . CATARACT EXTRACTION    . choleycystectomy  1998  . COLONOSCOPY     several in [place], last one by Dr. Rowe Pavy, polyps once in [place]. records requested.  . COLONOSCOPY  06/2010   Dr. Rodrigo Ran good. Moderate diverticulosis in left colon. Inflammation found in descending colon, scope could not be advanced forward.  ACBE-->sigmoid diverticulosis no worrisome findings.   . CORONARY ARTERY BYPASS GRAFT  12/21/08   Prescott Gum; LIMA LAD, SVG posterior descending, SVG OM, maze procedure, ligation left atrial appendage and MAZE procedure for A.fib  . ESOPHAGOGASTRODUODENOSCOPY  03/08/2012   [id number] ring was found at the gastroesophageal junction/Nodular gastritis (inflammation)/duodenal mucosa showed no abnormalities   . PORT-A-CATH REMOVAL  01/23/2012   Procedure: REMOVAL PORT-A-CATH;  Surgeon: Jamesetta So, MD;  Location: [facility];  Service: General;  Laterality: N/A;  Minor Room  . PORTACATH PLACEMENT    . TONSILECTOMY, ADENOIDECTOMY, BILATERAL MYRINGOTOMY AND TUBES    . TUMOR REMOVAL     from throat  benign    Social History   Socioeconomic History  . Marital status: Widowed    Spouse name: Not on file  . Number of children: 1  . Years of education: Not on file  . Highest education level: Not on file  Occupational History  . Occupation: Retired    Fish farm manager: RETIRED  Social Needs  . Financial resource strain: Not on file  . Food insecurity:    Worry: Not on file    Inability: Not on file  . Transportation needs:    Medical: Not on file    Non-medical: Not on file  Tobacco Use  . Smoking status: Never Smoker  . Smokeless tobacco: Never Used  Substance and Sexual Activity  . Alcohol use: No    Alcohol/week: 0.0 oz  . Drug use: No  . Sexual activity: Not on file  Lifestyle  . Physical activity:    Days per week:  Not on file    Minutes per session: Not on file  . Stress: Not on file  Relationships  . Social connections:    Talks on phone: Not on file    Gets together: Not on file    Attends religious service: Not on file    Active member of club or organization: Not on file    Attends meetings of clubs or organizations: Not on file    Relationship status: Not on file  . Intimate partner violence:    Fear of current or ex partner: Not on file    Emotionally abused: Not on file    Physically abused: Not on file  Forced sexual activity: Not on file  Other Topics Concern  . Not on file  Social History Narrative  . Not on file     Vitals:   11/10/17 1015  BP: (!) 142/72  SpO2: 97%  Weight: 178 lb (80.7 kg)  Height: 5\' 1"  (1.549 m)    Wt Readings from Last 3 Encounters:  11/10/17 178 lb (80.7 kg)  08/21/16 162 lb (73.5 kg)  08/16/15 176 lb (79.8 kg)     PHYSICAL EXAM General: NAD HEENT: Normal. Neck: No JVD, no thyromegaly. Lungs: Clear to auscultation bilaterally with normal respiratory effort. CV: Regular rate and irregular rhythm, normal S1/S2, no S3, no murmur.  Trace bilateral lower extremity edema.  Abdomen: Soft, nontender, no distention.  Neurologic: Alert and oriented.  Psych: Normal affect. Skin: Normal. Musculoskeletal: No gross deformities.    ECG: Reviewed above under Subjective   Labs: Lab Results  Component Value Date/Time   K 4.3 11/08/2012 09:59 AM   BUN 38 (H) 11/08/2012 09:59 AM   CREATININE 1.31 (H) 11/08/2012 09:59 AM   ALT 11 11/08/2012 09:59 AM   HGB 11.1 (L) 11/08/2012 09:59 AM     Lipids: No results found for: LDLCALC, LDLDIRECT, CHOL, TRIG, HDL     ASSESSMENT AND PLAN:  1. CAD s/p CABG: Stable ischemic heart disease. Statin intolerant. Continue metoprolol succinate.  2. Permanent atrial fibrillation: Heart rate controlled on long-acting metoprolol and long-acting diltiazem. Anticoagulated with warfarin. No changes to  therapy.  3. Essential HTN: Mildly elevated.  No changes to therapy.  4.  Bilateral leg edema: Stable with compression stockings and low-dose Lasix.  No changes.   Disposition: Follow up 1 year   Kate Sable, M.D., F.A.C.C.

## 2017-12-10 DIAGNOSIS — Z6831 Body mass index (BMI) 31.0-31.9, adult: Secondary | ICD-10-CM | POA: Diagnosis not present

## 2017-12-10 DIAGNOSIS — I482 Chronic atrial fibrillation: Secondary | ICD-10-CM | POA: Diagnosis not present

## 2017-12-10 DIAGNOSIS — E7849 Other hyperlipidemia: Secondary | ICD-10-CM | POA: Diagnosis not present

## 2017-12-10 DIAGNOSIS — Z0000 Encounter for general adult medical examination without abnormal findings: Secondary | ICD-10-CM | POA: Diagnosis not present

## 2017-12-10 DIAGNOSIS — I10 Essential (primary) hypertension: Secondary | ICD-10-CM | POA: Diagnosis not present

## 2017-12-10 DIAGNOSIS — E119 Type 2 diabetes mellitus without complications: Secondary | ICD-10-CM | POA: Diagnosis not present

## 2018-01-05 DIAGNOSIS — Z23 Encounter for immunization: Secondary | ICD-10-CM | POA: Diagnosis not present

## 2018-01-29 DIAGNOSIS — I482 Chronic atrial fibrillation, unspecified: Secondary | ICD-10-CM | POA: Diagnosis not present

## 2018-02-06 ENCOUNTER — Other Ambulatory Visit: Payer: Self-pay | Admitting: Cardiovascular Disease

## 2018-03-11 DIAGNOSIS — Z6833 Body mass index (BMI) 33.0-33.9, adult: Secondary | ICD-10-CM | POA: Diagnosis not present

## 2018-03-11 DIAGNOSIS — Z7901 Long term (current) use of anticoagulants: Secondary | ICD-10-CM | POA: Diagnosis not present

## 2018-03-11 DIAGNOSIS — Z0000 Encounter for general adult medical examination without abnormal findings: Secondary | ICD-10-CM | POA: Diagnosis not present

## 2018-04-08 DIAGNOSIS — Z7901 Long term (current) use of anticoagulants: Secondary | ICD-10-CM | POA: Diagnosis not present

## 2018-04-30 DIAGNOSIS — M170 Bilateral primary osteoarthritis of knee: Secondary | ICD-10-CM | POA: Diagnosis not present

## 2018-05-13 DIAGNOSIS — M810 Age-related osteoporosis without current pathological fracture: Secondary | ICD-10-CM | POA: Diagnosis not present

## 2018-05-27 DIAGNOSIS — Z7901 Long term (current) use of anticoagulants: Secondary | ICD-10-CM | POA: Diagnosis not present

## 2018-06-08 DIAGNOSIS — I10 Essential (primary) hypertension: Secondary | ICD-10-CM | POA: Diagnosis not present

## 2018-06-08 DIAGNOSIS — I480 Paroxysmal atrial fibrillation: Secondary | ICD-10-CM | POA: Diagnosis not present

## 2018-06-08 DIAGNOSIS — E785 Hyperlipidemia, unspecified: Secondary | ICD-10-CM | POA: Diagnosis not present

## 2018-06-08 DIAGNOSIS — E1121 Type 2 diabetes mellitus with diabetic nephropathy: Secondary | ICD-10-CM | POA: Diagnosis not present

## 2018-07-20 DIAGNOSIS — I480 Paroxysmal atrial fibrillation: Secondary | ICD-10-CM | POA: Diagnosis not present

## 2018-09-07 DIAGNOSIS — E1121 Type 2 diabetes mellitus with diabetic nephropathy: Secondary | ICD-10-CM | POA: Diagnosis not present

## 2018-09-07 DIAGNOSIS — E785 Hyperlipidemia, unspecified: Secondary | ICD-10-CM | POA: Diagnosis not present

## 2018-09-07 DIAGNOSIS — I480 Paroxysmal atrial fibrillation: Secondary | ICD-10-CM | POA: Diagnosis not present

## 2018-09-07 DIAGNOSIS — I10 Essential (primary) hypertension: Secondary | ICD-10-CM | POA: Diagnosis not present

## 2018-10-22 DIAGNOSIS — I480 Paroxysmal atrial fibrillation: Secondary | ICD-10-CM | POA: Diagnosis not present

## 2018-10-31 ENCOUNTER — Other Ambulatory Visit: Payer: Self-pay | Admitting: Cardiovascular Disease

## 2018-11-30 ENCOUNTER — Telehealth: Payer: Self-pay | Admitting: Cardiovascular Disease

## 2018-11-30 NOTE — Telephone Encounter (Signed)
Virtual Visit Pre-Appointment Phone Call  "(Name), I am calling you today to discuss your upcoming appointment. We are currently trying to limit exposure to the virus that causes COVID-19 by seeing patients at home rather than in the office."  1. "What is the BEST phone number to call the day of the visit?" - include this in appointment notes  2. Do you have or have access to (through a family member/friend) a smartphone with video capability that we can use for your visit?" a. If yes - list this number in appt notes as cell (if different from BEST phone #) and list the appointment type as a VIDEO visit in appointment notes b. If no - list the appointment type as a PHONE visit in appointment notes  3. Confirm consent - "In the setting of the current Covid19 crisis, you are scheduled for a (phone or video) visit with your provider on (date) at (time).  Just as we do with many in-office visits, in order for you to participate in this visit, we must obtain consent.  If you'd like, I can send this to your mychart (if signed up) or email for you to review.  Otherwise, I can obtain your verbal consent now.  All virtual visits are billed to your insurance company just like a normal visit would be.  By agreeing to a virtual visit, we'd like you to understand that the technology does not allow for your provider to perform an examination, and thus may limit your provider's ability to fully assess your condition. If your provider identifies any concerns that need to be evaluated in person, we will make arrangements to do so.  Finally, though the technology is pretty good, we cannot assure that it will always work on either your or our end, and in the setting of a video visit, we may have to convert it to a phone-only visit.  In either situation, we cannot ensure that we have a secure connection.  Are you willing to proceed?" STAFF: Did the patient verbally acknowledge consent to telehealth visit? Document  YES/NO here: yes  4. Advise patient to be prepared - "Two hours prior to your appointment, go ahead and check your blood pressure, pulse, oxygen saturation, and your weight (if you have the equipment to check those) and write them all down. When your visit starts, your provider will ask you for this information. If you have an Apple Watch or Kardia device, please plan to have heart rate information ready on the day of your appointment. Please have a pen and paper handy nearby the day of the visit as well."  5. Give patient instructions for MyChart download to smartphone OR Doximity/Doxy.me as below if video visit (depending on what platform provider is using)  6. Inform patient they will receive a phone call 15 minutes prior to their appointment time (may be from unknown caller ID) so they should be prepared to answer    TELEPHONE CALL NOTE  [name] has been deemed a candidate for a follow-up tele-health visit to limit community exposure during the Covid-19 pandemic. I spoke with the patient via phone to ensure availability of phone/video source, confirm preferred email & phone number, and discuss instructions and expectations.  I reminded [name] to be prepared with any vital sign and/or heart rhythm information that could potentially be obtained via home monitoring, at the time of her visit. I reminded [name] to expect a phone call prior to  her visit.  [name] 11/30/2018 2:18 PM   INSTRUCTIONS FOR DOWNLOADING THE MYCHART APP TO SMARTPHONE  - The patient must first make sure to have activated MyChart and know their login information - If Apple, go to [employer] and type in MyChart in the search bar and download the app. If Android, ask patient to go to [employer] and type in Rand in the search bar and download the app. The app is free but as with any other app downloads, their phone may require them to verify saved payment information or  Apple/Android password.  - The patient will need to then log into the app with their MyChart username and password, and select [facility] as their healthcare provider to link the account. When it is time for your visit, go to the MyChart app, find appointments, and click Begin Video Visit. Be sure to Select Allow for your device to access the Microphone and Camera for your visit. You will then be connected, and your provider will be with you shortly.  **If they have any issues connecting, or need assistance please contact MyChart service desk [phone])**  **If using a computer, in order to ensure the best quality for their visit they will need to use either of the following Internet Browsers: [employer], or Google Chrome**  IF USING DOXIMITY or DOXY.ME - The patient will receive a link just prior to their visit by text.     FULL LENGTH CONSENT FOR TELE-HEALTH VISIT   I hereby voluntarily request, consent and authorize [facility] and its employed or contracted physicians, physician assistants, nurse practitioners or other licensed health care professionals (the Practitioner), to provide me with telemedicine health care services (the Services") as deemed necessary by the treating Practitioner. I acknowledge and consent to receive the Services by the Practitioner via telemedicine. I understand that the telemedicine visit will involve communicating with the Practitioner through live audiovisual communication technology and the disclosure of certain medical information by electronic transmission. I acknowledge that I have been given the opportunity to request an in-person assessment or other available alternative prior to the telemedicine visit and am voluntarily participating in the telemedicine visit.  I understand that I have the right to withhold or withdraw my consent to the use of telemedicine in the course of my care at any time, without affecting my right to future care  or treatment, and that the Practitioner or I may terminate the telemedicine visit at any time. I understand that I have the right to inspect all information obtained and/or recorded in the course of the telemedicine visit and may receive copies of available information for a reasonable fee.  I understand that some of the potential risks of receiving the Services via telemedicine include:   Delay or interruption in medical evaluation due to technological equipment failure or disruption;  Information transmitted may not be sufficient (e.g. poor resolution of images) to allow for appropriate medical decision making by the Practitioner; and/or   In rare instances, security protocols could fail, causing a breach of personal health information.  Furthermore, I acknowledge that it is my responsibility to provide information about my medical history, conditions and care that is complete and accurate to the best of my ability. I acknowledge that Practitioner's advice, recommendations, and/or decision may be based on factors not within their control, such as incomplete or inaccurate data provided by me or distortions of diagnostic images or specimens that may result from electronic transmissions. I  understand that the practice of medicine is not an exact science and that Practitioner makes no warranties or guarantees regarding treatment outcomes. I acknowledge that I will receive a copy of this consent concurrently upon execution via email to the email address I last provided but may also request a printed copy by calling the office of [facility].    I understand that my insurance will be billed for this visit.   I have read or had this consent read to me.  I understand the contents of this consent, which adequately explains the benefits and risks of the Services being provided via telemedicine.   I have been provided ample opportunity to ask questions regarding this consent and the Services and have had  my questions answered to my satisfaction.  I give my informed consent for the services to be provided through the use of telemedicine in my medical care  By participating in this telemedicine visit I agree to the above.

## 2018-12-02 ENCOUNTER — Telehealth (INDEPENDENT_AMBULATORY_CARE_PROVIDER_SITE_OTHER): Payer: Medicare Other | Admitting: Cardiovascular Disease

## 2018-12-02 ENCOUNTER — Encounter: Payer: Self-pay | Admitting: Cardiovascular Disease

## 2018-12-02 VITALS — BP 128/66 | HR 63 | Ht 61.0 in | Wt 167.0 lb

## 2018-12-02 DIAGNOSIS — I25708 Atherosclerosis of coronary artery bypass graft(s), unspecified, with other forms of angina pectoris: Secondary | ICD-10-CM

## 2018-12-02 DIAGNOSIS — I10 Essential (primary) hypertension: Secondary | ICD-10-CM

## 2018-12-02 DIAGNOSIS — Z7901 Long term (current) use of anticoagulants: Secondary | ICD-10-CM

## 2018-12-02 DIAGNOSIS — R600 Localized edema: Secondary | ICD-10-CM

## 2018-12-02 DIAGNOSIS — I4891 Unspecified atrial fibrillation: Secondary | ICD-10-CM

## 2018-12-02 DIAGNOSIS — Z951 Presence of aortocoronary bypass graft: Secondary | ICD-10-CM

## 2018-12-02 DIAGNOSIS — I4821 Permanent atrial fibrillation: Secondary | ICD-10-CM

## 2018-12-02 NOTE — Progress Notes (Signed)
Virtual Visit via Telephone Note   This visit type was conducted due to national recommendations for restrictions regarding the COVID-19 Pandemic (e.g. social distancing) in an effort to limit this patient's exposure and mitigate transmission in our community.  Due to her co-morbid illnesses, this patient is at least at moderate risk for complications without adequate follow up.  This format is felt to be most appropriate for this patient at this time.  The patient did not have access to video technology/had technical difficulties with video requiring transitioning to audio format only (telephone).  All issues noted in this document were discussed and addressed.  No physical exam could be performed with this format.  Please refer to the patient's chart for her  consent to telehealth for [facility].   Date:  12/02/2018   ID:  Lindsay, Lee 04-02-1931, MRN [id number]  Patient Location: Home Provider Location: Office  PCP:  Neale Burly, MD  Cardiologist:  Kate Sable, MD  Electrophysiologist:  None   Evaluation Performed:  Follow-Up Visit  Chief Complaint:  CAD, a fib  History of Present Illness:    [name] is a 85 y.o. female with a history of coronary artery disease and CABG as well as permanent atrial fibrillation.  She underwent CABG in 2010. She also has a history of endometrial cancer and is status post chemotherapy and radiation therapy. This has resulted in four pelvic fractures. She currently uses a walker.  She uses compression stockings occasionally for bilateral leg swelling which is mild. She also takes Lasix.  She has occasional palpitations. She denies chest pain.  The patient does not have symptoms concerning for COVID-19 infection (fever, chills, cough, or new shortness of breath).   Soc Hx: Her sister, [name], is also my patient.  She also has atrial fibrillation.   Past Medical History:  Diagnosis Date  . Atrial fibrillation [facility])     Maze procedure September, 2010, amiodarone and Coumadin stopped eventually January, 2011  . Coronary artery disease   . Ejection fraction    EF 60%, echo, September, 2010  . Endometrial ca [facility]) 09/25/2010   June, 2012, radiation and chemotherapy, Dr. Tressie Stalker  . Fluid overload   . Hiatal hernia   . HTN (hypertension)   . Hx of CABG    2010, Dr.Van Trigt, Maze procedure, ligation of left atrial appendage, Coumadin stopped January, 2011  . Hyperlipidemia   . Noninsulin dependent diabetes mellitus   . Osteoarthritis    Severe  . Renal insufficiency    Mild with higher dose diuretic  . Shortness of breath   . Statin intolerance    Is   Past Surgical History:  Procedure Laterality Date  . ABDOMINAL HYSTERECTOMY  07/2010   complete, endometrial cancer  . APPENDECTOMY  60 yrs ago  . BREAST MASS EXCISION     left breast 1985  . CARDIAC CATHETERIZATION    . CATARACT EXTRACTION    . choleycystectomy  1998  . COLONOSCOPY     several in [place], last one by Dr. Rowe Pavy, polyps once in [place]. records requested.  . COLONOSCOPY  06/2010   Dr. Rodrigo Ran good. Moderate diverticulosis in left colon. Inflammation found in descending colon, scope could not be advanced forward.  ACBE-->sigmoid diverticulosis no worrisome findings.   . CORONARY ARTERY BYPASS GRAFT  12/21/08   [place] Gum; LIMA LAD, SVG posterior descending, SVG OM, maze procedure, ligation left atrial appendage and MAZE procedure for A.fib  . ESOPHAGOGASTRODUODENOSCOPY  03/08/2012   [id number] ring was found at the gastroesophageal junction/Nodular gastritis (inflammation)/duodenal mucosa showed no abnormalities   . PORT-A-CATH REMOVAL  01/23/2012   Procedure: REMOVAL PORT-A-CATH;  Surgeon: Jamesetta So, MD;  Location: [facility];  Service: General;  Laterality: N/A;  Minor Room  . PORTACATH PLACEMENT    . TONSILECTOMY, ADENOIDECTOMY, BILATERAL MYRINGOTOMY AND TUBES    . TUMOR REMOVAL     from throat  benign     Current  Meds  Medication Sig  . alendronate (FOSAMAX) 70 MG tablet Take 70 mg by mouth once a week. Take with a full glass of water on an empty stomach.  . Calcium Carbonate (CALCIUM 500 PO) Take 500 mg by mouth 2 (two) times daily.  . Cholecalciferol (VITAMIN D PO) Take 400 Units by mouth daily.   Marland Kitchen denosumab (PROLIA) 60 MG/ML SOSY injection Inject 60 mg into the skin every 6 (six) months.  . diltiazem (CARDIZEM CD) 240 MG 24 hr capsule Take 240 mg by mouth daily.  . Diphenhydramine-APAP, sleep, (TYLENOL PM EXTRA STRENGTH PO) Take 1 tablet by mouth at bedtime as needed. Sleep  . furosemide (LASIX) 20 MG tablet Take 1 tablet by mouth daily.  Marland Kitchen glipiZIDE (GLUCOTROL) 5 MG tablet Take 5 mg by mouth daily.   . Glucosamine 500 MG CAPS Take 500 mg by mouth 2 (two) times daily.    Marland Kitchen HYDROcodone-acetaminophen (NORCO/VICODIN) 5-325 MG per tablet Take 1 tablet by mouth every 8 (eight) hours as needed for moderate pain.  . magnesium oxide (MAG-OX) 400 MG tablet Take 400 mg by mouth daily.  . metFORMIN (GLUCOPHAGE) 500 MG tablet Take 500 mg by mouth at bedtime. Once daily at bedtime  . metoprolol succinate (TOPROL-XL) 50 MG 24 hr tablet TAKE 1 AND 1/2 TABLETS BY MOUTH TWICE DAILY  . Multiple Vitamin (MULTIVITAMIN) tablet Take 1 tablet by mouth daily.  . Omega-3 Fatty Acids (FISH OIL) 1200 MG CAPS Take 1,200 mg by mouth 2 (two) times daily.  . sodium bicarbonate 650 MG tablet Take 650 mg by mouth 3 (three) times daily.  Marland Kitchen warfarin (COUMADIN) 5 MG tablet Take 5 mg by mouth daily. MANAGED BY PMD     Allergies:   Statins   Social History   Tobacco Use  . Smoking status: Never Smoker  . Smokeless tobacco: Never Used  Substance Use Topics  . Alcohol use: No    Alcohol/week: 0.0 standard drinks  . Drug use: No     Family Hx: The patient's family history includes Breast cancer in her mother; Diabetes in her sister; Heart attack in her mother; Hypertension in her mother. There is no history of Colon cancer,  Ovarian cancer, Lung cancer, or Liver disease.  ROS:   Please see the history of present illness.     All other systems reviewed and are negative.   Prior CV studies:   The following studies were reviewed today:  Echocardiogram on 01/04/2013 showed normal left ventricular systolic function, LVEF 55 to 60%, mild right ventricular enlargement, mildly reduced right ventricular systolic function, moderate biatrial enlargement, mild to moderate mitral and moderate tricuspid regurgitation.  There is moderate aortic valve sclerosis.  Labs/Other Tests and Data Reviewed:    EKG:  No ECG reviewed.  Recent Labs: No results found for requested labs within last 8760 hours.   Recent Lipid Panel No results found for: CHOL, TRIG, HDL, CHOLHDL, LDLCALC, LDLDIRECT  Wt Readings from Last 3 Encounters:  12/02/18 167 lb (75.8 kg)  11/10/17 178 lb (80.7 kg)  08/21/16 162 lb (73.5 kg)     Objective:    Vital Signs:  BP 128/66   Pulse 63   Ht 5\' 1"  (1.549 m)   Wt 167 lb (75.8 kg)   BMI 31.55 kg/m    VITAL SIGNS:  reviewed  ASSESSMENT & PLAN:    1. CAD s/p CABG:Stable ischemic heart disease. Statin intolerant. Continue metoprolol succinate.  I will obtain a follow-up echocardiogram.  2.Permanent atrial fibrillation:Heart rate controlled on long-acting metoprolol and long-acting diltiazem. Anticoagulated with warfarin. No changes to therapy.  3. Essential [id number].  No changes to therapy.  4. Bilateral leg edema: Stable with compression stockings and low-dose Lasix.  No changes.   COVID-19 Education: The signs and symptoms of COVID-19 were discussed with the patient and how to seek care for testing (follow up with PCP or arrange E-visit).  The importance of social distancing was discussed today.  Time:   Today, I have spent 10 minutes with the patient with telehealth technology discussing the above problems.     Medication Adjustments/Labs and Tests Ordered: Current medicines  are reviewed at length with the patient today.  Concerns regarding medicines are outlined above.   Tests Ordered: No orders of the defined types were placed in this encounter.   Medication Changes: No orders of the defined types were placed in this encounter.   Follow Up:  Virtual Visit or In Person in 1 year(s)  Signed, Kate Sable, MD  12/02/2018 11:31 AM    [facility]

## 2018-12-02 NOTE — Patient Instructions (Signed)

## 2018-12-02 NOTE — Addendum Note (Signed)
Addended by: Laurine Blazer on: 12/02/2018 11:45 AM   Modules accepted: Orders

## 2018-12-03 DIAGNOSIS — M810 Age-related osteoporosis without current pathological fracture: Secondary | ICD-10-CM | POA: Diagnosis not present

## 2018-12-08 ENCOUNTER — Other Ambulatory Visit: Payer: Self-pay | Admitting: Cardiovascular Disease

## 2018-12-09 ENCOUNTER — Other Ambulatory Visit: Payer: Self-pay | Admitting: Cardiovascular Disease

## 2018-12-09 DIAGNOSIS — I2510 Atherosclerotic heart disease of native coronary artery without angina pectoris: Secondary | ICD-10-CM

## 2018-12-21 DIAGNOSIS — I10 Essential (primary) hypertension: Secondary | ICD-10-CM | POA: Diagnosis not present

## 2018-12-21 DIAGNOSIS — I480 Paroxysmal atrial fibrillation: Secondary | ICD-10-CM | POA: Diagnosis not present

## 2018-12-21 DIAGNOSIS — E782 Mixed hyperlipidemia: Secondary | ICD-10-CM | POA: Diagnosis not present

## 2018-12-21 DIAGNOSIS — E1121 Type 2 diabetes mellitus with diabetic nephropathy: Secondary | ICD-10-CM | POA: Diagnosis not present

## 2018-12-21 DIAGNOSIS — E785 Hyperlipidemia, unspecified: Secondary | ICD-10-CM | POA: Diagnosis not present

## 2018-12-30 ENCOUNTER — Other Ambulatory Visit: Payer: Medicare Other

## 2019-01-13 ENCOUNTER — Other Ambulatory Visit: Payer: Medicare Other

## 2019-01-20 ENCOUNTER — Other Ambulatory Visit: Payer: Medicare Other

## 2019-01-25 ENCOUNTER — Other Ambulatory Visit: Payer: Self-pay

## 2019-01-25 ENCOUNTER — Telehealth: Payer: Self-pay | Admitting: *Deleted

## 2019-01-25 ENCOUNTER — Ambulatory Visit (INDEPENDENT_AMBULATORY_CARE_PROVIDER_SITE_OTHER): Payer: Medicare Other

## 2019-01-25 DIAGNOSIS — I2510 Atherosclerotic heart disease of native coronary artery without angina pectoris: Secondary | ICD-10-CM | POA: Diagnosis not present

## 2019-01-25 NOTE — Telephone Encounter (Signed)
Notes recorded by Laurine Blazer, LPN on [id number] at 5:03 PM EDT  Patient notified. Copy to pmd.  ------   Notes recorded by Herminio Commons, MD on 01/25/2019 at 4:08 PM EDT  Normal pumping function.

## 2019-02-24 DIAGNOSIS — I480 Paroxysmal atrial fibrillation: Secondary | ICD-10-CM | POA: Diagnosis not present

## 2019-03-22 DIAGNOSIS — I10 Essential (primary) hypertension: Secondary | ICD-10-CM | POA: Diagnosis not present

## 2019-03-22 DIAGNOSIS — E782 Mixed hyperlipidemia: Secondary | ICD-10-CM | POA: Diagnosis not present

## 2019-03-22 DIAGNOSIS — E119 Type 2 diabetes mellitus without complications: Secondary | ICD-10-CM | POA: Diagnosis not present

## 2019-03-22 DIAGNOSIS — Z7901 Long term (current) use of anticoagulants: Secondary | ICD-10-CM | POA: Diagnosis not present

## 2019-03-22 DIAGNOSIS — I480 Paroxysmal atrial fibrillation: Secondary | ICD-10-CM | POA: Diagnosis not present

## 2019-04-05 DIAGNOSIS — H524 Presbyopia: Secondary | ICD-10-CM | POA: Diagnosis not present

## 2019-04-05 DIAGNOSIS — E113312 Type 2 diabetes mellitus with moderate nonproliferative diabetic retinopathy with macular edema, left eye: Secondary | ICD-10-CM | POA: Diagnosis not present

## 2019-04-05 DIAGNOSIS — E113311 Type 2 diabetes mellitus with moderate nonproliferative diabetic retinopathy with macular edema, right eye: Secondary | ICD-10-CM | POA: Diagnosis not present

## 2019-04-05 DIAGNOSIS — Z961 Presence of intraocular lens: Secondary | ICD-10-CM | POA: Diagnosis not present

## 2019-04-28 ENCOUNTER — Ambulatory Visit: Payer: Medicare Other | Attending: Internal Medicine

## 2019-04-28 DIAGNOSIS — Z23 Encounter for immunization: Secondary | ICD-10-CM | POA: Insufficient documentation

## 2019-04-28 NOTE — Progress Notes (Signed)
   Covid-19 Vaccination Clinic  Name:  NANNETTA TURK    MRN: [id number] DOB: [birth year]  04/28/2019  Ms. Codd was observed post Covid-19 immunization for 30 minutes based on pre-vaccination screening without incidence. She was provided with Vaccine Information Sheet and instruction to access the V-Safe system.   Ms. Landero was instructed to call 911 with any severe reactions post vaccine: Marland Kitchen Difficulty breathing  . Swelling of your face and throat  . A fast heartbeat  . A bad rash all over your body  . Dizziness and weakness    Immunizations Administered    Name Date Dose VIS Date Route   Pfizer COVID-19 Vaccine 04/28/2019  8:57 AM 0.3 mL 03/18/2019 Intramuscular   Manufacturer: [place]   Lot: [id number]   [place]: [id number]

## 2019-05-19 ENCOUNTER — Ambulatory Visit: Payer: Medicare Other | Attending: Internal Medicine

## 2019-05-19 DIAGNOSIS — Z23 Encounter for immunization: Secondary | ICD-10-CM | POA: Insufficient documentation

## 2019-05-19 NOTE — Progress Notes (Signed)
   Covid-19 Vaccination Clinic  Name:  [name]    MRN: [id number] DOB: 06/01/1930  05/19/2019  [name] was observed post Covid-19 immunization for 15 minutes without incidence. She was provided with Vaccine Information Sheet and instruction to access the V-Safe system.   [name] was instructed to call 911 with any severe reactions post vaccine: Marland Kitchen Difficulty breathing  . Swelling of your face and throat  . A fast heartbeat  . A bad rash all over your body  . Dizziness and weakness    Immunizations Administered    Name Date Dose VIS Date Route   Pfizer COVID-19 Vaccine 05/19/2019  8:21 AM 0.3 mL 03/18/2019 Intramuscular   Manufacturer: [place]   Lot: [id number]   [place]: [id number]

## 2019-05-27 DIAGNOSIS — I480 Paroxysmal atrial fibrillation: Secondary | ICD-10-CM | POA: Diagnosis not present

## 2019-06-20 NOTE — Progress Notes (Addendum)
[facility] Note  06/24/2019     CHIEF COMPLAINT Patient presents for Diabetic Eye Exam   HISTORY OF PRESENT ILLNESS: [name] is a 86 y.o. female who presents to the clinic today for:   HPI    Diabetic Eye Exam    Vision is stable.  Associated Symptoms Negative for Flashes, Blind Spot, Photophobia, Scalp Tenderness, Fever, Floaters, Pain, Glare, Jaw Claudication, Weight Loss, Distortion, Redness, Trauma, Shoulder/Hip pain and Fatigue.  Diabetes characteristics include Type 2 and taking oral medications.  Blood sugar level is controlled.  Last Blood Glucose 113 (Yesterday am).  Last A1C 7.1 (6 months ago).  I, the attending physician,  performed the HPI with the patient and updated documentation appropriately.          Comments    Patient states vision seems good OU. Last a1c was 7.1, checked 6 months ago. BS was 113, checked yesterday am. Patient on coumadin for a.fib.       Last edited by Bernarda Caffey, MD on 06/24/2019  1:07 PM. (History)    pt was referred here by Dr. Leticia Clas for a diabetic exam, pt states she does not have any complaints about her vision, she has not changed her glasses in about 15 years, pt had cataract sx with Dr. Iona Hansen OU in 2006  Referring physician: Leticia Clas, [address] [place],  [place] [address]  HISTORICAL INFORMATION:   Selected notes from the MEDICAL RECORD NUMBER Referred by Dr. Leticia Clas for concern of NPDR with mac edema OU LEE: 12.29.20 Leander Rams) [BCVA: OD: 20/30- OS: 20/40-] Ocular Hx-pseduo OU Iona Hansen, '06), PC fibrosis, PCO, PVD, asteroid OU, nevus OD, drusen OU, HTN ret OU PMH-   CURRENT MEDICATIONS: No current outpatient medications on file. (Ophthalmic Drugs)   No current facility-administered medications for this visit. (Ophthalmic Drugs)   Current Outpatient Medications (Other)  Medication Sig  . alendronate (FOSAMAX) 70 MG tablet Take 70 mg by mouth once a week.  Take with a full glass of water on an empty stomach.  . Calcium Carbonate (CALCIUM 500 PO) Take 500 mg by mouth 2 (two) times daily.  . Cholecalciferol (VITAMIN D PO) Take 400 Units by mouth daily.   Marland Kitchen denosumab (PROLIA) 60 MG/ML SOSY injection Inject 60 mg into the skin every 6 (six) months.  . diltiazem (CARDIZEM CD) 240 MG 24 hr capsule Take 240 mg by mouth daily.  . Diphenhydramine-APAP, sleep, (TYLENOL PM EXTRA STRENGTH PO) Take 1 tablet by mouth at bedtime as needed. Sleep  . furosemide (LASIX) 20 MG tablet Take 1 tablet by mouth daily.  Marland Kitchen glipiZIDE (GLUCOTROL) 5 MG tablet Take 5 mg by mouth daily.   . Glucosamine 500 MG CAPS Take 500 mg by mouth 2 (two) times daily.    Marland Kitchen HYDROcodone-acetaminophen (NORCO/VICODIN) 5-325 MG per tablet Take 1 tablet by mouth every 8 (eight) hours as needed for moderate pain.  . magnesium oxide (MAG-OX) 400 MG tablet Take 400 mg by mouth daily.  . metFORMIN (GLUCOPHAGE) 500 MG tablet Take 500 mg by mouth at bedtime. Once daily at bedtime  . metoprolol succinate (TOPROL-XL) 50 MG 24 hr tablet TAKE 1 AND 1/2 TABLETS BY MOUTH TWICE DAILY  . Multiple Vitamin (MULTIVITAMIN) tablet Take 1 tablet by mouth daily.  . Omega-3 Fatty Acids (FISH OIL) 1200 MG CAPS Take 1,200 mg by mouth 2 (two) times daily.  . sodium bicarbonate 650 MG tablet Take  650 mg by mouth 3 (three) times daily.  Marland Kitchen warfarin (COUMADIN) 5 MG tablet Take 5 mg by mouth daily. MANAGED BY PMD   No current facility-administered medications for this visit. (Other)      REVIEW OF SYSTEMS: ROS    Positive for: Endocrine, Eyes   Negative for: Constitutional, Gastrointestinal, Neurological, Skin, Genitourinary, Musculoskeletal, HENT, Cardiovascular, Respiratory, Psychiatric, Allergic/Imm, Heme/Lymph   Last edited by Roselee Nova D, COT on 06/24/2019  9:19 AM. (History)       ALLERGIES Allergies  Allergen Reactions  . Statins Other (See Comments)    No energy, fatigue.     PAST MEDICAL  HISTORY Past Medical History:  Diagnosis Date  . Atrial fibrillation [facility])    Maze procedure September, 2010, amiodarone and Coumadin stopped eventually January, 2011  . Coronary artery disease   . Ejection fraction    EF 60%, echo, September, 2010  . Endometrial ca [facility]) 09/25/2010   June, 2012, radiation and chemotherapy, Dr. Tressie Stalker  . Fluid overload   . Hiatal hernia   . HTN (hypertension)   . Hx of CABG    2010, Dr.Van Trigt, Maze procedure, ligation of left atrial appendage, Coumadin stopped January, 2011  . Hyperlipidemia   . Noninsulin dependent diabetes mellitus   . Osteoarthritis    Severe  . Renal insufficiency    Mild with higher dose diuretic  . Shortness of breath   . Statin intolerance    Is   Past Surgical History:  Procedure Laterality Date  . ABDOMINAL HYSTERECTOMY  07/2010   complete, endometrial cancer  . APPENDECTOMY  60 yrs ago  . BREAST MASS EXCISION     left breast 1985  . CARDIAC CATHETERIZATION    . CATARACT EXTRACTION Bilateral 2006   Dr. Iona Hansen  . choleycystectomy  1998  . COLONOSCOPY     several in [place], last one by Dr. Rowe Pavy, polyps once in [place]. records requested.  . COLONOSCOPY  06/2010   Dr. Rodrigo Ran good. Moderate diverticulosis in left colon. Inflammation found in descending colon, scope could not be advanced forward.  ACBE-->sigmoid diverticulosis no worrisome findings.   . CORONARY ARTERY BYPASS GRAFT  12/21/08   [place] Gum; LIMA LAD, SVG posterior descending, SVG OM, maze procedure, ligation left atrial appendage and MAZE procedure for A.fib  . ESOPHAGOGASTRODUODENOSCOPY  03/08/2012   XIP:[id number] ring was found at the gastroesophageal junction/Nodular gastritis (inflammation)/duodenal mucosa showed no abnormalities   . PORT-A-CATH REMOVAL  01/23/2012   Procedure: REMOVAL PORT-A-CATH;  Surgeon: Jamesetta So, MD;  Location: [facility];  Service: General;  Laterality: N/A;  Minor Room  . PORTACATH PLACEMENT    . TONSILECTOMY,  ADENOIDECTOMY, BILATERAL MYRINGOTOMY AND TUBES    . TUMOR REMOVAL     from throat  benign    FAMILY HISTORY Family History  Problem Relation Age of Onset  . Hypertension Mother   . Breast cancer Mother   . Heart attack Mother        deceased, age 47  . Diabetes Sister        deceased - 30, renal failure,   . Colon cancer Neg Hx   . Ovarian cancer Neg Hx   . Lung cancer Neg Hx   . Liver disease Neg Hx     SOCIAL HISTORY Social History   Tobacco Use  . Smoking status: Never Smoker  . Smokeless tobacco: Never Used  Substance Use Topics  . Alcohol use: No    Alcohol/week: 0.0 standard drinks  .  Drug use: No         OPHTHALMIC EXAM:  Base Eye Exam    Visual Acuity (Snellen - Linear)      Right Left   Dist cc 20/40 20/40 -2   Dist ph cc 20/25 20/40 +1   Correction: Glasses       Tonometry (Tonopen, 9:30 AM)      Right Left   Pressure 15 13       Pupils      Dark Light Shape React APD   Right 3 2 Round Brisk None   Left 3 2 Round Brisk None       Visual Fields (Counting fingers)      Left Right    Full Full       Extraocular Movement      Right Left    Full, Ortho Full, Ortho       Neuro/Psych    Oriented x3: Yes   Mood/Affect: Normal       Dilation    Both eyes: 1.0% Mydriacyl, 2.5% Phenylephrine @ 9:30 AM        Slit Lamp and Fundus Exam    Slit Lamp Exam      Right Left   Lids/Lashes Dermatochalasis - upper lid Dermatochalasis - upper lid   Conjunctiva/Sclera White and quiet White and quiet   Cornea 1+ Punctate epithelial erosions, mild EBMD 1+ Punctate epithelial erosions, mild EBMD   Anterior Chamber Deep and quiet Deep and quiet   Iris Round and dilated, No NVI Round and dilated, No NVI   Lens PC IOL in good position, 2+ Posterior capsular opacification PC IOL in good position, mild anterior capsular phimosis, trace, non-central Posterior capsular opacification   Vitreous Vitreous syneresis, Posterior vitreous detachment Vitreous  syneresis, Posterior vitreous detachment       Fundus Exam      Right Left   Disc Tilted disc, mild Pallor, +PPP Compact, mild Pallor, mildly Tilted disc   C/D Ratio 0.3 0.1   Macula Flat, Blunted foveal reflex, cluster of Microaneurysms just inferior to fovea Flat, Blunted foveal reflex, mild RPE mottling and clumping, scattered Microaneurysms   Vessels Vascular attenuation, Tortuous Vascular attenuation, Tortuous   Periphery Attached, scattered MA and peripheral drusen, ?irregular nevus at 1000 Attached, rare MA, scattered peripheral drusen        Refraction    Wearing Rx      Sphere Cylinder Axis Add   Right -1.50 +0.75 023 +2.75   Left [place] +0.50 105 +2.75          IMAGING AND PROCEDURES  Imaging and Procedures for _0 @  OCT, Retina - OU - Both Eyes       Right Eye Quality was good. Central Foveal Thickness: 263. Progression has no prior data. Findings include abnormal foveal contour, intraretinal fluid, no SRF.   Left Eye Quality was good. Central Foveal Thickness: 225. Progression has no prior data. Findings include normal foveal contour, intraretinal fluid, no SRF (Mild cystic changes).   Notes *Images captured and stored on drive  Diagnosis / Impression:  OD: Abnormal foveal profile, +DME/IRF; no SRF OS: NFP; +cystic changes / DME; no SRF  Clinical management:  See below  Abbreviations: NFP - Normal foveal profile. CME - cystoid macular edema. PED - pigment epithelial detachment. IRF - intraretinal fluid. SRF - subretinal fluid. EZ - ellipsoid zone. ERM - epiretinal membrane. ORA - outer retinal atrophy. ORT - outer retinal tubulation. SRHM - subretinal hyper-reflective material  ASSESSMENT/PLAN:    ICD-10-CM   1. Moderate nonproliferative diabetic retinopathy of both eyes with macular edema associated with type 2 diabetes mellitus ([place])  G01.7494   2. Retinal edema  H35.81 OCT, Retina - OU - Both Eyes  3. Essential hypertension   I10   4. Hypertensive retinopathy of both eyes  H35.033   5. Pseudophakia of both eyes  Z96.1   6. Choroidal nevus, right eye  D31.31     1,2. Moderate non-proliferative diabetic retinopathy, OU  - The incidence, risk factors for progression, natural history and treatment options for diabetic retinopathy  were discussed with patient.    - The need for close monitoring of blood glucose, blood pressure, and serum lipids, avoiding cigarette or any type of tobacco, and the need for long term follow up was also discussed with patient.  - pt reports no subjective change in vision and is quite happy with vision  - BCVA 20/25 OD and 20/40 OS  - exam shows scattered MA/IRH OU (OD > OS) -- OD with perifoveal cluster of MA and edema  - OCT shows mild cystic changes / diabetic macular edema, both eyes   - The natural history, pathology, and characteristics of diabetic macular edema discussed with patient.  A generalized discussion of the major clinical trials concerning treatment of diabetic macular edema (ETDRS, DCT, SCORE, RISE / RIDE, and ongoing DRCR net studies) was completed.  This discussion included mention of the various approaches to treating diabetic macular edema (observation, laser photocoagulation, anti-VEGF injections with lucentis / Avastin / Eylea, steroid injections with Kenalog / Ozurdex, and intraocular surgery with vitrectomy).  The goal hemoglobin A1C of 6-7 was discussed, as well as importance of smoking cessation and hypertension control.  Need for ongoing treatment and monitoring were specifically discussed with reference to chronic nature of diabetic macular edema.  - recommend close monitoring for now  - f/u 4-6 weeks, DFE, OCT, FA (optos, transit OD), possible IVA vs focal laser OD?  3,4. Hypertensive retinopathy OU  - discussed importance of tight BP control  - monitor  5. Pseudophakia OU  - s/p CE/IOL OU (Dr. Iona Hansen, 2006)  - IOLs in good position, doing well  -  monitor  6. Choroidal Nevus OD  - flat, irregular pigmented lesion at 1000 OD  - +overlying drusen; no SRF or orange pigment  - monitor   Ophthalmic Meds Ordered this visit:  No orders of the defined types were placed in this encounter.      Return for f/u 4-6 weeks, NPDR OU, DFE, OCT, FA (optos, transit OD).  There are no Patient Instructions on file for this visit.   Explained the diagnoses, plan, and follow up with the patient and they expressed understanding.  Patient expressed understanding of the importance of proper follow up care.   This document serves as a record of services personally performed by Gardiner Sleeper, MD, PhD. It was created on their behalf by Ernest Mallick, OA, an ophthalmic assistant. The creation of this record is the provider's dictation and/or activities during the visit.    Electronically signed by: Ernest Mallick, OA 03.15.2021 1:14 PM   Gardiner Sleeper, M.D., Ph.D. Diseases & Surgery of the Retina and Vitreous [facility]   I have reviewed the above documentation for accuracy and completeness, and I agree with the above. Gardiner Sleeper, M.D., Ph.D. 06/24/19 1:14 PM   Abbreviations: M myopia (nearsighted); A astigmatism; H hyperopia (farsighted); P presbyopia;  Mrx spectacle prescription;  CTL contact lenses; OD right eye; OS left eye; OU both eyes  XT exotropia; ET esotropia; PEK punctate epithelial keratitis; PEE punctate epithelial erosions; DES dry eye syndrome; MGD meibomian gland dysfunction; ATs artificial tears; PFAT's preservative free artificial tears; [place] nuclear sclerotic cataract; PSC posterior subcapsular cataract; ERM epi-retinal membrane; PVD posterior vitreous detachment; RD retinal detachment; DM diabetes mellitus; DR diabetic retinopathy; NPDR non-proliferative diabetic retinopathy; PDR proliferative diabetic retinopathy; CSME clinically significant macular edema; DME diabetic macular edema; dbh dot blot  hemorrhages; CWS cotton wool spot; POAG primary open angle glaucoma; C/D cup-to-disc ratio; HVF humphrey visual field; GVF goldmann visual field; OCT optical coherence tomography; IOP intraocular pressure; BRVO Branch retinal vein occlusion; CRVO central retinal vein occlusion; CRAO central retinal artery occlusion; BRAO branch retinal artery occlusion; RT retinal tear; SB scleral buckle; PPV pars plana vitrectomy; VH Vitreous hemorrhage; PRP panretinal laser photocoagulation; IVK intravitreal kenalog; VMT vitreomacular traction; MH Macular hole;  NVD neovascularization of the disc; NVE neovascularization elsewhere; AREDS age related eye disease study; ARMD age related macular degeneration; POAG primary open angle glaucoma; EBMD epithelial/anterior basement membrane dystrophy; ACIOL anterior chamber intraocular lens; IOL intraocular lens; PCIOL posterior chamber intraocular lens; Phaco/IOL phacoemulsification with intraocular lens placement; [place] photorefractive keratectomy; LASIK laser assisted in situ keratomileusis; HTN hypertension; DM diabetes mellitus; COPD chronic obstructive pulmonary disease

## 2019-06-24 ENCOUNTER — Encounter (INDEPENDENT_AMBULATORY_CARE_PROVIDER_SITE_OTHER): Payer: Self-pay | Admitting: Ophthalmology

## 2019-06-24 ENCOUNTER — Other Ambulatory Visit: Payer: Self-pay

## 2019-06-24 ENCOUNTER — Ambulatory Visit (INDEPENDENT_AMBULATORY_CARE_PROVIDER_SITE_OTHER): Payer: Medicare Other | Admitting: Ophthalmology

## 2019-06-24 DIAGNOSIS — H35033 Hypertensive retinopathy, bilateral: Secondary | ICD-10-CM | POA: Diagnosis not present

## 2019-06-24 DIAGNOSIS — H3581 Retinal edema: Secondary | ICD-10-CM

## 2019-06-24 DIAGNOSIS — I10 Essential (primary) hypertension: Secondary | ICD-10-CM

## 2019-06-24 DIAGNOSIS — E113313 Type 2 diabetes mellitus with moderate nonproliferative diabetic retinopathy with macular edema, bilateral: Secondary | ICD-10-CM

## 2019-06-24 DIAGNOSIS — D3131 Benign neoplasm of right choroid: Secondary | ICD-10-CM

## 2019-06-24 DIAGNOSIS — Z961 Presence of intraocular lens: Secondary | ICD-10-CM

## 2019-06-30 DIAGNOSIS — E119 Type 2 diabetes mellitus without complications: Secondary | ICD-10-CM | POA: Diagnosis not present

## 2019-06-30 DIAGNOSIS — I10 Essential (primary) hypertension: Secondary | ICD-10-CM | POA: Diagnosis not present

## 2019-06-30 DIAGNOSIS — I480 Paroxysmal atrial fibrillation: Secondary | ICD-10-CM | POA: Diagnosis not present

## 2019-06-30 DIAGNOSIS — E782 Mixed hyperlipidemia: Secondary | ICD-10-CM | POA: Diagnosis not present

## 2019-07-15 DIAGNOSIS — M810 Age-related osteoporosis without current pathological fracture: Secondary | ICD-10-CM | POA: Diagnosis not present

## 2019-07-30 ENCOUNTER — Other Ambulatory Visit: Payer: Self-pay | Admitting: Cardiovascular Disease

## 2019-08-03 NOTE — Progress Notes (Signed)
[facility] Note  08/05/2019     CHIEF COMPLAINT Patient presents for Retina Follow Up   HISTORY OF PRESENT ILLNESS: [name] is a 86 y.o. female who presents to the clinic today for:   HPI    Retina Follow Up    Patient presents with  Diabetic Retinopathy.  In both eyes.  This started weeks ago.  Severity is moderate.  Duration of weeks.  Since onset it is stable.  I, the attending physician,  performed the HPI with the patient and updated documentation appropriately.          Comments    A1C: 7.2 Bs: 123 yesterday Pt states her vision is stable in both eyes.  Patient denies eye pain or discomfort and denies any new or worsening floaters or fol OU.        Last edited by Bernarda Caffey, MD on 08/05/2019 12:36 PM. (History)    Pt states she feels like her vision is doing well and is not having any difficulty seeing the thing she needs to see.  Referring physician: Neale Burly, MD [place],  [place] [address]  HISTORICAL INFORMATION:   Selected notes from the MEDICAL RECORD NUMBER Referred by Dr. Leticia Clas for concern of NPDR with mac edema OU LEE: 12.29.20 [facility]) [BCVA: OD: 20/30- OS: 20/40-] Ocular Hx-pseduo OU Iona Hansen, '06), PC fibrosis, PCO, PVD, asteroid OU, nevus OD, drusen OU, HTN ret OU PMH-   CURRENT MEDICATIONS: No current outpatient medications on file. (Ophthalmic Drugs)   No current facility-administered medications for this visit. (Ophthalmic Drugs)   Current Outpatient Medications (Other)  Medication Sig  . alendronate (FOSAMAX) 70 MG tablet Take 70 mg by mouth once a week. Take with a full glass of water on an empty stomach.  . Calcium Carbonate (CALCIUM 500 PO) Take 500 mg by mouth 2 (two) times daily.  . Cholecalciferol (VITAMIN D PO) Take 400 Units by mouth daily.   Marland Kitchen denosumab (PROLIA) 60 MG/ML SOSY injection Inject 60 mg into the skin every 6 (six) months.  . diltiazem (CARDIZEM CD) 240  MG 24 hr capsule Take 240 mg by mouth daily.  . Diphenhydramine-APAP, sleep, (TYLENOL PM EXTRA STRENGTH PO) Take 1 tablet by mouth at bedtime as needed. Sleep  . furosemide (LASIX) 20 MG tablet Take 1 tablet by mouth daily.  Marland Kitchen glipiZIDE (GLUCOTROL) 5 MG tablet Take 5 mg by mouth daily.   . Glucosamine 500 MG CAPS Take 500 mg by mouth 2 (two) times daily.    Marland Kitchen HYDROcodone-acetaminophen (NORCO/VICODIN) 5-325 MG per tablet Take 1 tablet by mouth every 8 (eight) hours as needed for moderate pain.  . magnesium oxide (MAG-OX) 400 MG tablet Take 400 mg by mouth daily.  . metFORMIN (GLUCOPHAGE) 500 MG tablet Take 500 mg by mouth at bedtime. Once daily at bedtime  . metoprolol succinate (TOPROL-XL) 50 MG 24 hr tablet TAKE 1 AND 1/2 TABLETS BY MOUTH TWICE DAILY  . Multiple Vitamin (MULTIVITAMIN) tablet Take 1 tablet by mouth daily.  . Omega-3 Fatty Acids (FISH OIL) 1200 MG CAPS Take 1,200 mg by mouth 2 (two) times daily.  . sodium bicarbonate 650 MG tablet Take 650 mg by mouth 3 (three) times daily.  Marland Kitchen warfarin (COUMADIN) 5 MG tablet Take 5 mg by mouth daily. MANAGED BY PMD   No current facility-administered medications for this visit. (Other)      REVIEW OF SYSTEMS: ROS  Positive for: Endocrine, Eyes   Negative for: Constitutional, Gastrointestinal, Neurological, Skin, Genitourinary, Musculoskeletal, HENT, Cardiovascular, Respiratory, Psychiatric, Allergic/Imm, Heme/Lymph   Last edited by Doneen Poisson on 08/05/2019  8:54 AM. (History)       ALLERGIES Allergies  Allergen Reactions  . Statins Other (See Comments)    No energy, fatigue.     PAST MEDICAL HISTORY Past Medical History:  Diagnosis Date  . Atrial fibrillation [facility])    Maze procedure September, 2010, amiodarone and Coumadin stopped eventually January, 2011  . Coronary artery disease   . Ejection fraction    EF 60%, echo, September, 2010  . Endometrial ca [facility]) 09/25/2010   June, 2012, radiation and chemotherapy, Dr.  Tressie Stalker  . Fluid overload   . Hiatal hernia   . HTN (hypertension)   . Hx of CABG    2010, Dr.Van Trigt, Maze procedure, ligation of left atrial appendage, Coumadin stopped January, 2011  . Hyperlipidemia   . Noninsulin dependent diabetes mellitus   . Osteoarthritis    Severe  . Renal insufficiency    Mild with higher dose diuretic  . Shortness of breath   . Statin intolerance    Is   Past Surgical History:  Procedure Laterality Date  . ABDOMINAL HYSTERECTOMY  07/2010   complete, endometrial cancer  . APPENDECTOMY  60 yrs ago  . BREAST MASS EXCISION     left breast 1985  . CARDIAC CATHETERIZATION    . CATARACT EXTRACTION Bilateral 2006   Dr. Iona Hansen  . choleycystectomy  1998  . COLONOSCOPY     several in [place], last one by Dr. Rowe Pavy, polyps once in [place]. records requested.  . COLONOSCOPY  06/2010   Dr. Rodrigo Ran good. Moderate diverticulosis in left colon. Inflammation found in descending colon, scope could not be advanced forward.  ACBE-->sigmoid diverticulosis no worrisome findings.   . CORONARY ARTERY BYPASS GRAFT  12/21/08   Prescott Gum; LIMA LAD, SVG posterior descending, SVG OM, maze procedure, ligation left atrial appendage and MAZE procedure for A.fib  . ESOPHAGOGASTRODUODENOSCOPY  03/08/2012   [id number] ring was found at the gastroesophageal junction/Nodular gastritis (inflammation)/duodenal mucosa showed no abnormalities   . PORT-A-CATH REMOVAL  01/23/2012   Procedure: REMOVAL PORT-A-CATH;  Surgeon: Jamesetta So, MD;  Location: [facility];  Service: General;  Laterality: N/A;  Minor Room  . PORTACATH PLACEMENT    . TONSILECTOMY, ADENOIDECTOMY, BILATERAL MYRINGOTOMY AND TUBES    . TUMOR REMOVAL     from throat  benign    FAMILY HISTORY Family History  Problem Relation Age of Onset  . Hypertension Mother   . Breast cancer Mother   . Heart attack Mother        deceased, age 2  . Diabetes Sister        deceased - 69, renal failure,   . Colon cancer Neg  Hx   . Ovarian cancer Neg Hx   . Lung cancer Neg Hx   . Liver disease Neg Hx     SOCIAL HISTORY Social History   Tobacco Use  . Smoking status: Never Smoker  . Smokeless tobacco: Never Used  Substance Use Topics  . Alcohol use: No    Alcohol/week: 0.0 standard drinks  . Drug use: No         OPHTHALMIC EXAM:  Base Eye Exam    Visual Acuity (Snellen - Linear)      Right Left   Dist cc 20/40 +2 20/40 -2   Dist ph cc 20/30  NI   Correction: Glasses       Tonometry (Tonopen, 8:57 AM)      Right Left   Pressure 14 15       Pupils      Dark Light Shape React APD   Right 4 3 Round Brisk 0   Left 4 3 Round Brisk 0       Visual Fields      Left Right    Full Full       Extraocular Movement      Right Left    Full Full       Neuro/Psych    Oriented x3: Yes   Mood/Affect: Normal       Dilation    Both eyes: 1.0% Mydriacyl, 2.5% Phenylephrine @ 8:57 AM        Slit Lamp and Fundus Exam    Slit Lamp Exam      Right Left   Lids/Lashes Dermatochalasis - upper lid Dermatochalasis - upper lid   Conjunctiva/Sclera White and quiet White and quiet   Cornea 1+ Punctate epithelial erosions, mild EBMD 1+ Punctate epithelial erosions, mild EBMD   Anterior Chamber Deep and quiet Deep and quiet   Iris Round and dilated, No NVI Round and dilated, No NVI   Lens PC IOL in good position, 2+ Posterior capsular opacification non central PC IOL in good position, mild anterior capsular phimosis, trace, non-central Posterior capsular opacification   Vitreous Vitreous syneresis, Posterior vitreous detachment Vitreous syneresis, Posterior vitreous detachment       Fundus Exam      Right Left   Disc Tilted disc, mild Pallor, +PPP Compact, mild Pallor, mildly Tilted disc   C/D Ratio 0.3 0.1   Macula Flat, Blunted foveal reflex, cluster of Microaneurysms just inferior to fovea--persistent Flat, Blunted foveal reflex, mild RPE mottling and clumping, scattered Microaneurysms, mild  cluster inferior to fovea   Vessels Vascular attenuation, Tortuous Vascular attenuation, Tortuous   Periphery Attached, scattered MA/DBH and peripheral drusen, ?irregular nevus at 1000 Attached, rare MA/DBH, scattered peripheral drusen        Refraction    Wearing Rx      Sphere Cylinder Axis Add   Right -1.50 +0.75 023 +2.75   Left [place] +0.50 105 +2.75          IMAGING AND PROCEDURES  Imaging and Procedures for _0 @  OCT, Retina - OU - Both Eyes       Right Eye Quality was good. Central Foveal Thickness: 263. Progression has improved. Findings include abnormal foveal contour, intraretinal fluid, no SRF (Mild interval improvement in IRF-temporal fovea).   Left Eye Quality was good. Central Foveal Thickness: 230. Progression has been stable. Findings include normal foveal contour, intraretinal fluid, no SRF (Persistent Mild cystic changes).   Notes *Images captured and stored on drive  Diagnosis / Impression:  OD: Mild interval improvement in IRF temporal fovea OS: NFP; +cystic changes / DME; no SRF  Clinical management:  See below  Abbreviations: NFP - Normal foveal profile. CME - cystoid macular edema. PED - pigment epithelial detachment. IRF - intraretinal fluid. SRF - subretinal fluid. EZ - ellipsoid zone. ERM - epiretinal membrane. ORA - outer retinal atrophy. ORT - outer retinal tubulation. SRHM - subretinal hyper-reflective material        Fluorescein Angiography Optos (Transit OS)       Right Eye   Progression has no prior data. Early phase findings include vascular perfusion defect, microaneurysm, blockage, leakage. Mid/Late phase  findings include blockage.   Left Eye   Progression has no prior data. Early phase findings include blockage, microaneurysm, vascular perfusion defect, staining, leakage. Mid/Late phase findings include blockage, staining, microaneurysm, leakage.   Notes **Images stored on drive**  Impression: Moderate NPDR OU  Late  leaking MA  Scattered perfusion defect OU No NV OU                  ASSESSMENT/PLAN:    ICD-10-CM   1. Moderate nonproliferative diabetic retinopathy of both eyes with macular edema associated with type 2 diabetes mellitus ([place])  M08.6761   2. Retinal edema  H35.81 OCT, Retina - OU - Both Eyes  3. Essential hypertension  I10   4. Hypertensive retinopathy of both eyes  H35.033 Fluorescein Angiography Optos (Transit OS)  5. Pseudophakia of both eyes  Z96.1   6. Choroidal nevus, right eye  D31.31     1,2. Moderate non-proliferative diabetic retinopathy, OU  - pt reports no subjective change in vision and is quite happy with vision  - BCVA 20/30 OD and 20/40 OS  - exam shows scattered MA/IRH OU (OD > OS) -- OD with perifoveal cluster of MA and edema  - OCT shows mild cystic changes / diabetic macular edema, both eyes--improved on OCT OD on 04.30.21  - FA 4.30.21 shows late leaking MA OU, mild vasc perfusion defects OU, no NV  - discussed findings potential treatments for DME  - pt wishes to observe and try to improve DME with improved BP and BG  - f/u 6-8 weeks, DFE, OCT,possible IVA vs focal laser OD?  3,4. Hypertensive retinopathy OU  - discussed importance of tight BP control  - monitor  5. Pseudophakia OU  - s/p CE/IOL OU (Dr. Iona Hansen, 2006)  - IOLs in good position, doing well  - monitor  6. Choroidal Nevus OD  - flat, irregular pigmented lesion at 1000 OD  - +overlying drusen; no SRF or orange pigment  - monitor   Ophthalmic Meds Ordered this visit:  No orders of the defined types were placed in this encounter.      Return in about 6 weeks (around 09/16/2019) for 6 weeks NPDR OU, DFE, OCT.  There are no Patient Instructions on file for this visit.   Explained the diagnoses, plan, and follow up with the patient and they expressed understanding.  Patient expressed understanding of the importance of proper follow up care.   This document serves as a record  of services personally performed by Gardiner Sleeper, MD, PhD. It was created on their behalf by Ernest Mallick, OA, an ophthalmic assistant. The creation of this record is the provider's dictation and/or activities during the visit.    Electronically signed by: Ernest Mallick, OA 04.28.2021 12:41 PM   Gardiner Sleeper, M.D., Ph.D. Diseases & Surgery of the Retina and Vitreous [facility]  I have reviewed the above documentation for accuracy and completeness, and I agree with the above. Gardiner Sleeper, M.D., Ph.D. 08/05/19 12:41 PM    Abbreviations: M myopia (nearsighted); A astigmatism; H hyperopia (farsighted); P presbyopia; Mrx spectacle prescription;  CTL contact lenses; OD right eye; OS left eye; OU both eyes  XT exotropia; ET esotropia; PEK punctate epithelial keratitis; PEE punctate epithelial erosions; DES dry eye syndrome; MGD meibomian gland dysfunction; ATs artificial tears; PFAT's preservative free artificial tears; [place] nuclear sclerotic cataract; PSC posterior subcapsular cataract; ERM epi-retinal membrane; PVD posterior vitreous detachment; RD retinal detachment; DM  diabetes mellitus; DR diabetic retinopathy; NPDR non-proliferative diabetic retinopathy; PDR proliferative diabetic retinopathy; CSME clinically significant macular edema; DME diabetic macular edema; dbh dot blot hemorrhages; CWS cotton wool spot; POAG primary open angle glaucoma; C/D cup-to-disc ratio; HVF humphrey visual field; GVF goldmann visual field; OCT optical coherence tomography; IOP intraocular pressure; BRVO Branch retinal vein occlusion; CRVO central retinal vein occlusion; CRAO central retinal artery occlusion; BRAO branch retinal artery occlusion; RT retinal tear; SB scleral buckle; PPV pars plana vitrectomy; VH Vitreous hemorrhage; PRP panretinal laser photocoagulation; IVK intravitreal kenalog; VMT vitreomacular traction; MH Macular hole;  NVD neovascularization of the disc; NVE  neovascularization elsewhere; AREDS age related eye disease study; ARMD age related macular degeneration; POAG primary open angle glaucoma; EBMD epithelial/anterior basement membrane dystrophy; ACIOL anterior chamber intraocular lens; IOL intraocular lens; PCIOL posterior chamber intraocular lens; Phaco/IOL phacoemulsification with intraocular lens placement; [place] photorefractive keratectomy; LASIK laser assisted in situ keratomileusis; HTN hypertension; DM diabetes mellitus; COPD chronic obstructive pulmonary disease

## 2019-08-05 ENCOUNTER — Encounter (INDEPENDENT_AMBULATORY_CARE_PROVIDER_SITE_OTHER): Payer: Self-pay | Admitting: Ophthalmology

## 2019-08-05 ENCOUNTER — Ambulatory Visit (INDEPENDENT_AMBULATORY_CARE_PROVIDER_SITE_OTHER): Payer: Medicare Other | Admitting: Ophthalmology

## 2019-08-05 DIAGNOSIS — H35033 Hypertensive retinopathy, bilateral: Secondary | ICD-10-CM

## 2019-08-05 DIAGNOSIS — D3131 Benign neoplasm of right choroid: Secondary | ICD-10-CM

## 2019-08-05 DIAGNOSIS — E113313 Type 2 diabetes mellitus with moderate nonproliferative diabetic retinopathy with macular edema, bilateral: Secondary | ICD-10-CM

## 2019-08-05 DIAGNOSIS — Z961 Presence of intraocular lens: Secondary | ICD-10-CM

## 2019-08-05 DIAGNOSIS — H3581 Retinal edema: Secondary | ICD-10-CM

## 2019-08-05 DIAGNOSIS — I10 Essential (primary) hypertension: Secondary | ICD-10-CM | POA: Diagnosis not present

## 2019-08-23 DIAGNOSIS — I480 Paroxysmal atrial fibrillation: Secondary | ICD-10-CM | POA: Diagnosis not present

## 2019-08-24 ENCOUNTER — Encounter (HOSPITAL_COMMUNITY): Payer: Self-pay | Admitting: Emergency Medicine

## 2019-08-24 ENCOUNTER — Other Ambulatory Visit: Payer: Self-pay

## 2019-08-24 ENCOUNTER — Inpatient Hospital Stay (HOSPITAL_COMMUNITY)
Admission: EM | Admit: 2019-08-24 | Discharge: 2019-08-27 | DRG: 602 | Disposition: A | Discharge status: Home / Self Care | Payer: Medicare Other | Attending: Internal Medicine | Admitting: Internal Medicine

## 2019-08-24 ENCOUNTER — Emergency Department (HOSPITAL_COMMUNITY): Payer: Medicare Other

## 2019-08-24 DIAGNOSIS — J189 Pneumonia, unspecified organism: Secondary | ICD-10-CM | POA: Diagnosis not present

## 2019-08-24 DIAGNOSIS — K761 Chronic passive congestion of liver: Secondary | ICD-10-CM | POA: Diagnosis not present

## 2019-08-24 DIAGNOSIS — I4821 Permanent atrial fibrillation: Secondary | ICD-10-CM | POA: Diagnosis present

## 2019-08-24 DIAGNOSIS — I272 Pulmonary hypertension, unspecified: Secondary | ICD-10-CM | POA: Diagnosis present

## 2019-08-24 DIAGNOSIS — I5031 Acute diastolic (congestive) heart failure: Secondary | ICD-10-CM | POA: Diagnosis not present

## 2019-08-24 DIAGNOSIS — R7989 Other specified abnormal findings of blood chemistry: Secondary | ICD-10-CM | POA: Diagnosis not present

## 2019-08-24 DIAGNOSIS — Z833 Family history of diabetes mellitus: Secondary | ICD-10-CM | POA: Diagnosis not present

## 2019-08-24 DIAGNOSIS — R7401 Elevation of levels of liver transaminase levels: Secondary | ICD-10-CM | POA: Diagnosis not present

## 2019-08-24 DIAGNOSIS — I4891 Unspecified atrial fibrillation: Secondary | ICD-10-CM | POA: Diagnosis not present

## 2019-08-24 DIAGNOSIS — E785 Hyperlipidemia, unspecified: Secondary | ICD-10-CM | POA: Diagnosis not present

## 2019-08-24 DIAGNOSIS — I2510 Atherosclerotic heart disease of native coronary artery without angina pectoris: Secondary | ICD-10-CM | POA: Diagnosis present

## 2019-08-24 DIAGNOSIS — Z888 Allergy status to other drugs, medicaments and biological substances status: Secondary | ICD-10-CM

## 2019-08-24 DIAGNOSIS — R600 Localized edema: Secondary | ICD-10-CM | POA: Diagnosis not present

## 2019-08-24 DIAGNOSIS — Z7983 Long term (current) use of bisphosphonates: Secondary | ICD-10-CM

## 2019-08-24 DIAGNOSIS — Z452 Encounter for adjustment and management of vascular access device: Secondary | ICD-10-CM | POA: Diagnosis not present

## 2019-08-24 DIAGNOSIS — M1990 Unspecified osteoarthritis, unspecified site: Secondary | ICD-10-CM | POA: Diagnosis not present

## 2019-08-24 DIAGNOSIS — Z7901 Long term (current) use of anticoagulants: Secondary | ICD-10-CM | POA: Diagnosis not present

## 2019-08-24 DIAGNOSIS — R7881 Bacteremia: Secondary | ICD-10-CM | POA: Diagnosis present

## 2019-08-24 DIAGNOSIS — E1165 Type 2 diabetes mellitus with hyperglycemia: Secondary | ICD-10-CM | POA: Diagnosis not present

## 2019-08-24 DIAGNOSIS — Z20822 Contact with and (suspected) exposure to covid-19: Secondary | ICD-10-CM | POA: Diagnosis not present

## 2019-08-24 DIAGNOSIS — B954 Other streptococcus as the cause of diseases classified elsewhere: Secondary | ICD-10-CM | POA: Diagnosis present

## 2019-08-24 DIAGNOSIS — B955 Unspecified streptococcus as the cause of diseases classified elsewhere: Secondary | ICD-10-CM

## 2019-08-24 DIAGNOSIS — Z8542 Personal history of malignant neoplasm of other parts of uterus: Secondary | ICD-10-CM

## 2019-08-24 DIAGNOSIS — Z8249 Family history of ischemic heart disease and other diseases of the circulatory system: Secondary | ICD-10-CM | POA: Diagnosis not present

## 2019-08-24 DIAGNOSIS — Z79899 Other long term (current) drug therapy: Secondary | ICD-10-CM | POA: Diagnosis not present

## 2019-08-24 DIAGNOSIS — Z7984 Long term (current) use of oral hypoglycemic drugs: Secondary | ICD-10-CM | POA: Diagnosis not present

## 2019-08-24 DIAGNOSIS — R059 Cough, unspecified: Secondary | ICD-10-CM

## 2019-08-24 DIAGNOSIS — J90 Pleural effusion, not elsewhere classified: Secondary | ICD-10-CM | POA: Diagnosis not present

## 2019-08-24 DIAGNOSIS — K760 Fatty (change of) liver, not elsewhere classified: Secondary | ICD-10-CM | POA: Diagnosis present

## 2019-08-24 DIAGNOSIS — L03115 Cellulitis of right lower limb: Secondary | ICD-10-CM | POA: Diagnosis not present

## 2019-08-24 DIAGNOSIS — R05 Cough: Secondary | ICD-10-CM | POA: Diagnosis not present

## 2019-08-24 DIAGNOSIS — I10 Essential (primary) hypertension: Secondary | ICD-10-CM | POA: Diagnosis not present

## 2019-08-24 DIAGNOSIS — I110 Hypertensive heart disease with heart failure: Secondary | ICD-10-CM | POA: Diagnosis present

## 2019-08-24 DIAGNOSIS — Z951 Presence of aortocoronary bypass graft: Secondary | ICD-10-CM

## 2019-08-24 DIAGNOSIS — R509 Fever, unspecified: Secondary | ICD-10-CM | POA: Diagnosis not present

## 2019-08-24 DIAGNOSIS — R0602 Shortness of breath: Secondary | ICD-10-CM | POA: Diagnosis present

## 2019-08-24 DIAGNOSIS — I361 Nonrheumatic tricuspid (valve) insufficiency: Secondary | ICD-10-CM | POA: Diagnosis not present

## 2019-08-24 LAB — COMPREHENSIVE METABOLIC PANEL
ALT: 64 U/L — ABNORMAL HIGH (ref 0–44)
AST: 71 U/L — ABNORMAL HIGH (ref 15–41)
Albumin: 3.9 g/dL (ref 3.5–5.0)
Alkaline Phosphatase: 75 U/L (ref 38–126)
Anion gap: 12 (ref 5–15)
BUN: 30 mg/dL — ABNORMAL HIGH (ref 8–23)
CO2: 23 mmol/L (ref 22–32)
Calcium: 9 mg/dL (ref 8.9–10.3)
Chloride: 98 mmol/L (ref 98–111)
Creatinine, Ser: 1 mg/dL (ref 0.44–1.00)
GFR calc Af Amer: 59 mL/min — ABNORMAL LOW (ref >60)
GFR calc non Af Amer: 51 mL/min — ABNORMAL LOW (ref >60)
Glucose, Bld: 192 mg/dL — ABNORMAL HIGH (ref 70–99)
Potassium: 4.2 mmol/L (ref 3.5–5.1)
Sodium: 133 mmol/L — ABNORMAL LOW (ref 135–145)
Total Bilirubin: 1 mg/dL (ref 0.3–1.2)
Total Protein: 7.3 g/dL (ref 6.5–8.1)

## 2019-08-24 LAB — CBC WITH DIFFERENTIAL/PLATELET
Abs Immature Granulocytes: 0.05 10*3/uL (ref 0.00–0.07)
Basophils Absolute: 0 10*3/uL (ref 0.0–0.1)
Basophils Relative: 0 %
Eosinophils Absolute: 0 10*3/uL (ref 0.0–0.5)
Eosinophils Relative: 0 %
HCT: 34.5 % — ABNORMAL LOW (ref 36.0–46.0)
Hemoglobin: 11.2 g/dL — ABNORMAL LOW (ref 12.0–15.0)
Immature Granulocytes: 0 %
Lymphocytes Relative: 2 %
Lymphs Abs: 0.2 10*3/uL — ABNORMAL LOW (ref 0.7–4.0)
MCH: 29.8 pg (ref 26.0–34.0)
MCHC: 32.5 g/dL (ref 30.0–36.0)
MCV: 91.8 fL (ref 80.0–100.0)
Monocytes Absolute: 0.4 10*3/uL (ref 0.1–1.0)
Monocytes Relative: 3 %
Neutro Abs: 11 10*3/uL — ABNORMAL HIGH (ref 1.7–7.7)
Neutrophils Relative %: 95 %
Platelets: 169 10*3/uL (ref 150–400)
RBC: 3.76 MIL/uL — ABNORMAL LOW (ref 3.87–5.11)
RDW: 13.2 % (ref 11.5–15.5)
WBC: 11.7 10*3/uL — ABNORMAL HIGH (ref 4.0–10.5)
nRBC: 0 % (ref 0.0–0.2)

## 2019-08-24 LAB — LACTIC ACID, PLASMA
Lactic Acid, Venous: 1.5 mmol/L (ref 0.5–1.9)
Lactic Acid, Venous: 1.5 mmol/L (ref 0.5–1.9)

## 2019-08-24 LAB — SARS CORONAVIRUS 2 BY RT PCR (HOSPITAL ORDER, PERFORMED IN ~~LOC~~ HOSPITAL LAB): SARS Coronavirus 2: NEGATIVE

## 2019-08-24 LAB — PROTIME-INR
INR: 2.1 — ABNORMAL HIGH (ref 0.8–1.2)
Prothrombin Time: 22.7 seconds — ABNORMAL HIGH (ref 11.4–15.2)

## 2019-08-24 MED ORDER — SODIUM CHLORIDE 0.9 % IV BOLUS (SEPSIS)
500.0000 mL | Freq: Once | INTRAVENOUS | Status: AC
  Administered 2019-08-24: 500 mL via INTRAVENOUS

## 2019-08-24 MED ORDER — SODIUM CHLORIDE 0.9 % IV SOLN
1.0000 g | Freq: Once | INTRAVENOUS | Status: AC
  Administered 2019-08-24: 1 g via INTRAVENOUS
  Filled 2019-08-24: qty 10

## 2019-08-24 MED ORDER — ACETAMINOPHEN 325 MG PO TABS: 650.0000 mg | ORAL_TABLET | Freq: Four times a day (QID) | ORAL | Status: DC | PRN

## 2019-08-24 MED ORDER — INSULIN ASPART 100 UNIT/ML ~~LOC~~ SOLN: 0.0000 [IU] | Freq: Every day | SUBCUTANEOUS | Status: DC

## 2019-08-24 MED ORDER — SODIUM CHLORIDE 0.9 % IV SOLN
1000.0000 mL | INTRAVENOUS | Status: DC
  Administered 2019-08-24 – 2019-08-25 (×2): 1000 mL via INTRAVENOUS

## 2019-08-24 MED ORDER — INSULIN ASPART 100 UNIT/ML ~~LOC~~ SOLN
0.0000 [IU] | Freq: Three times a day (TID) | SUBCUTANEOUS | Status: DC
  Administered 2019-08-25 (×3): 5 [IU] via SUBCUTANEOUS
  Administered 2019-08-26: 3 [IU] via SUBCUTANEOUS

## 2019-08-24 MED ORDER — BENZONATATE 100 MG PO CAPS: 100.0000 mg | ORAL_CAPSULE | Freq: Three times a day (TID) | ORAL | Status: DC | PRN

## 2019-08-24 MED ORDER — AZITHROMYCIN 250 MG PO TABS
500.0000 mg | ORAL_TABLET | Freq: Once | ORAL | Status: AC
  Administered 2019-08-24: 500 mg via ORAL
  Filled 2019-08-24: qty 2

## 2019-08-24 MED ORDER — ACETAMINOPHEN 325 MG PO TABS
650.0000 mg | ORAL_TABLET | Freq: Once | ORAL | Status: AC
  Administered 2019-08-24: 650 mg via ORAL
  Filled 2019-08-24: qty 2

## 2019-08-24 NOTE — ED Provider Notes (Signed)
[facility] EMERGENCY DEPARTMENT Provider Note   CSN: [id number] Arrival date & time: 08/24/19  1841     History Chief Complaint  Patient presents with  . Cough    [name] is a 86 y.o. female.  HPI   Patient states she came to the ED today for evaluation of trouble with cough and malaise.  Patient states she started coughing today.  She then decided developed fevers and chills.  He denies any shortness of breath.  No trouble with chest pain.  She is not having any issues with vomiting or diarrhea.  Patient denies any chronic lung issues.  Patient has not had Covid 19.  She has been vaccinated fully.  Past Medical History:  Diagnosis Date  . Atrial fibrillation [facility])    Maze procedure September, 2010, amiodarone and Coumadin stopped eventually January, 2011  . Coronary artery disease   . Ejection fraction    EF 60%, echo, September, 2010  . Endometrial ca [facility]) 09/25/2010   June, 2012, radiation and chemotherapy, Dr. Tressie Stalker  . Fluid overload   . Hiatal hernia   . HTN (hypertension)   . Hx of CABG    2010, Dr.Van Trigt, Maze procedure, ligation of left atrial appendage, Coumadin stopped January, 2011  . Hyperlipidemia   . Noninsulin dependent diabetes mellitus   . Osteoarthritis    Severe  . Renal insufficiency    Mild with higher dose diuretic  . Shortness of breath   . Statin intolerance    Is    Patient Active Problem List   Diagnosis Date Noted  . Chronic anticoagulation 03/22/2013  . Mitral regurgitation 03/22/2013  . Aortic sclerosis 03/22/2013  . Pulmonary hypertension ([place]) 03/22/2013  . Gastritis, atrophic 09/15/2012  . Hiatal hernia 02/24/2012  . Abnormality of esophagus 02/24/2012  . Anemia 02/24/2012  . Statin intolerance   . Hx of CABG   . Coronary artery disease   . Fluid overload   . Renal insufficiency   . Ejection fraction   . Atrial fibrillation ([place])   . Endometrial ca ([place]) 09/25/2010  . SHORTNESS OF BREATH 03/27/2009  . DM  01/23/2009  . HYPERLIPIDEMIA 01/23/2009  . Essential hypertension 01/23/2009    Past Surgical History:  Procedure Laterality Date  . ABDOMINAL HYSTERECTOMY  07/2010   complete, endometrial cancer  . APPENDECTOMY  60 yrs ago  . BREAST MASS EXCISION     left breast 1985  . CARDIAC CATHETERIZATION    . CATARACT EXTRACTION Bilateral 2006   Dr. Iona Hansen  . choleycystectomy  1998  . COLONOSCOPY     several in [place], last one by Dr. Rowe Pavy, polyps once in [place]. records requested.  . COLONOSCOPY  06/2010   Dr. Rodrigo Ran good. Moderate diverticulosis in left colon. Inflammation found in descending colon, scope could not be advanced forward.  ACBE-->sigmoid diverticulosis no worrisome findings.   . CORONARY ARTERY BYPASS GRAFT  12/21/08   Prescott Gum; LIMA LAD, SVG posterior descending, SVG OM, maze procedure, ligation left atrial appendage and MAZE procedure for A.fib  . ESOPHAGOGASTRODUODENOSCOPY  03/08/2012   [id number] ring was found at the gastroesophageal junction/Nodular gastritis (inflammation)/duodenal mucosa showed no abnormalities   . PORT-A-CATH REMOVAL  01/23/2012   Procedure: REMOVAL PORT-A-CATH;  Surgeon: Jamesetta So, MD;  Location: [facility] ORS;  Service: General;  Laterality: N/A;  Minor Room  . PORTACATH PLACEMENT    . TONSILECTOMY, ADENOIDECTOMY, BILATERAL MYRINGOTOMY AND TUBES    . TUMOR REMOVAL     from  throat  benign     OB History   No obstetric history on file.     Family History  Problem Relation Age of Onset  . Hypertension Mother   . Breast cancer Mother   . Heart attack Mother        deceased, age 23  . Diabetes Sister        deceased - 56, renal failure,   . Colon cancer Neg Hx   . Ovarian cancer Neg Hx   . Lung cancer Neg Hx   . Liver disease Neg Hx     Social History   Tobacco Use  . Smoking status: Never Smoker  . Smokeless tobacco: Never Used  Substance Use Topics  . Alcohol use: No    Alcohol/week: 0.0 standard drinks  . Drug use: No      Home Medications Prior to Admission medications   Medication Sig Start Date End Date Taking? Authorizing Provider  alendronate (FOSAMAX) 70 MG tablet Take 70 mg by mouth once a week. Take with a full glass of water on an empty stomach.    [provider]  Calcium Carbonate (CALCIUM 500 PO) Take 500 mg by mouth 2 (two) times daily.    [provider]  Cholecalciferol (VITAMIN D PO) Take 400 Units by mouth daily.     [provider]  denosumab (PROLIA) 60 MG/ML SOSY injection Inject 60 mg into the skin every 6 (six) months.    [provider]  diltiazem (CARDIZEM CD) 240 MG 24 hr capsule Take 240 mg by mouth daily.    [provider]  Diphenhydramine-APAP, sleep, (TYLENOL PM EXTRA STRENGTH PO) Take 1 tablet by mouth at bedtime as needed. Sleep    [provider]  furosemide (LASIX) 20 MG tablet Take 1 tablet by mouth daily. 06/13/15   [provider]  glipiZIDE (GLUCOTROL) 5 MG tablet Take 5 mg by mouth daily.     [provider]  Glucosamine 500 MG CAPS Take 500 mg by mouth 2 (two) times daily.      [provider]  HYDROcodone-acetaminophen (NORCO/VICODIN) 5-325 MG per tablet Take 1 tablet by mouth every 8 (eight) hours as needed for moderate pain.    [provider]  magnesium oxide (MAG-OX) 400 MG tablet Take 400 mg by mouth daily.    [provider]  metFORMIN (GLUCOPHAGE) 500 MG tablet Take 500 mg by mouth at bedtime. Once daily at bedtime    [provider]  metoprolol succinate (TOPROL-XL) 50 MG 24 hr tablet TAKE 1 AND 1/2 TABLETS BY MOUTH TWICE DAILY 08/01/19   Herminio Commons, MD  Multiple Vitamin (MULTIVITAMIN) tablet Take 1 tablet by mouth daily.    [provider]  Omega-3 Fatty Acids (FISH OIL) 1200 MG CAPS Take 1,200 mg by mouth 2 (two) times daily.    [provider]  sodium bicarbonate 650 MG tablet Take 650 mg by mouth 3 (three) times daily.     [provider]  warfarin (COUMADIN) 5 MG tablet Take 5 mg by mouth daily. MANAGED BY PMD    [provider]    Allergies    Statins  Review of Systems   Review of Systems  All other systems reviewed and are negative.   Physical Exam Updated Vital Signs BP (!) 142/69   Pulse (!) 108   Temp (!) 101.9 F (38.8 C)   Resp (!) 22   Ht 1.549 m (5\' 1" )  Wt 75.8 kg   SpO2 96%   BMI 31.55 kg/m   Physical Exam Vitals and nursing note reviewed.  Constitutional:      Appearance: She is well-developed. She is not toxic-appearing or diaphoretic.  HENT:     Head: Normocephalic and atraumatic.     Right Ear: External ear normal.     Left Ear: External ear normal.  Eyes:     General: No scleral icterus.       Right eye: No discharge.        Left eye: No discharge.     Conjunctiva/sclera: Conjunctivae normal.  Neck:     Trachea: No tracheal deviation.  Cardiovascular:     Rate and Rhythm: Tachycardia present. Rhythm irregular.  Pulmonary:     Effort: Pulmonary effort is normal. No respiratory distress.     Breath sounds: Normal breath sounds. No stridor. No wheezing or rales.  Abdominal:     General: Bowel sounds are normal. There is no distension.     Palpations: Abdomen is soft.     Tenderness: There is no abdominal tenderness. There is no guarding or rebound.  Musculoskeletal:        General: No tenderness.     Cervical back: Neck supple.  Skin:    General: Skin is warm and dry.     Findings: No rash.  Neurological:     Mental Status: She is alert.     Cranial Nerves: No cranial nerve deficit (no facial droop, extraocular movements intact, no slurred speech).     Sensory: No sensory deficit.     Motor: No abnormal muscle tone or seizure activity.     Coordination: Coordination normal.     ED Results / Procedures / Treatments   Labs (all labs ordered are listed, but only abnormal results are displayed) Labs Reviewed  COMPREHENSIVE METABOLIC PANEL  - Abnormal; Notable for the following components:      Result Value   Sodium 133 (*)    Glucose, Bld 192 (*)    BUN 30 (*)    AST 71 (*)    ALT 64 (*)    GFR calc non Af Amer 51 (*)    GFR calc Af Amer 59 (*)    All other components within normal limits  CBC WITH DIFFERENTIAL/PLATELET - Abnormal; Notable for the following components:   WBC 11.7 (*)    RBC 3.76 (*)    Hemoglobin 11.2 (*)    HCT 34.5 (*)    Neutro Abs 11.0 (*)    Lymphs Abs 0.2 (*)    All other components within normal limits  PROTIME-INR - Abnormal; Notable for the following components:   Prothrombin Time 22.7 (*)    INR 2.1 (*)    All other components within normal limits  CULTURE, BLOOD (ROUTINE X 2)  CULTURE, BLOOD (ROUTINE X 2)  LACTIC ACID, PLASMA  LACTIC ACID, PLASMA  URINALYSIS, ROUTINE W REFLEX MICROSCOPIC     Radiology   Procedures Procedures (including critical care time)  Medications Ordered in ED Medications  acetaminophen (TYLENOL) tablet 650 mg (650 mg Oral Given 08/24/19 1906)    ED Course  I have reviewed the triage vital signs and the nursing notes.  Pertinent labs & imaging results that were available during my care of the patient were reviewed by me and considered in my medical decision making (see chart for details).  Clinical Course as of Aug 27 1701  Wed Aug 24, 2019  2241 Patient's  laboratory tests notable for slight elevation in LFTs.  INR elevated 2.1.  Leukocytosis and stable anemia   [JK]  2241 Chest x-ray suggestive of interstitial edema versus atypical viral infection   [JK]  2257 Covid test is negative.   [id number] Patient remains tachycardic but no hypotension no lactic acidosis.  No signs of sepsis.   [JK]    Clinical Course User Index [JK] Dorie Rank, MD   MDM Rules/Calculators/A&P                      Pt presented with cough, and weakness.  IN the ED noted to have fever.  CXR suggestive of CHF vs pna.  Clinical picture consistent with pna.  Covid  negative.  No signs of sepsis.  Admitted to the hospital for IV abx. Final Clinical Impression(s) / ED Diagnoses Final diagnoses:  Cough    Rx / DC Orders ED Discharge Orders    None       Dorie Rank, MD 08/28/19 1705

## 2019-08-24 NOTE — ED Triage Notes (Signed)
Pt c/o cough since noon today and states she doesn't fell well.

## 2019-08-25 ENCOUNTER — Observation Stay (HOSPITAL_COMMUNITY): Payer: Medicare Other

## 2019-08-25 ENCOUNTER — Inpatient Hospital Stay (HOSPITAL_COMMUNITY): Payer: Medicare Other

## 2019-08-25 ENCOUNTER — Encounter (HOSPITAL_COMMUNITY): Payer: Self-pay | Admitting: Internal Medicine

## 2019-08-25 DIAGNOSIS — I4821 Permanent atrial fibrillation: Secondary | ICD-10-CM | POA: Diagnosis present

## 2019-08-25 DIAGNOSIS — L03115 Cellulitis of right lower limb: Secondary | ICD-10-CM | POA: Diagnosis present

## 2019-08-25 DIAGNOSIS — R05 Cough: Secondary | ICD-10-CM | POA: Diagnosis present

## 2019-08-25 DIAGNOSIS — I4891 Unspecified atrial fibrillation: Secondary | ICD-10-CM | POA: Diagnosis not present

## 2019-08-25 DIAGNOSIS — Z7984 Long term (current) use of oral hypoglycemic drugs: Secondary | ICD-10-CM | POA: Diagnosis not present

## 2019-08-25 DIAGNOSIS — I361 Nonrheumatic tricuspid (valve) insufficiency: Secondary | ICD-10-CM | POA: Diagnosis not present

## 2019-08-25 DIAGNOSIS — R7401 Elevation of levels of liver transaminase levels: Secondary | ICD-10-CM | POA: Diagnosis not present

## 2019-08-25 DIAGNOSIS — J189 Pneumonia, unspecified organism: Secondary | ICD-10-CM | POA: Diagnosis not present

## 2019-08-25 DIAGNOSIS — B954 Other streptococcus as the cause of diseases classified elsewhere: Secondary | ICD-10-CM | POA: Diagnosis present

## 2019-08-25 DIAGNOSIS — Z79899 Other long term (current) drug therapy: Secondary | ICD-10-CM | POA: Diagnosis not present

## 2019-08-25 DIAGNOSIS — Z8542 Personal history of malignant neoplasm of other parts of uterus: Secondary | ICD-10-CM | POA: Diagnosis not present

## 2019-08-25 DIAGNOSIS — E1165 Type 2 diabetes mellitus with hyperglycemia: Secondary | ICD-10-CM | POA: Diagnosis present

## 2019-08-25 DIAGNOSIS — I2510 Atherosclerotic heart disease of native coronary artery without angina pectoris: Secondary | ICD-10-CM | POA: Diagnosis present

## 2019-08-25 DIAGNOSIS — I272 Pulmonary hypertension, unspecified: Secondary | ICD-10-CM | POA: Diagnosis present

## 2019-08-25 DIAGNOSIS — I10 Essential (primary) hypertension: Secondary | ICD-10-CM | POA: Diagnosis not present

## 2019-08-25 DIAGNOSIS — K761 Chronic passive congestion of liver: Secondary | ICD-10-CM | POA: Diagnosis present

## 2019-08-25 DIAGNOSIS — K760 Fatty (change of) liver, not elsewhere classified: Secondary | ICD-10-CM | POA: Diagnosis present

## 2019-08-25 DIAGNOSIS — Z833 Family history of diabetes mellitus: Secondary | ICD-10-CM | POA: Diagnosis not present

## 2019-08-25 DIAGNOSIS — Z951 Presence of aortocoronary bypass graft: Secondary | ICD-10-CM | POA: Diagnosis not present

## 2019-08-25 DIAGNOSIS — R7881 Bacteremia: Secondary | ICD-10-CM | POA: Diagnosis present

## 2019-08-25 DIAGNOSIS — Z888 Allergy status to other drugs, medicaments and biological substances status: Secondary | ICD-10-CM | POA: Diagnosis not present

## 2019-08-25 DIAGNOSIS — I5031 Acute diastolic (congestive) heart failure: Secondary | ICD-10-CM | POA: Diagnosis present

## 2019-08-25 DIAGNOSIS — Z7983 Long term (current) use of bisphosphonates: Secondary | ICD-10-CM | POA: Diagnosis not present

## 2019-08-25 DIAGNOSIS — Z20822 Contact with and (suspected) exposure to covid-19: Secondary | ICD-10-CM | POA: Diagnosis present

## 2019-08-25 DIAGNOSIS — M1990 Unspecified osteoarthritis, unspecified site: Secondary | ICD-10-CM | POA: Diagnosis present

## 2019-08-25 DIAGNOSIS — Z7901 Long term (current) use of anticoagulants: Secondary | ICD-10-CM | POA: Diagnosis not present

## 2019-08-25 DIAGNOSIS — Z8249 Family history of ischemic heart disease and other diseases of the circulatory system: Secondary | ICD-10-CM | POA: Diagnosis not present

## 2019-08-25 DIAGNOSIS — E785 Hyperlipidemia, unspecified: Secondary | ICD-10-CM | POA: Diagnosis present

## 2019-08-25 DIAGNOSIS — I110 Hypertensive heart disease with heart failure: Secondary | ICD-10-CM | POA: Diagnosis present

## 2019-08-25 LAB — COMPREHENSIVE METABOLIC PANEL
ALT: 88 U/L — ABNORMAL HIGH (ref 0–44)
AST: 93 U/L — ABNORMAL HIGH (ref 15–41)
Albumin: 3.3 g/dL — ABNORMAL LOW (ref 3.5–5.0)
Alkaline Phosphatase: 70 U/L (ref 38–126)
Anion gap: 12 (ref 5–15)
BUN: 31 mg/dL — ABNORMAL HIGH (ref 8–23)
CO2: 22 mmol/L (ref 22–32)
Calcium: 8.5 mg/dL — ABNORMAL LOW (ref 8.9–10.3)
Chloride: 100 mmol/L (ref 98–111)
Creatinine, Ser: 1.04 mg/dL — ABNORMAL HIGH (ref 0.44–1.00)
GFR calc Af Amer: 56 mL/min — ABNORMAL LOW (ref >60)
GFR calc non Af Amer: 49 mL/min — ABNORMAL LOW (ref >60)
Glucose, Bld: 232 mg/dL — ABNORMAL HIGH (ref 70–99)
Potassium: 4.2 mmol/L (ref 3.5–5.1)
Sodium: 134 mmol/L — ABNORMAL LOW (ref 135–145)
Total Bilirubin: 1.5 mg/dL — ABNORMAL HIGH (ref 0.3–1.2)
Total Protein: 6.6 g/dL (ref 6.5–8.1)

## 2019-08-25 LAB — URINALYSIS, ROUTINE W REFLEX MICROSCOPIC
Bilirubin Urine: NEGATIVE
Glucose, UA: NEGATIVE mg/dL
Ketones, ur: 5 mg/dL — AB
Nitrite: NEGATIVE
Protein, ur: 100 mg/dL — AB
Specific Gravity, Urine: 1.02 (ref 1.005–1.030)
WBC, UA: >50 WBC/hpf — ABNORMAL HIGH (ref 0–5)
pH: 7 (ref 5.0–8.0)

## 2019-08-25 LAB — CBC
HCT: 32.3 % — ABNORMAL LOW (ref 36.0–46.0)
Hemoglobin: 10.6 g/dL — ABNORMAL LOW (ref 12.0–15.0)
MCH: 30.5 pg (ref 26.0–34.0)
MCHC: 32.8 g/dL (ref 30.0–36.0)
MCV: 92.8 fL (ref 80.0–100.0)
Platelets: 138 10*3/uL — ABNORMAL LOW (ref 150–400)
RBC: 3.48 MIL/uL — ABNORMAL LOW (ref 3.87–5.11)
RDW: 13.4 % (ref 11.5–15.5)
WBC: 11.3 10*3/uL — ABNORMAL HIGH (ref 4.0–10.5)
nRBC: 0 % (ref 0.0–0.2)

## 2019-08-25 LAB — HEMOGLOBIN A1C
Hgb A1c MFr Bld: 6.1 % — ABNORMAL HIGH (ref 4.8–5.6)
Mean Plasma Glucose: 128.37 mg/dL

## 2019-08-25 LAB — HIV ANTIBODY (ROUTINE TESTING W REFLEX): HIV Screen 4th Generation wRfx: NONREACTIVE

## 2019-08-25 LAB — GLUCOSE, CAPILLARY
Glucose-Capillary: 203 mg/dL — ABNORMAL HIGH (ref 70–99)
Glucose-Capillary: 189 mg/dL — ABNORMAL HIGH (ref 70–99)
Glucose-Capillary: 217 mg/dL — ABNORMAL HIGH (ref 70–99)
Glucose-Capillary: 207 mg/dL — ABNORMAL HIGH (ref 70–99)

## 2019-08-25 LAB — BRAIN NATRIURETIC PEPTIDE: B Natriuretic Peptide: 583 pg/mL — ABNORMAL HIGH (ref 0.0–100.0)

## 2019-08-25 LAB — PROCALCITONIN: Procalcitonin: 1.97 ng/mL

## 2019-08-25 LAB — ECHOCARDIOGRAM COMPLETE
Height: 61 in
Weight: 2945.35 oz

## 2019-08-25 LAB — PROTIME-INR
INR: 1.9 — ABNORMAL HIGH (ref 0.8–1.2)
Prothrombin Time: 21.3 seconds — ABNORMAL HIGH (ref 11.4–15.2)

## 2019-08-25 LAB — MAGNESIUM: Magnesium: 1.8 mg/dL (ref 1.7–2.4)

## 2019-08-25 MED ORDER — ADULT MULTIVITAMIN W/MINERALS CH
1.0000 | ORAL_TABLET | Freq: Every day | ORAL | Status: DC
  Administered 2019-08-25 – 2019-08-27 (×3): 1 via ORAL
  Filled 2019-08-25 (×3): qty 1

## 2019-08-25 MED ORDER — MAGNESIUM OXIDE 400 (241.3 MG) MG PO TABS
400.0000 mg | ORAL_TABLET | Freq: Every day | ORAL | Status: DC
  Administered 2019-08-25 – 2019-08-27 (×3): 400 mg via ORAL
  Filled 2019-08-25 (×3): qty 1

## 2019-08-25 MED ORDER — FUROSEMIDE 10 MG/ML IJ SOLN
40.0000 mg | Freq: Once | INTRAMUSCULAR | Status: AC
  Administered 2019-08-25: 40 mg via INTRAVENOUS
  Filled 2019-08-25: qty 4

## 2019-08-25 MED ORDER — SODIUM CHLORIDE 0.9 % IV SOLN
1.0000 g | INTRAVENOUS | Status: DC
  Administered 2019-08-25: 1 g via INTRAVENOUS
  Filled 2019-08-25: qty 10

## 2019-08-25 MED ORDER — DILTIAZEM HCL ER COATED BEADS 240 MG PO CP24
240.0000 mg | ORAL_CAPSULE | Freq: Every day | ORAL | Status: DC
  Administered 2019-08-25 – 2019-08-27 (×3): 240 mg via ORAL
  Filled 2019-08-25 (×3): qty 1

## 2019-08-25 MED ORDER — WARFARIN - PHARMACIST DOSING INPATIENT: Freq: Every day | Status: DC

## 2019-08-25 MED ORDER — METOPROLOL SUCCINATE ER 50 MG PO TB24
75.0000 mg | ORAL_TABLET | Freq: Two times a day (BID) | ORAL | Status: DC
  Administered 2019-08-25 – 2019-08-27 (×5): 75 mg via ORAL
  Filled 2019-08-25 (×5): qty 1

## 2019-08-25 MED ORDER — AZITHROMYCIN 250 MG PO TABS
500.0000 mg | ORAL_TABLET | Freq: Every day | ORAL | Status: DC
  Administered 2019-08-25: 500 mg via ORAL
  Filled 2019-08-25: qty 2

## 2019-08-25 MED ORDER — WARFARIN SODIUM 5 MG PO TABS
5.0000 mg | ORAL_TABLET | Freq: Once | ORAL | Status: AC
  Administered 2019-08-25: 5 mg via ORAL
  Filled 2019-08-25: qty 1

## 2019-08-25 NOTE — Progress Notes (Addendum)
PROGRESS NOTE  CLARESSA COVILLE [id number] DOB: [birth year] DOA: 08/24/2019 PCP: Neale Burly, MD  Brief History:  86 year old female with a history of permanent atrial fibrillation, pulmonary hypertension, coronary disease, hypertension, hyperlipidemia, diabetes mellitus type 2, endometrial carcinoma presenting with 1 day history of shortness of breath and nonproductive cough.  She denies any fevers, chills, chest pain, abdominal pain, nausea, vomiting, diarrhea.  There is no hemoptysis.  She has not been start any new medications.  She states that she has chronic lower extremity edema.  She states that this is 90 worse than usual. Upon presentation, the patient was noted to have a fever up to 101.9 F, but she was hemodynamically stable.  The patient was started on ceftriaxone and azithromycin.  Assessment/Plan: Pneumonia -Continue ceftriaxone and azithromycin -Personally reviewed chest x-ray -Increased interstitial markings -Procalcitonin 1.97 -CT chest  Permanent atrial fibrillation -Rate controlled -Continue diltiazem -Continue warfarin  Cellulitis right lower extremity -Venous duplex -Continue ceftriaxone  Essential hypertension -Continue diltiazem CD -continue metoprolol succinate  Diabetes mellitus type 2, controlled -08/24/2019 hemoglobin A1c 6.1 -NovoLog sliding scale -Holding Metformin -Woman holding glipizide  Transaminasemia -likely due to chronic hepatic congestion from pulm HTN -RUQ ultrasound     Status is: Observation  The patient remains OBS appropriate and will d/c before 2 midnights.  Dispo: The patient is from: Home              Anticipated d/c is to: Home              Anticipated d/c date is: 2 days              Patient currently is not medically stable to d/c.  She continues to be sob, requiring IV abx.  Work up for SOB incomplete.  Continues to have fever        Family Communication:  No Family at bedside  Consultants:   none  Code Status:  FULL   DVT Prophylaxis:  coumadin   Procedures: As Listed in Progress Note Above  Antibiotics: None      Subjective: Patient continues to have shortness of breath and coughing.  She denies any nausea, vomiting, diarrhea, abdominal pain, dysuria, hematuria.  She continues to have fevers.  Objective: Vitals:   08/24/19 2305 08/24/19 2311 08/25/19 0000 08/25/19 0033  BP: (!) 121/54  123/69 130/80  Pulse: (!) 105  86 86  Resp: (!) 26  (!) 23 20  Temp:  98.8 F (37.1 C)  99.5 F (37.5 C)  TempSrc:  Oral  Oral  SpO2: 92%  96% 93%  Weight:    83.5 kg  Height:    5\' 1"  (1.549 m)    Intake/Output Summary (Last 24 hours) at 08/25/2019 0954 Last data filed at 08/25/2019 0300 Gross per 24 hour  Intake 1025 ml  Output --  Net 1025 ml   Weight change:  Exam:   General:  Pt is alert, follows commands appropriately, not in acute distress  HEENT: No icterus, No thrush, No neck mass, [place]/AT  Cardiovascular: IRRR, S1/S2, no rubs, no gallops  Respiratory: Bibasilar rales, wheezing.  Abdomen: Soft/+BS, non tender, non distended, no guarding  Extremities: 1+ LE edema, No lymphangitis, No petechiae, No rashes, no synovitis   Data Reviewed: I have personally reviewed following labs and imaging studies Basic Metabolic Panel: Recent Labs  Lab 08/24/19 1916 08/25/19 0449  NA 133* 134*  K 4.2 4.2  CL 98 100  CO2 23 22  GLUCOSE 192* 232*  BUN 30* 31*  CREATININE 1.00 1.04*  CALCIUM 9.0 8.5*  MG  --  1.8   Liver Function Tests: Recent Labs  Lab 08/24/19 1916 08/25/19 0449  AST 71* 93*  ALT 64* 88*  ALKPHOS 75 70  BILITOT 1.0 1.5*  PROT 7.3 6.6  ALBUMIN 3.9 3.3*   No results for input(s): LIPASE, AMYLASE in the last 168 hours. No results for input(s): AMMONIA in the last 168 hours. Coagulation Profile: Recent Labs  Lab 08/24/19 1916  INR 2.1*   CBC: Recent Labs  Lab 08/24/19 1916 08/25/19 0449  WBC 11.7* 11.3*  NEUTROABS 11.0*   --   HGB 11.2* 10.6*  HCT 34.5* 32.3*  MCV 91.8 92.8  PLT 169 138*   Cardiac Enzymes: No results for input(s): CKTOTAL, CKMB, CKMBINDEX, TROPONINI in the last 168 hours. BNP: Invalid input(s): POCBNP CBG: Recent Labs  Lab 08/25/19 0722  GLUCAP 203*   HbA1C: Recent Labs    08/24/19 1916  HGBA1C 6.1*   Urine analysis:    Component Value Date/Time   COLORURINE YELLOW 12/19/2008 [place] 12/19/2008 2303   LABSPEC 1.007 12/19/2008 2303   PHURINE 6.5 12/19/2008 2303   GLUCOSEU NEGATIVE 12/19/2008 2303   HGBUR SMALL (A) 12/19/2008 2303   BILIRUBINUR NEGATIVE 12/19/2008 2303   KETONESUR NEGATIVE 12/19/2008 2303   PROTEINUR NEGATIVE 12/19/2008 2303   UROBILINOGEN 0.2 12/19/2008 2303   NITRITE NEGATIVE 12/19/2008 2303   LEUKOCYTESUR SMALL (A) 12/19/2008 2303   Sepsis Labs: @LABRCNTIP (procalcitonin:4,lacticidven:4) ) Recent Results (from the past 240 hour(s))  Culture, blood (Routine x 2)     Status: None (Preliminary result)   Collection Time: 08/24/19  7:16 PM   Specimen: Right Antecubital; Blood  Result Value Ref Range Status   Specimen Description RIGHT ANTECUBITAL  Final   Special Requests   Final    BOTTLES DRAWN AEROBIC AND ANAEROBIC Blood Culture adequate volume   Culture   Final    NO GROWTH < 12 HOURS Performed at [facility], [address], [place], [place] [address]    Report Status PENDING  Incomplete  Culture, blood (Routine x 2)     Status: None (Preliminary result)   Collection Time: 08/24/19  7:29 PM   Specimen: Left Antecubital; Blood  Result Value Ref Range Status   Specimen Description LEFT ANTECUBITAL  Final   Special Requests   Final    BOTTLES DRAWN AEROBIC AND ANAEROBIC Blood Culture adequate volume   Culture   Final    NO GROWTH < 12 HOURS Performed at [facility], [address], [place], [place] [address]    Report Status PENDING  Incomplete  SARS Coronavirus 2 by RT PCR (hospital order, performed in [place]  hospital lab) Nasopharyngeal Nasopharyngeal Swab     Status: None   Collection Time: 08/24/19  9:27 PM   Specimen: Nasopharyngeal Swab  Result Value Ref Range Status   SARS Coronavirus 2 NEGATIVE NEGATIVE Final    Comment: (NOTE) SARS-CoV-2 target nucleic acids are NOT DETECTED. The SARS-CoV-2 RNA is generally detectable in upper and lower respiratory specimens during the acute phase of infection. The lowest concentration of SARS-CoV-2 viral copies this assay can detect is 250 copies / mL. A negative result does not preclude SARS-CoV-2 infection and should not be used as the sole basis for treatment or other patient management decisions.  A negative result may occur with improper specimen collection / handling, submission of specimen  other than nasopharyngeal swab, presence of viral mutation(s) within the areas targeted by this assay, and inadequate number of viral copies (<250 copies / mL). A negative result must be combined with clinical observations, patient history, and epidemiological information. Fact Sheet for Patients:   StrictlyIdeas.no Fact Sheet for Healthcare Providers: BankingDealers.co.za This test is not yet approved or cleared  by the [place] FDA and has been authorized for detection and/or diagnosis of SARS-CoV-2 by FDA under an Emergency Use Authorization (EUA).  This EUA will remain in effect (meaning this test can be used) for the duration of the COVID-19 declaration under Section 564(b)(1) of the Act, 21 U.S.C. section 360bbb-3(b)(1), unless the authorization is terminated or revoked sooner. Performed at [facility], [address] [place], [place] [address]      Scheduled Meds: . azithromycin  500 mg Oral QHS  . insulin aspart  0-15 Units Subcutaneous TID WC  . insulin aspart  0-5 Units Subcutaneous QHS   Continuous Infusions: . sodium chloride 1,000 mL (08/25/19 0840)  . cefTRIAXone (ROCEPHIN)  IV       Procedures/Studies: DG Chest Port 1 View  Result Date: 08/24/2019 CLINICAL DATA:  86 year old female with cough fever and malaise. Test for COVID-19. Pending. EXAM: PORTABLE CHEST 1 VIEW COMPARISON:  Portable chest 09/20/2010 and earlier. FINDINGS: Portable AP upright view at 2020 hours. Chronic cardiomegaly and sequelae of CABG. Calcified aortic atherosclerosis. Stable cardiac size and mediastinal contours. Visualized tracheal air column is within normal limits. Diffuse increased pulmonary interstitial opacity. No pleural fluid identified. No pneumothorax or consolidation. Paucity of bowel gas in the upper abdomen. Chronic degenerative changes at both shoulders. No acute osseous abnormality identified. IMPRESSION: Chronic cardiomegaly with nonspecific increased pulmonary interstitium. Consider interstitial edema versus viral/atypical respiratory infection. No pleural effusion is evident. Electronically Signed   By: Genevie Ann M.D.   On: 08/24/2019 20:31   OCT, Retina - OU - Both Eyes  Result Date: 08/05/2019 Right Eye Quality was good. Central Foveal Thickness: 263. Progression has improved. Findings include abnormal foveal contour, intraretinal fluid, no SRF (Mild interval improvement in IRF-temporal fovea). Left Eye Quality was good. Central Foveal Thickness: 230. Progression has been stable. Findings include normal foveal contour, intraretinal fluid, no SRF (Persistent Mild cystic changes). Notes *Images captured and stored on drive Diagnosis / Impression: OD: Mild interval improvement in IRF temporal fovea OS: NFP; +cystic changes / DME; no SRF Clinical management: See below Abbreviations: NFP - Normal foveal profile. CME - cystoid macular edema. PED - pigment epithelial detachment. IRF - intraretinal fluid. SRF - subretinal fluid. EZ - ellipsoid zone. ERM - epiretinal membrane. ORA - outer retinal atrophy. ORT - outer retinal tubulation. SRHM - subretinal hyper-reflective material   Fluorescein  Angiography Optos (Transit OS)  Result Date: 08/05/2019 Right Eye Progression has no prior data. Early phase findings include vascular perfusion defect, microaneurysm, blockage, leakage. Mid/Late phase findings include blockage. Left Eye Progression has no prior data. Early phase findings include blockage, microaneurysm, vascular perfusion defect, staining, leakage. Mid/Late phase findings include blockage, staining, microaneurysm, leakage. Notes **Images stored on drive** Impression: Moderate NPDR OU Late leaking MA Scattered perfusion defect OU No NV OU    Orson Eva, DO  [facility]  If 7PM-7AM, please contact night-coverage [email or web address] Password [email or web address] 08/25/2019, 9:54 AM   LOS: 0 days

## 2019-08-25 NOTE — Progress Notes (Addendum)
ANTICOAGULATION CONSULT NOTE - Initial Consult  Pharmacy Consult for warfarin Indication: atrial fibrillation  Allergies  Allergen Reactions  . Statins Other (See Comments)    No energy, fatigue.     Patient Measurements: Height: 5\' 1"  (154.9 cm) Weight: 83.5 kg (184 lb 1.4 oz) IBW/kg (Calculated) : 47.8   Vital Signs: Temp: 99.5 F (37.5 C) (05/20 0033) Temp Source: Oral (05/20 0033) BP: 130/80 (05/20 0033) Pulse Rate: 86 (05/20 0033)  Labs: Recent Labs    08/24/19 1916 08/25/19 0449  HGB 11.2* 10.6*  HCT 34.5* 32.3*  PLT 169 138*  LABPROT 22.7*  --   INR 2.1*  --   CREATININE 1.00 1.04*    Estimated Creatinine Clearance: 38.1 mL/min (A) (by C-G formula based on SCr of 1.04 mg/dL (H)).   Medical History: Past Medical History:  Diagnosis Date  . Atrial fibrillation [facility])    Maze procedure September, 2010, amiodarone and Coumadin stopped eventually January, 2011  . Coronary artery disease   . Ejection fraction    EF 60%, echo, September, 2010  . Endometrial ca [facility]) 09/25/2010   June, 2012, radiation and chemotherapy, Dr. Tressie Stalker  . Fluid overload   . Hiatal hernia   . HTN (hypertension)   . Hx of CABG    2010, Dr.Van Trigt, Maze procedure, ligation of left atrial appendage, Coumadin stopped January, 2011  . Hyperlipidemia   . Noninsulin dependent diabetes mellitus   . Osteoarthritis    Severe  . Renal insufficiency    Mild with higher dose diuretic  . Shortness of breath   . Statin intolerance    Is    Medications:  Medications Prior to Admission  Medication Sig Dispense Refill Last Dose  . alendronate (FOSAMAX) 70 MG tablet Take 70 mg by mouth once a week. Take with a full glass of water on an empty stomach.     . Calcium Carbonate (CALCIUM 500 PO) Take 500 mg by mouth 2 (two) times daily.     . Cholecalciferol (VITAMIN D PO) Take 400 Units by mouth daily.      Marland Kitchen denosumab (PROLIA) 60 MG/ML SOSY injection Inject 60 mg into the skin every 6  (six) months.     . diltiazem (CARDIZEM CD) 240 MG 24 hr capsule Take 240 mg by mouth daily.     . Diphenhydramine-APAP, sleep, (TYLENOL PM EXTRA STRENGTH PO) Take 1 tablet by mouth at bedtime as needed. Sleep     . furosemide (LASIX) 20 MG tablet Take 1 tablet by mouth daily.  0   . glipiZIDE (GLUCOTROL) 5 MG tablet Take 5 mg by mouth daily.      . Glucosamine 500 MG CAPS Take 500 mg by mouth 2 (two) times daily.       Marland Kitchen HYDROcodone-acetaminophen (NORCO/VICODIN) 5-325 MG per tablet Take 1 tablet by mouth every 8 (eight) hours as needed for moderate pain.     . magnesium oxide (MAG-OX) 400 MG tablet Take 400 mg by mouth daily.     . metFORMIN (GLUCOPHAGE) 500 MG tablet Take 500 mg by mouth at bedtime. Once daily at bedtime     . metoprolol succinate (TOPROL-XL) 50 MG 24 hr tablet TAKE 1 AND 1/2 TABLETS BY MOUTH TWICE DAILY 270 tablet 2   . Multiple Vitamin (MULTIVITAMIN) tablet Take 1 tablet by mouth daily.     . Omega-3 Fatty Acids (FISH OIL) 1200 MG CAPS Take 1,200 mg by mouth 2 (two) times daily.     Marland Kitchen  sodium bicarbonate 650 MG tablet Take 650 mg by mouth 3 (three) times daily.     Marland Kitchen warfarin (COUMADIN) 5 MG tablet Take 5 mg by mouth daily. MANAGED BY PMD       Assessment: Pharmacy consulted to dose warfarin for patient with atrial fibrillation. Patient's home dose listed as 5 mg daily but patient unable to verify dosing.  Patient's pharmacy does not fill warfarin prescription and previous med rec lists PMD as source. Trying to verify.   INR currently at 1.9    Goal of Therapy:  INR 2-3 Monitor platelets by anticoagulation protocol: Yes   Plan:  Warfarin 5 mg x 1 dose. Monitor daily INR and s/s of bleeding.  Margot Ables, PharmD Clinical Pharmacist 08/25/2019 3:03 PM

## 2019-08-25 NOTE — H&P (Signed)
History and Physical    [name] [id number] DOB: 09/07/1930 DOA: 08/24/2019  PCP: Neale Burly, MD (Confirm with patient/family/NH records and if not entered, this has to be entered at [facility] point of entry) Patient coming from: Home  I have personally briefly reviewed patient's old medical records in [place]  Chief Complaint: Generalized weakness.  HPI: [name] is a 86 y.o. female with medical history significant of pain, hyperlipidemia, atrial fibrillation, coronary artery disease s/p CABG and diabetes mellitus presented to ED for evaluation of worsening cough with generalized weakness.  Patient states that the cough started today as a dry cough and it continue to worsen and become productive but no hemoptysis.  Patient states she is also having fevers with chills but denies shortness of breath, sore throat, chest pain, loss of taste and smell sensation and contact with any Covid positive patient.  Patient also denies nausea, vomiting, diarrhea, abdominal pain and urinary symptoms.  Patient further mentioned that she has been fully vaccinated for COVID-19.   ED Course: To the ED patient had temperature of 101.9, blood pressure 142/69, heart rate 108, respiratory rate 22 and oxygen saturation 96% on room air.  Blood work showed WBC 11.7, hemoglobin 11.2, sodium 133, potassium 4.2, BUN 30, creatinine 1, blood glucose 192.  INR 2.1.  COVID-19 PCR negative.  Chest x-ray showed interstitial edema versus atypical respiratory infection.  Was started on ceftriaxone and azithromycin in the ED.  Review of Systems: As per HPI otherwise 10 point review of systems negative.  Unacceptable ROS statements: "10 systems reviewed," "Extensive" (without elaboration).  Acceptable ROS statements: "All others negative," "All others reviewed and are negative," and "All others unremarkable," with at [place] documented Can't double dip - if using for HPI can't use for ROS  Past  Medical History:  Diagnosis Date  . Atrial fibrillation [facility])    Maze procedure September, 2010, amiodarone and Coumadin stopped eventually January, 2011  . Coronary artery disease   . Ejection fraction    EF 60%, echo, September, 2010  . Endometrial ca [facility]) 09/25/2010   June, 2012, radiation and chemotherapy, Dr. Tressie Stalker  . Fluid overload   . Hiatal hernia   . HTN (hypertension)   . Hx of CABG    2010, Dr.Van Trigt, Maze procedure, ligation of left atrial appendage, Coumadin stopped January, 2011  . Hyperlipidemia   . Noninsulin dependent diabetes mellitus   . Osteoarthritis    Severe  . Renal insufficiency    Mild with higher dose diuretic  . Shortness of breath   . Statin intolerance    Is    Past Surgical History:  Procedure Laterality Date  . ABDOMINAL HYSTERECTOMY  07/2010   complete, endometrial cancer  . APPENDECTOMY  60 yrs ago  . BREAST MASS EXCISION     left breast 1985  . CARDIAC CATHETERIZATION    . CATARACT EXTRACTION Bilateral 2006   Dr. Iona Hansen  . choleycystectomy  1998  . COLONOSCOPY     several in [place], last one by Dr. Rowe Pavy, polyps once in [place]. records requested.  . COLONOSCOPY  06/2010   Dr. Rodrigo Ran good. Moderate diverticulosis in left colon. Inflammation found in descending colon, scope could not be advanced forward.  ACBE-->sigmoid diverticulosis no worrisome findings.   . CORONARY ARTERY BYPASS GRAFT  12/21/08   [place] Gum; LIMA LAD, SVG posterior descending, SVG OM, maze procedure, ligation left atrial appendage and MAZE procedure for A.fib  . ESOPHAGOGASTRODUODENOSCOPY  03/08/2012   [id number] ring was found at the gastroesophageal junction/Nodular gastritis (inflammation)/duodenal mucosa showed no abnormalities   . PORT-A-CATH REMOVAL  01/23/2012   Procedure: REMOVAL PORT-A-CATH;  Surgeon: Jamesetta So, MD;  Location: [facility];  Service: General;  Laterality: N/A;  Minor Room  . PORTACATH PLACEMENT    . TONSILECTOMY, ADENOIDECTOMY,  BILATERAL MYRINGOTOMY AND TUBES    . TUMOR REMOVAL     from throat  benign     reports that she has never smoked. She has never used smokeless tobacco. She reports that she does not drink alcohol or use drugs.  Allergies  Allergen Reactions  . Statins Other (See Comments)    No energy, fatigue.     Family History  Problem Relation Age of Onset  . Hypertension Mother   . Breast cancer Mother   . Heart attack Mother        deceased, age 68  . Diabetes Sister        deceased - 106, renal failure,   . Colon cancer Neg Hx   . Ovarian cancer Neg Hx   . Lung cancer Neg Hx   . Liver disease Neg Hx    Unacceptable: Noncontributory, unremarkable, or negative. Acceptable: (example)Family history negative for heart disease  Prior to Admission medications   Medication Sig Start Date End Date Taking? Authorizing Provider  alendronate (FOSAMAX) 70 MG tablet Take 70 mg by mouth once a week. Take with a full glass of water on an empty stomach.    [provider]  Calcium Carbonate (CALCIUM 500 PO) Take 500 mg by mouth 2 (two) times daily.    [provider]  Cholecalciferol (VITAMIN D PO) Take 400 Units by mouth daily.     [provider]  denosumab (PROLIA) 60 MG/ML SOSY injection Inject 60 mg into the skin every 6 (six) months.    [provider]  diltiazem (CARDIZEM CD) 240 MG 24 hr capsule Take 240 mg by mouth daily.    [provider]  Diphenhydramine-APAP, sleep, (TYLENOL PM EXTRA STRENGTH PO) Take 1 tablet by mouth at bedtime as needed. Sleep    [provider]  furosemide (LASIX) 20 MG tablet Take 1 tablet by mouth daily. 06/13/15   [provider]  glipiZIDE (GLUCOTROL) 5 MG tablet Take 5 mg by mouth daily.     [provider]  Glucosamine 500 MG CAPS Take 500 mg by mouth 2 (two) times daily.      [provider]  HYDROcodone-acetaminophen (NORCO/VICODIN) 5-325 MG per tablet Take 1 tablet by mouth every 8  (eight) hours as needed for moderate pain.    [provider]  magnesium oxide (MAG-OX) 400 MG tablet Take 400 mg by mouth daily.    [provider]  metFORMIN (GLUCOPHAGE) 500 MG tablet Take 500 mg by mouth at bedtime. Once daily at bedtime    [provider]  metoprolol succinate (TOPROL-XL) 50 MG 24 hr tablet TAKE 1 AND 1/2 TABLETS BY MOUTH TWICE DAILY 08/01/19   Herminio Commons, MD  Multiple Vitamin (MULTIVITAMIN) tablet Take 1 tablet by mouth daily.    [provider]  Omega-3 Fatty Acids (FISH OIL) 1200 MG CAPS Take 1,200 mg by mouth 2 (two) times daily.    [provider]  sodium bicarbonate 650 MG tablet Take 650 mg by mouth 3 (three) times daily.    [provider]  warfarin (COUMADIN) 5 MG tablet Take 5 mg by mouth daily.  MANAGED BY PMD    [provider]    Physical Exam: Vitals:   08/24/19 2305 08/24/19 2311 08/25/19 0000 08/25/19 0033  BP: (!) 121/54  123/69 130/80  Pulse: (!) 105  86 86  Resp: (!) 26  (!) 23 20  Temp:  98.8 F (37.1 C)  99.5 F (37.5 C)  TempSrc:  Oral  Oral  SpO2: 92%  96% 93%  Weight:    83.5 kg  Height:    5\' 1"  (1.549 m)    Constitutional: NAD, calm, comfortable Vitals:   08/24/19 2305 08/24/19 2311 08/25/19 0000 08/25/19 0033  BP: (!) 121/54  123/69 130/80  Pulse: (!) 105  86 86  Resp: (!) 26  (!) 23 20  Temp:  98.8 F (37.1 C)  99.5 F (37.5 C)  TempSrc:  Oral  Oral  SpO2: 92%  96% 93%  Weight:    83.5 kg  Height:    5\' 1"  (1.549 m)    General: Is a 86 year old female not in acute distress. Eyes: PERRL, lids and conjunctivae normal ENMT: Mucous membranes are moist. Posterior pharynx clear of any exudate or lesions.Normal dentition.  Neck: normal, supple, no masses, no thyromegaly Respiratory: On auscultation there is decreased wounds in bilateral lung bases along with mild rhonchi but no wheezes or crackle. Normal respiratory effort. No accessory muscle use.    Cardiovascular: No chest pain on palpation.  Irregularly irregular rhythm with normal heart rate. No murmurs / rubs / gallops. No extremity edema. 2+ pedal pulses. No carotid bruits.  Abdomen: no tenderness, no masses palpated. No hepatosplenomegaly. Bowel sounds positive.  Musculoskeletal: no clubbing / cyanosis. No joint deformity upper and lower extremities. Good ROM, no contractures. Normal muscle tone.  Skin: no rashes, lesions, ulcers. No induration Neurologic: CN 2-12 grossly intact. Sensation intact, DTR normal. Strength 5/5 in all 4.  Psychiatric: Normal judgment and insight. Alert and oriented x 3. Normal mood.   (Anything < 9 systems with 2 bullets each down codes to level 1) (If patient refuses exam can't bill higher level) (Make sure to document decubitus ulcers present on admission -- if possible -- and whether patient has chronic indwelling catheter at time of admission)  Labs on Admission: I have personally reviewed following labs and imaging studies  CBC: Recent Labs  Lab 08/24/19 1916  WBC 11.7*  NEUTROABS 11.0*  HGB 11.2*  HCT 34.5*  MCV 91.8  PLT 123XX123   Basic Metabolic Panel: Recent Labs  Lab 08/24/19 1916  NA 133*  K 4.2  CL 98  CO2 23  GLUCOSE 192*  BUN 30*  CREATININE 1.00  CALCIUM 9.0   GFR: Estimated Creatinine Clearance: 39.6 mL/min (by C-G formula based on SCr of 1 mg/dL). Liver Function Tests: Recent Labs  Lab 08/24/19 1916  AST 71*  ALT 64*  ALKPHOS 75  BILITOT 1.0  PROT 7.3  ALBUMIN 3.9   No results for input(s): LIPASE, AMYLASE in the last 168 hours. No results for input(s): AMMONIA in the last 168 hours. Coagulation Profile: Recent Labs  Lab 08/24/19 1916  INR 2.1*   Cardiac Enzymes: No results for input(s): CKTOTAL, CKMB, CKMBINDEX, TROPONINI in the last 168 hours. BNP (last 3 results) No results for input(s): PROBNP in the last 8760 hours. HbA1C: No results for input(s): HGBA1C in the last 72 hours. CBG: No results  for input(s): GLUCAP in the last 168 hours. Lipid Profile: No results for input(s): CHOL, HDL, LDLCALC, TRIG, CHOLHDL, LDLDIRECT in the  last 72 hours. Thyroid Function Tests: No results for input(s): TSH, T4TOTAL, FREET4, T3FREE, THYROIDAB in the last 72 hours. Anemia Panel: No results for input(s): VITAMINB12, FOLATE, FERRITIN, TIBC, IRON, RETICCTPCT in the last 72 hours. Urine analysis:    Component Value Date/Time   COLORURINE YELLOW 12/19/2008 2303   APPEARANCEUR CLEAR 12/19/2008 2303   LABSPEC 1.007 12/19/2008 2303   PHURINE 6.5 12/19/2008 2303   GLUCOSEU NEGATIVE 12/19/2008 2303   HGBUR SMALL (A) 12/19/2008 2303   BILIRUBINUR NEGATIVE 12/19/2008 2303   KETONESUR NEGATIVE 12/19/2008 2303   PROTEINUR NEGATIVE 12/19/2008 2303   UROBILINOGEN 0.2 12/19/2008 2303   NITRITE NEGATIVE 12/19/2008 2303   LEUKOCYTESUR SMALL (A) 12/19/2008 2303    Radiological Exams on Admission: DG Chest Port 1 View  Result Date: 08/24/2019 CLINICAL DATA:  86 year old female with cough fever and malaise. Test for COVID-19. Pending. EXAM: PORTABLE CHEST 1 VIEW COMPARISON:  Portable chest 09/20/2010 and earlier. FINDINGS: Portable AP upright view at 2020 hours. Chronic cardiomegaly and sequelae of CABG. Calcified aortic atherosclerosis. Stable cardiac size and mediastinal contours. Visualized tracheal air column is within normal limits. Diffuse increased pulmonary interstitial opacity. No pleural fluid identified. No pneumothorax or consolidation. Paucity of bowel gas in the upper abdomen. Chronic degenerative changes at both shoulders. No acute osseous abnormality identified. IMPRESSION: Chronic cardiomegaly with nonspecific increased pulmonary interstitium. Consider interstitial edema versus viral/atypical respiratory infection. No pleural effusion is evident. Electronically Signed   By: Genevie Ann M.D.   On: 08/24/2019 20:31    E  Assessment/Plan Principal Problem:    Pneumonia Continue IV ceftriaxone 1 g  daily and azithromycin 500 mg p.o. daily. Oxygen supplementation with nasal cannula as needed. Tessalon pearls 3 times a day as needed for cough Blood cultures ordered Tylenol every 6 hours as needed for fever  Active Problems:   Essential hypertension Blood pressure is stable at this time. Home blood pressure medication will be started once [place] reconciliation done by the pharmacy.      Hyperglycemia due to diabetes mellitus (HCC) Moderate dose sliding scale insulin ordered. Blood glucose monitoring and hypoglycemic protocol in place.  Atrial fibrillation Stable. Patient will be started on home medications after confirmation.  Medication reconciliation not done by pharmacy. According to medical records patient was on warfarin and it was discontinued long time ago but patient is still not sure.  Warfarin can be started after confirmation.  (please populate well all problems here in Problem List. (For example, if patient is on BP meds at home and you resume or decide to hold them, it is a problem that needs to be her. Same for CAD, COPD, HLD and so on)     DVT prophylaxis: SCDs ordered Code Status: Full code Family Communication: No family member at bedside Disposition Plan:  Consults called:  Admission status : Observation/telemetry   Edmonia Lynch MD [facility] Pager [phone]  If 7PM-7AM, please contact night-coverage [email or web address] Password [email or web address]  08/25/2019, 5:59 AM

## 2019-08-25 NOTE — Progress Notes (Signed)
  Echocardiogram 2D Echocardiogram has been performed.  [name] 08/25/2019, 2:09 PM

## 2019-08-25 NOTE — Progress Notes (Signed)
CRITICAL VALUE ALERT  Critical Value: Gram positive cocci blood cultures  Date & Time Notied:  5/20 at 1628  Provider Notified: Tat  Orders Received/Actions taken: awaiting new orders  Deirdre Pippins, RN

## 2019-08-25 NOTE — Plan of Care (Signed)

## 2019-08-26 DIAGNOSIS — I4891 Unspecified atrial fibrillation: Secondary | ICD-10-CM

## 2019-08-26 DIAGNOSIS — I10 Essential (primary) hypertension: Secondary | ICD-10-CM

## 2019-08-26 DIAGNOSIS — I272 Pulmonary hypertension, unspecified: Secondary | ICD-10-CM

## 2019-08-26 DIAGNOSIS — R7401 Elevation of levels of liver transaminase levels: Secondary | ICD-10-CM

## 2019-08-26 DIAGNOSIS — B955 Unspecified streptococcus as the cause of diseases classified elsewhere: Secondary | ICD-10-CM

## 2019-08-26 DIAGNOSIS — I5031 Acute diastolic (congestive) heart failure: Secondary | ICD-10-CM

## 2019-08-26 LAB — GLUCOSE, CAPILLARY
Glucose-Capillary: 118 mg/dL — ABNORMAL HIGH (ref 70–99)
Glucose-Capillary: 107 mg/dL — ABNORMAL HIGH (ref 70–99)
Glucose-Capillary: 170 mg/dL — ABNORMAL HIGH (ref 70–99)
Glucose-Capillary: 145 mg/dL — ABNORMAL HIGH (ref 70–99)
Glucose-Capillary: 157 mg/dL — ABNORMAL HIGH (ref 70–99)

## 2019-08-26 LAB — BASIC METABOLIC PANEL
Anion gap: 12 (ref 5–15)
BUN: 35 mg/dL — ABNORMAL HIGH (ref 8–23)
CO2: 22 mmol/L (ref 22–32)
Calcium: 8.3 mg/dL — ABNORMAL LOW (ref 8.9–10.3)
Chloride: 100 mmol/L (ref 98–111)
Creatinine, Ser: 1.19 mg/dL — ABNORMAL HIGH (ref 0.44–1.00)
GFR calc Af Amer: 48 mL/min — ABNORMAL LOW (ref >60)
GFR calc non Af Amer: 41 mL/min — ABNORMAL LOW (ref >60)
Glucose, Bld: 125 mg/dL — ABNORMAL HIGH (ref 70–99)
Potassium: 3.9 mmol/L (ref 3.5–5.1)
Sodium: 134 mmol/L — ABNORMAL LOW (ref 135–145)

## 2019-08-26 LAB — BLOOD CULTURE ID PANEL (REFLEXED)

## 2019-08-26 LAB — CBC
HCT: 30.9 % — ABNORMAL LOW (ref 36.0–46.0)
Hemoglobin: 9.9 g/dL — ABNORMAL LOW (ref 12.0–15.0)
MCH: 29.6 pg (ref 26.0–34.0)
MCHC: 32 g/dL (ref 30.0–36.0)
MCV: 92.2 fL (ref 80.0–100.0)
Platelets: 121 10*3/uL — ABNORMAL LOW (ref 150–400)
RBC: 3.35 MIL/uL — ABNORMAL LOW (ref 3.87–5.11)
RDW: 13.4 % (ref 11.5–15.5)
WBC: 7.6 10*3/uL (ref 4.0–10.5)
nRBC: 0 % (ref 0.0–0.2)

## 2019-08-26 LAB — LEGIONELLA PNEUMOPHILA SEROGP 1 UR AG: L. pneumophila Serogp 1 Ur Ag: NEGATIVE

## 2019-08-26 LAB — PROTIME-INR
INR: 1.7 — ABNORMAL HIGH (ref 0.8–1.2)
Prothrombin Time: 19.7 seconds — ABNORMAL HIGH (ref 11.4–15.2)

## 2019-08-26 MED ORDER — FUROSEMIDE 10 MG/ML IJ SOLN
40.0000 mg | Freq: Once | INTRAMUSCULAR | Status: AC
  Administered 2019-08-26: 40 mg via INTRAVENOUS
  Filled 2019-08-26: qty 4

## 2019-08-26 MED ORDER — WARFARIN SODIUM 7.5 MG PO TABS
7.5000 mg | ORAL_TABLET | Freq: Once | ORAL | Status: AC
  Administered 2019-08-26: 7.5 mg via ORAL
  Filled 2019-08-26: qty 1

## 2019-08-26 MED ORDER — SODIUM CHLORIDE 0.9 % IV SOLN
2.0000 g | INTRAVENOUS | Status: DC
  Administered 2019-08-26 – 2019-08-27 (×2): 2 g via INTRAVENOUS
  Filled 2019-08-26 (×2): qty 20

## 2019-08-26 NOTE — Care Management Important Message (Signed)
Important Message  Patient Details  Name: [name] MRN: [id number] Date of Birth: 12-22-1930   Medicare Important Message Given:  Yes     Tommy Medal 08/26/2019, 11:44 AM

## 2019-08-26 NOTE — TOC Initial Note (Signed)
Transition of Care [facility]) - Initial/Assessment Note   Patient Details  Name: [name] MRN: [id number] Date of Birth: 08/05/1930  [facility] CM/SW Contact:    Sherie Don, LCSW Phone Number: 08/26/2019, 2:10 PM  Clinical Narrative: Patient is an 86 year old female who was admitted to the hospital for pneumonia. CSW met with patient to discuss possible HH needs. Patient reported that she lives independently and does not think she will need RN or PT services.          Expected Discharge Plan: Home/Self Care Barriers to Discharge: Continued Medical Work up  Patient Goals and CMS Choice Patient states their goals for this hospitalization and ongoing recovery are:: Return home CMS Medicare.gov Compare Post Acute Care list provided to:: Patient Choice offered to / list presented to : Patient  Expected Discharge Plan and Services Expected Discharge Plan: Home/Self Care Post Acute Care Choice: NA Living arrangements for the past 2 months: Single Family Home  Prior Living Arrangements/Services Living arrangements for the past 2 months: Single Family Home Lives with:: Self Patient language and need for interpreter reviewed:: Yes Do you feel safe going back to the place where you live?: Yes      Need for Family Participation in Patient Care: No (Comment) Care giver support system in place?: Yes (comment)(Katie Cox (sister) PH: [phone])   Criminal Activity/Legal Involvement Pertinent to Current Situation/Hospitalization: No - Comment as needed  Activities of Daily Living Home Assistive Devices/Equipment: Environmental consultant (specify type) ADL Screening (condition at time of admission) Patient's cognitive ability adequate to safely complete daily activities?: Yes Is the patient deaf or have difficulty hearing?: Yes Does the patient have difficulty seeing, even when wearing glasses/contacts?: No Does the patient have difficulty concentrating, remembering, or making decisions?:  No Patient able to express need for assistance with ADLs?: Yes Does the patient have difficulty dressing or bathing?: No Independently performs ADLs?: Yes (appropriate for developmental age) Does the patient have difficulty walking or climbing stairs?: No Weakness of Legs: None Weakness of Arms/Hands: None  Emotional Assessment Appearance:: Appears stated age Attitude/Demeanor/Rapport: Engaged Affect (typically observed): Appropriate Orientation: : Oriented to Self, Oriented to Place, Oriented to  Time, Oriented to Situation Alcohol / Substance Use: Not Applicable Psych Involvement: No (comment)  Admission diagnosis:  Cough [R05] Pneumonia [J18.9] Community acquired pneumonia, unspecified laterality [J18.9] Patient Active Problem List   Diagnosis Date Noted  . Bacteremia due to Streptococcus 08/26/2019  . Acute diastolic CHF (congestive heart failure) ([place]) 08/26/2019  . Transaminasemia   . Hyperglycemia due to diabetes mellitus ([place]) 08/25/2019  . Pneumonia 08/24/2019  . Chronic anticoagulation 03/22/2013  . Mitral regurgitation 03/22/2013  . Aortic sclerosis 03/22/2013  . Pulmonary hypertension ([place]) 03/22/2013  . Gastritis, atrophic 09/15/2012  . Hiatal hernia 02/24/2012  . Abnormality of esophagus 02/24/2012  . Anemia 02/24/2012  . Statin intolerance   . Hx of CABG   . Coronary artery disease   . Fluid overload   . Renal insufficiency   . Ejection fraction   . Atrial fibrillation ([place])   . Endometrial ca ([place]) 09/25/2010  . Shortness of breath 03/27/2009  . DM 01/23/2009  . HYPERLIPIDEMIA 01/23/2009  . Essential hypertension 01/23/2009   PCP:  Neale Burly, MD Pharmacy:   [facility], [place] [address] [place] [address] Phone: [phone] Fax: [phone]  Readmission Risk Interventions No flowsheet data found.

## 2019-08-26 NOTE — Progress Notes (Signed)
PROGRESS NOTE  [name] [id number] DOB: May 28, 1930 DOA: 08/24/2019 PCP: Neale Burly, MD  Brief History:  86 year old female with a history of permanent atrial fibrillation, pulmonary hypertension, coronary disease, hypertension, hyperlipidemia, diabetes mellitus type 2, endometrial carcinoma presenting with 1 day history of shortness of breath and nonproductive cough.  She denies any fevers, chills, chest pain, abdominal pain, nausea, vomiting, diarrhea.  There is no hemoptysis.  She has not been start any new medications.  She states that she has chronic lower extremity edema.  She states that this is 90 worse than usual. Upon presentation, the patient was noted to have a fever up to 101.9 F, but she was hemodynamically stable.  The patient was started on ceftriaxone and azithromycin.  Assessment/Plan: Bacteremia--Strep mitis -Continue ceftriaxone--increase to 2 grams daily -d/c azithromycin -Personally reviewed chest x-ray--Increased interstitial markings -Procalcitonin 1.97 -CT chest--bilateral pleural effusions R>L; left thyroid nodule  Acute diastolic CHF -repeat IV lasix 40 mg -08/25/19 Echo--EF 50-55%, no WMA, mod decrease RV function; severe elevated PASP -daily weights -accurate I/Os  Permanent atrial fibrillation -Rate controlled -Continue diltiazem -Continue warfarin  Cellulitis right lower extremity -Venous duplex--neg -Continue ceftriaxone  Essential hypertension -Continue diltiazem CD -continue metoprolol succinate  Diabetes mellitus type 2, controlled -08/24/2019 hemoglobin A1c 6.1 -NovoLog sliding scale--discontinue -Holding Metformin -Woman holding glipizide  Transaminasemia -likely due to chronic hepatic congestion from pulm HTN -RUQ ultrasound s/p chole; hepatic steatosis     Status is: Inpatient  Requiring IV abx for bacteremia  Dispo: The patient is from: Home  Anticipated d/c is to:  Home  Anticipated d/c date is: 5/22 if stable  Patient currently is not medically stable to d/c.  She continues to be sob, requiring IV abx.         Family Communication:  No Family at bedside  Consultants:  none  Code Status:  FULL   DVT Prophylaxis:  coumadin   Procedures: As Listed in Progress Note Above  Antibiotics: None   Subjective:  Pt is breathing better but complains of sob with exertion.  Denies f/c, cp, n/v/d, abd pain, headache Objective: Vitals:   08/25/19 1959 08/25/19 2112 08/26/19 0511 08/26/19 0900  BP:  126/67 126/68   Pulse:  67 67   Resp:  20 20   Temp:  99.8 F (37.7 C) 98.4 F (36.9 C)   TempSrc:  Oral Oral   SpO2: 92% 92% 95% 96%  Weight:      Height:        Intake/Output Summary (Last 24 hours) at 08/26/2019 1329 Last data filed at 08/26/2019 1100 Gross per 24 hour  Intake 680 ml  Output 650 ml  Net 30 ml   Weight change:  Exam:   General:  Pt is alert, follows commands appropriately, not in acute distress  HEENT: No icterus, No thrush, No neck mass, [place]/AT  Cardiovascular: IRRR, S1/S2, no rubs, no gallops+JVD  Respiratory: bibasilar crackles. No wheeze  Abdomen: Soft/+BS, non tender, non distended, no guarding  Extremities: trace LE edema, No lymphangitis, No petechiae, No rashes, no synovitis   Data Reviewed: I have personally reviewed following labs and imaging studies Basic Metabolic Panel: Recent Labs  Lab 08/24/19 1916 08/25/19 0449 08/26/19 0450  NA 133* 134* 134*  K 4.2 4.2 3.9  CL 98 100 100  CO2 23 22 22   GLUCOSE 192* 232* 125*  BUN 30* 31* 35*  CREATININE 1.00 1.04* 1.19*  CALCIUM 9.0 8.5*  8.3*  MG  --  1.8  --    Liver Function Tests: Recent Labs  Lab 08/24/19 1916 08/25/19 0449  AST 71* 93*  ALT 64* 88*  ALKPHOS 75 70  BILITOT 1.0 1.5*  PROT 7.3 6.6  ALBUMIN 3.9 3.3*   No results for input(s): LIPASE, AMYLASE in the last 168 hours. No results for  input(s): AMMONIA in the last 168 hours. Coagulation Profile: Recent Labs  Lab 08/24/19 1916 08/25/19 1034 08/26/19 0450  INR 2.1* 1.9* 1.7*   CBC: Recent Labs  Lab 08/24/19 1916 08/25/19 0449 08/26/19 0450  WBC 11.7* 11.3* 7.6  NEUTROABS 11.0*  --   --   HGB 11.2* 10.6* 9.9*  HCT 34.5* 32.3* 30.9*  MCV 91.8 92.8 92.2  PLT 169 138* 121*   Cardiac Enzymes: No results for input(s): CKTOTAL, CKMB, CKMBINDEX, TROPONINI in the last 168 hours. BNP: Invalid input(s): POCBNP CBG: Recent Labs  Lab 08/25/19 1056 08/25/19 1603 08/25/19 2114 08/26/19 0729 08/26/19 1112  GLUCAP 217* 207* 118* 107* 170*   HbA1C: Recent Labs    08/24/19 1916  HGBA1C 6.1*   Urine analysis:    Component Value Date/Time   COLORURINE AMBER (A) 08/24/2019 1039   APPEARANCEUR HAZY (A) 08/24/2019 1039   LABSPEC 1.020 08/24/2019 1039   PHURINE 7.0 08/24/2019 1039   GLUCOSEU NEGATIVE 08/24/2019 1039   HGBUR SMALL (A) 08/24/2019 1039   BILIRUBINUR NEGATIVE 08/24/2019 1039   KETONESUR 5 (A) 08/24/2019 1039   PROTEINUR 100 (A) 08/24/2019 1039   UROBILINOGEN 0.2 12/19/2008 2303   NITRITE NEGATIVE 08/24/2019 1039   LEUKOCYTESUR SMALL (A) 08/24/2019 1039   Sepsis Labs: @LABRCNTIP (procalcitonin:4,lacticidven:4) ) Recent Results (from the past 240 hour(s))  Culture, blood (Routine x 2)     Status: Abnormal (Preliminary result)   Collection Time: 08/24/19  7:16 PM   Specimen: Right Antecubital; Blood  Result Value Ref Range Status   Specimen Description   Final    RIGHT ANTECUBITAL Performed at [facility], [address], [place], [place] [address]    Special Requests   Final    BOTTLES DRAWN AEROBIC AND ANAEROBIC Blood Culture adequate volume Performed at [facility], [address], [place], [place] [address]    Culture  Setup Time   Final    GRAM POSITIVE COCCI Gram Stain Report Called to,Read Back By and Verified With: WRIGHT,E@1623  BY MATTHEWS, B 5.20.21 IN BOTH AEROBIC AND  ANAEROBIC BOTTLES CRITICAL RESULT CALLED TO, READ BACK BY AND VERIFIED WITH: TDarrow Bussing 0115 08/26/2019 Mena Goes Performed at [facility], [address] [place], [place] [address]    Culture STREPTOCOCCUS MITIS/ORALIS (A)  Final   Report Status PENDING  Incomplete  Culture, blood (Routine x 2)     Status: Abnormal (Preliminary result)   Collection Time: 08/24/19  7:29 PM   Specimen: Left Antecubital; Blood  Result Value Ref Range Status   Specimen Description   Final    LEFT ANTECUBITAL Performed at [facility], [address], [place], [place] [address]    Special Requests   Final    BOTTLES DRAWN AEROBIC AND ANAEROBIC Blood Culture adequate volume Performed at [facility], [address], [place], [place] [address]    Culture  Setup Time   Final    GRAM POSITIVE COCCI Gram Stain Report Called to,Read Back By and Verified With: WRIGHT,E@1623  BY MATTHEWS, B 5.20.21 IN BOTH AEROBIC AND ANAEROBIC BOTTLES CRITICAL RESULT CALLED TO, READ BACK BY AND VERIFIED WITH: T. Darrow Bussing [id number] 08/26/2019  T. TYSOR    Culture (A)  Final    STREPTOCOCCUS MITIS/ORALIS SUSCEPTIBILITIES TO FOLLOW Performed at [facility], [place] [address] [place], [place] [address]    Report Status PENDING  Incomplete  Blood Culture ID Panel (Reflexed)     Status: Abnormal   Collection Time: 08/24/19  7:29 PM  Result Value Ref Range Status   Enterococcus species NOT DETECTED NOT DETECTED Final   Listeria monocytogenes NOT DETECTED NOT DETECTED Final   Staphylococcus species NOT DETECTED NOT DETECTED Final   Staphylococcus aureus (BCID) NOT DETECTED NOT DETECTED Final   Streptococcus species DETECTED (A) NOT DETECTED Final    Comment: Not Enterococcus species, Streptococcus agalactiae, Streptococcus pyogenes, or Streptococcus pneumoniae. CRITICAL RESULT CALLED TO, READ BACK BY AND VERIFIED WITH: T. MAYO,RN 0115 08/26/2019 T. TYSOR    Streptococcus agalactiae NOT DETECTED NOT DETECTED Final   Streptococcus  pneumoniae NOT DETECTED NOT DETECTED Final   Streptococcus pyogenes NOT DETECTED NOT DETECTED Final   Acinetobacter baumannii NOT DETECTED NOT DETECTED Final   Enterobacteriaceae species NOT DETECTED NOT DETECTED Final   Enterobacter cloacae complex NOT DETECTED NOT DETECTED Final   Escherichia coli NOT DETECTED NOT DETECTED Final   Klebsiella oxytoca NOT DETECTED NOT DETECTED Final   Klebsiella pneumoniae NOT DETECTED NOT DETECTED Final   Proteus species NOT DETECTED NOT DETECTED Final   Serratia marcescens NOT DETECTED NOT DETECTED Final   Haemophilus influenzae NOT DETECTED NOT DETECTED Final   Neisseria meningitidis NOT DETECTED NOT DETECTED Final   Pseudomonas aeruginosa NOT DETECTED NOT DETECTED Final   Candida albicans NOT DETECTED NOT DETECTED Final   Candida glabrata NOT DETECTED NOT DETECTED Final   Candida krusei NOT DETECTED NOT DETECTED Final   Candida parapsilosis NOT DETECTED NOT DETECTED Final   Candida tropicalis NOT DETECTED NOT DETECTED Final    Comment: Performed at [facility], [place] [address] [place], [place] [address]  SARS Coronavirus 2 by RT PCR (hospital order, performed in [facility] hospital lab) Nasopharyngeal Nasopharyngeal Swab     Status: None   Collection Time: 08/24/19  9:27 PM   Specimen: Nasopharyngeal Swab  Result Value Ref Range Status   SARS Coronavirus 2 NEGATIVE NEGATIVE Final    Comment: (NOTE) SARS-CoV-2 target nucleic acids are NOT DETECTED. The SARS-CoV-2 RNA is generally detectable in upper and lower respiratory specimens during the acute phase of infection. The lowest concentration of SARS-CoV-2 viral copies this assay can detect is 250 copies / mL. A negative result does not preclude SARS-CoV-2 infection and should not be used as the sole basis for treatment or other patient management decisions.  A negative result may occur with improper specimen collection / handling, submission of specimen other than nasopharyngeal swab,  presence of viral mutation(s) within the areas targeted by this assay, and inadequate number of viral copies (<250 copies / mL). A negative result must be combined with clinical observations, patient history, and epidemiological information. Fact Sheet for Patients:   StrictlyIdeas.no Fact Sheet for Healthcare Providers: BankingDealers.co.za This test is not yet approved or cleared  by the [place] FDA and has been authorized for detection and/or diagnosis of SARS-CoV-2 by FDA under an Emergency Use Authorization (EUA).  This EUA will remain in effect (meaning this test can be used) for the duration of the COVID-19 declaration under Section 564(b)(1) of the Act, 21 U.S.C. section 360bbb-3(b)(1), unless the authorization is terminated or revoked sooner. Performed at [facility], [address], [place], [place]  [address]      Scheduled Meds: . diltiazem  240 mg Oral Daily  . furosemide  40 mg Intravenous Once  . insulin aspart  0-15 Units Subcutaneous TID WC  . insulin aspart  0-5 Units Subcutaneous QHS  . magnesium oxide  400 mg Oral Daily  . metoprolol succinate  75 mg Oral BID  . multivitamin with minerals  1 tablet Oral Daily  . warfarin  7.5 mg Oral ONCE-1600  . Warfarin - Pharmacist Dosing Inpatient   Does not apply q1600   Continuous Infusions: . cefTRIAXone (ROCEPHIN)  IV      Procedures/Studies: CT CHEST WO CONTRAST  Result Date: 08/25/2019 CLINICAL DATA:  Cough and shortness of breath. History of endometrial carcinoma EXAM: CT CHEST WITHOUT CONTRAST TECHNIQUE: Multidetector CT imaging of the chest was performed following the standard protocol without IV contrast. COMPARISON:  June 24, 2019 FINDINGS: Cardiovascular: There is no evident thoracic aortic aneurysm. There are foci of calcification at multiple sites in visualized great vessels. Note that the right innominate and left common carotid arteries arise as a common  trunk, an anatomic variant. There is aortic atherosclerosis as well as multiple foci of native coronary artery calcification. There is no appreciable pericardial effusion or pericardial thickening. Patient is status post median sternotomy with prior coronary artery bypass grafting. 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 There are nodular opacities throughout the left lobe of the thyroid, largest measuring 1.5 x 1.1 cm. There are scattered subcentimeter mediastinal lymph nodes. No adenopathy is appreciable by size criteria. There is a moderate sized hiatal hernia. Lungs/Pleura: There are free-flowing pleural effusions bilaterally, larger on the right than the left, with apparent compressive atelectasis in each lung base, somewhat more on the right than on the left. The lungs elsewhere are clear. Upper Abdomen: Gallbladder is absent. There is aortic and major mesenteric arterial vascular calcification. Visualized upper abdominal structures otherwise appear unremarkable on this noncontrast enhanced study. Musculoskeletal: Status post median sternotomy. Degenerative change noted in the thoracic spine. Anterior wedging of the T12 vertebral body is noted. No blastic or lytic bone lesions are evident. No chest wall lesions. IMPRESSION: 1. Free-flowing pleural effusions, larger on the right than on the left, with apparent compressive atelectasis in the lung bases. Lungs otherwise clear. 2.  No evident thoracic adenopathy. 3. 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 Mass lesions in left lobe, largest measuring 1.5 cm. In the setting of significant comorbidities or limited life expectancy, no follow-up recommended. Clinical assessment in this specific case regarding additional surveillance with respect to clinical history advised. (Ref: J Am Coll Radiol. 2015 Feb;12(2): 143-50). 4.  Moderate hiatal hernia noted. 5. Aortic atherosclerosis. Foci of great vessel calcification noted. Status post  coronary artery bypass grafting. Native coronary artery calcification noted at multiple sites. Aortic Atherosclerosis (ICD10-I70.0). Electronically Signed   By: Lowella Grip III M.D.   On: 08/25/2019 15:50   US Venous Img Lower Bilateral (DVT)  Result Date: 08/25/2019 CLINICAL DATA:  Bilateral lower extremity edema and pain. EXAM: Bilateral LOWER EXTREMITY VENOUS DOPPLER ULTRASOUND TECHNIQUE: Gray-scale sonography with compression, as well as color and duplex ultrasound, were performed to evaluate the deep venous system(s) from the level of the common femoral vein through the popliteal and proximal calf veins. COMPARISON:  July 02, 2011. FINDINGS: VENOUS Normal compressibility of the common femoral, superficial femoral, and popliteal veins, as well as the visualized calf veins. Visualized portions of profunda femoral vein and great saphenous vein  unremarkable. No filling defects to suggest DVT on grayscale or color Doppler imaging. Doppler waveforms show normal direction of venous flow, normal respiratory plasticity and response to augmentation. Limited views of the contralateral common femoral vein are unremarkable. OTHER None. Limitations: none IMPRESSION: Negative. Electronically Signed   By: Marijo Conception M.D.   On: 08/25/2019 13:20   DG Chest Port 1 View  Result Date: 08/24/2019 CLINICAL DATA:  86 year old female with cough fever and malaise. Test for COVID-19. Pending. EXAM: PORTABLE CHEST 1 VIEW COMPARISON:  Portable chest 09/20/2010 and earlier. FINDINGS: Portable AP upright view at 2020 hours. Chronic cardiomegaly and sequelae of CABG. Calcified aortic atherosclerosis. Stable cardiac size and mediastinal contours. Visualized tracheal air column is within normal limits. Diffuse increased pulmonary interstitial opacity. No pleural fluid identified. No pneumothorax or consolidation. Paucity of bowel gas in the upper abdomen. Chronic degenerative changes at both shoulders. No acute osseous  abnormality identified. IMPRESSION: Chronic cardiomegaly with nonspecific increased pulmonary interstitium. Consider interstitial edema versus viral/atypical respiratory infection. No pleural effusion is evident. Electronically Signed   By: Genevie Ann M.D.   On: 08/24/2019 20:31   ECHOCARDIOGRAM COMPLETE  Result Date: 08/25/2019    ECHOCARDIOGRAM REPORT   Patient Name:   SNEZANA PRESBY [facility] Date of Exam: 08/25/2019 Medical Rec #:  [id number]        Height:       61.0 in Accession #:    [id number]       Weight:       184.1 lb Date of Birth:  Dec 27, 1930        BSA:          1.823 m Patient Age:    77 years         BP:           133/77 mmHg Patient Gender: F                HR:           111 bpm. Exam Location:  [facility] Procedure: 2D Echo, Cardiac Doppler and Color Doppler Indications:    R06.02 SOB  History:        Patient has prior history of Echocardiogram examinations, most                 recent 01/25/2019. CAD, Prior CABG, Pulmonary HTN,                 Arrythmias:Atrial Fibrillation, Signs/Symptoms:Dyspnea; Risk                 Factors:Hypertension, Dyslipidemia and Diabetes. Volume                 overload.  Sonographer:    Roseanna Rainbow RDCS (AE) Referring Phys: [phone] Mylo Driskill IMPRESSIONS  1. Left ventricular ejection fraction, by estimation, is 50 to 55%. The left ventricle has low normal function. The left ventricle has no regional wall motion abnormalities. There is moderate left ventricular hypertrophy. Left ventricular diastolic parameters are indeterminate.  2. Right ventricular systolic function is moderately reduced. The right ventricular size is severely enlarged. There is severely elevated pulmonary artery systolic pressure.  3. Right atrial size was severely dilated.  4. The mitral valve is normal in structure. Trivial mitral valve regurgitation. No evidence of mitral stenosis.  5. Tricuspid valve regurgitation is moderate.  6. The aortic valve is tricuspid. Aortic valve regurgitation is not visualized.  No aortic stenosis is present.  7. Severe pulmonary HTN, PASP is  72 mmHg.  8. The inferior vena cava is dilated in size with <50% respiratory variability, suggesting right atrial pressure of 15 mmHg. FINDINGS  Left Ventricle: Left ventricular ejection fraction, by estimation, is 50 to 55%. The left ventricle has low normal function. The left ventricle has no regional wall motion abnormalities. The left ventricular internal cavity size was normal in size. There is moderate left ventricular hypertrophy. Left ventricular diastolic parameters are indeterminate. Right Ventricle: Ventricular setpum is flattened in systole and diastole suggesting RV pressure and volume overload. The right ventricular size is severely enlarged. Right vetricular wall thickness was not assessed. Right ventricular systolic function is  moderately reduced. There is severely elevated pulmonary artery systolic pressure. The tricuspid regurgitant velocity is 3.77 m/s, and with an assumed right atrial pressure of 15 mmHg, the estimated right ventricular systolic pressure is [id number]. Left Atrium: Left atrial size was normal in size. Right Atrium: Right atrial size was severely dilated. Pericardium: There is no evidence of pericardial effusion. Mitral Valve: The mitral valve is normal in structure. There is mild thickening of the mitral valve leaflet(s). There is mild calcification of the mitral valve leaflet(s). Mild mitral annular calcification. Trivial mitral valve regurgitation. No evidence  of mitral valve stenosis. Tricuspid Valve: The tricuspid valve is normal in structure. Tricuspid valve regurgitation is moderate . No evidence of tricuspid stenosis. Aortic Valve: The aortic valve is tricuspid. . There is moderate thickening and moderate calcification of the aortic valve. Aortic valve regurgitation is not visualized. No aortic stenosis is present. Moderate aortic valve annular calcification. There is  moderate thickening of the aortic  valve. There is moderate calcification of the aortic valve. Aortic valve mean gradient measures 4.8 mmHg. Aortic valve peak gradient measures 7.7 mmHg. Aortic valve area, by VTI measures 2.41 cm. Pulmonic Valve: The pulmonic valve was not well visualized. Pulmonic valve regurgitation is mild. No evidence of pulmonic stenosis. Aorta: The aortic root is normal in size and structure. Pulmonary Artery: Severe pulmonary HTN, PASP is 72 mmHg. Venous: The inferior vena cava is dilated in size with less than 50% respiratory variability, suggesting right atrial pressure of 15 mmHg. IAS/Shunts: No atrial level shunt detected by color flow Doppler.  LEFT VENTRICLE PLAX 2D LVIDd:         4.52 cm LVIDs:         3.36 cm LV PW:         1.40 cm LV IVS:        1.36 cm LVOT diam:     2.40 cm LV SV:         75 LV SV Index:   41 LVOT Area:     4.52 cm  LV Volumes (MOD) LV vol d, MOD A2C: 101.0 ml LV vol d, MOD A4C: 57.0 ml LV vol s, MOD A2C: 54.8 ml LV vol s, MOD A4C: 32.7 ml LV SV MOD A2C:     46.2 ml LV SV MOD A4C:     57.0 ml LV SV MOD BP:      35.2 ml RIGHT VENTRICLE         IVC TAPSE (M-mode): 0.5 cm  IVC diam: 2.53 cm LEFT ATRIUM             Index       RIGHT ATRIUM           Index LA diam:        4.00 cm 2.19 cm/m  RA Area:     25.40  cm LA Vol (A2C):   72.5 ml 39.76 ml/m RA Volume:   84.20 ml  46.18 ml/m LA Vol (A4C):   48.4 ml 26.54 ml/m LA Biplane Vol: 62.4 ml 34.22 ml/m  AORTIC VALVE                    PULMONIC VALVE AV Area (Vmax):    2.86 cm     PV Vmax:          3.70 m/s AV Area (Vmean):   2.48 cm     PV Peak grad:     54.8 mmHg AV Area (VTI):     2.41 cm     PR End Diast Vel: 1.25 msec AV Vmax:           139.13 cm/s AV Vmean:          102.758 cm/s AV VTI:            0.311 m AV Peak Grad:      7.7 mmHg AV Mean Grad:      4.8 mmHg LVOT Vmax:         87.90 cm/s LVOT Vmean:        56.300 cm/s LVOT VTI:          0.166 m LVOT/AV VTI ratio: 0.53  AORTA Ao Root diam: 3.30 cm Ao Asc diam:  3.30 cm MITRAL VALVE                 TRICUSPID VALVE MV Area (PHT): 4.65 cm     TR Peak grad:   56.9 mmHg MV Decel Time: 163 msec     TR Vmax:        377.00 cm/s MV E velocity: 114.67 cm/s                             SHUNTS                             Systemic VTI:  0.17 m                             Systemic Diam: 2.40 cm Carlyle Dolly MD Electronically signed by Carlyle Dolly MD Signature Date/Time: 08/25/2019/3:16:20 PM    Final    OCT, Retina - OU - Both Eyes  Result Date: 08/05/2019 Right Eye Quality was good. Central Foveal Thickness: 263. Progression has improved. Findings include abnormal foveal contour, intraretinal fluid, no SRF (Mild interval improvement in IRF-temporal fovea). Left Eye Quality was good. Central Foveal Thickness: 230. Progression has been stable. Findings include normal foveal contour, intraretinal fluid, no SRF (Persistent Mild cystic changes). Notes *Images captured and stored on drive Diagnosis / Impression: OD: Mild interval improvement in IRF temporal fovea OS: NFP; +cystic changes / DME; no SRF Clinical management: See below Abbreviations: NFP - Normal foveal profile. CME - cystoid macular edema. PED - pigment epithelial detachment. IRF - intraretinal fluid. SRF - subretinal fluid. EZ - ellipsoid zone. ERM - epiretinal membrane. ORA - outer retinal atrophy. ORT - outer retinal tubulation. SRHM - subretinal hyper-reflective material   Fluorescein Angiography Optos (Transit OS)  Result Date: 08/05/2019 Right Eye Progression has no prior data. Early phase findings include vascular perfusion defect, microaneurysm, blockage, leakage. Mid/Late phase findings include blockage. Left Eye Progression has no prior data. Early phase findings include blockage, microaneurysm,  vascular perfusion defect, staining, leakage. Mid/Late phase findings include blockage, staining, microaneurysm, leakage. Notes **Images stored on drive** Impression: Moderate NPDR OU Late leaking MA Scattered perfusion defect OU No NV OU    US Abdomen Limited RUQ  Result Date: 08/25/2019 CLINICAL DATA:  Elevated LFTs.  Previous cholecystectomy. EXAM: ULTRASOUND ABDOMEN LIMITED RIGHT UPPER QUADRANT COMPARISON:  None. FINDINGS: Gallbladder: Prior cholecystectomy Common bile duct: Diameter: 4.6 mm. Liver: No focal lesion identified. Within normal limits in parenchymal echogenicity. Mild coarse increased echogenicity compatible with a degree of steatosis without focal mass. Other: None. IMPRESSION: 1.  No acute findings. 2.  Previous cholecystectomy. 3.  Suggestion of a degree of hepatic steatosis without focal mass. Electronically Signed   By: Marin Olp M.D.   On: 08/25/2019 13:15    Orson Eva, DO  [facility]  If 7PM-7AM, please contact night-coverage [email or web address] Password [email or web address] 08/26/2019, 1:29 PM   LOS: 1 day

## 2019-08-26 NOTE — Evaluation (Signed)
Physical Therapy Evaluation Patient Details Name: [name] MRN: [id number] DOB: 1930-06-27 Today's Date: 08/26/2019   History of Present Illness  [name] is a 86 y.o. female with medical history significant of pain, hyperlipidemia, atrial fibrillation, coronary artery disease s/p CABG and diabetes mellitus presented to ED for evaluation of worsening cough with generalized weakness.  Patient states that the cough started today as a dry cough and it continue to worsen and become productive but no hemoptysis.  Patient states she is also having fevers with chills but denies shortness of breath, sore throat, chest pain, loss of taste and smell sensation and contact with any Covid positive patient.  Patient also denies nausea, vomiting, diarrhea, abdominal pain and urinary symptoms.  Patient further mentioned that she has been fully vaccinated for COVID-19.    Clinical Impression  Patient functioning at baseline for functional mobility and gait. Patient completes bed mobility and transfers without physical assist and with slow, labored movements. She requires use of bed rails for bed mobility but she uses those at home as well. She uses her RW for transfers and ambulation with slow cadence without loss of balance with guard for safety. Patient fatigues with ambulation and ends session in bed with family present. Patient discharged to care of nursing for ambulation daily as tolerated for length of stay.      Follow Up Recommendations No PT follow up    Equipment Recommendations  None recommended by PT    Recommendations for Other Services       Precautions / Restrictions Precautions Precautions: Fall Restrictions Weight Bearing Restrictions: No      Mobility  Bed Mobility Overal bed mobility: Modified Independent             General bed mobility comments: slow, labored  Transfers Overall transfer level: Needs assistance Equipment used: Rolling walker (2  wheeled) Transfers: Sit to/from [facility] Sit to Stand: Supervision Stand pivot transfers: Supervision          Ambulation/Gait Ambulation/Gait assistance: Min guard Gait Distance (Feet): 100 Feet Assistive device: Rolling walker (2 wheeled) Gait Pattern/deviations: Step-through pattern;Decreased stride length Gait velocity: decreased   General Gait Details: slow, labored with use of RW, guard for safety/balance  Stairs            Wheelchair Mobility    Modified Rankin (Stroke Patients Only)       Balance Overall balance assessment: Mild deficits observed, not formally tested                                           Pertinent Vitals/Pain Pain Assessment: No/denies pain    Home Living Family/patient expects to be discharged to:: Private residence Living Arrangements: Alone Available Help at Discharge: Family Type of Home: House Home Access: Ramped entrance     Home Layout: One level Home Equipment: Environmental consultant - 2 wheels;Shower seat - built in      Prior Function Level of Independence: Independent with assistive device(s)         Comments: Patient states household ambulator with RW, independent with basic ADL, has someone who does yard work, Tourist information centre manager        Extremity/Trunk Assessment   Upper Extremity Assessment Upper Extremity Assessment: Overall WFL for tasks assessed    Lower Extremity Assessment Lower Extremity Assessment: Overall WFL for tasks assessed  Cervical / Trunk Assessment Cervical / Trunk Assessment: Normal  Communication   Communication: No difficulties  Cognition Arousal/Alertness: Awake/alert Behavior During Therapy: WFL for tasks assessed/performed Overall Cognitive Status: Within Functional Limits for tasks assessed                                        General Comments      Exercises     Assessment/Plan    PT Assessment Patent does not  need any further PT services  PT Problem List         PT Treatment Interventions      PT Goals (Current goals can be found in the Care Plan section)  Acute Rehab PT Goals Patient Stated Goal: return home PT Goal Formulation: With patient Time For Goal Achievement: 08/26/19 Potential to Achieve Goals: Good    Frequency     Barriers to discharge        Co-evaluation               AM-PAC PT "6 Clicks" Mobility  Outcome Measure Help needed turning from your back to your side while in a flat bed without using bedrails?: None Help needed moving from lying on your back to sitting on the side of a flat bed without using bedrails?: A Little Help needed moving to and from a bed to a chair (including a wheelchair)?: None Help needed standing up from a chair using your arms (e.g., wheelchair or bedside chair)?: None Help needed to walk in hospital room?: None Help needed climbing 3-5 steps with a railing? : A Little 6 Click Score: 22    End of Session Equipment Utilized During Treatment: Gait belt Activity Tolerance: Patient tolerated treatment well Patient left: in bed;with family/visitor present;with call bell/phone within reach;with bed alarm set Nurse Communication: Mobility status PT Visit Diagnosis: Other abnormalities of gait and mobility (R26.89);Muscle weakness (generalized) (M62.81);Unsteadiness on feet (R26.81)    Time: [id number] PT Time Calculation (min) (ACUTE ONLY): 17 min   Charges:   PT Evaluation $PT Eval Low Complexity: 1 Low PT Treatments $Therapeutic Activity: 8-22 mins        3:30 PM, 08/26/19 Mearl Latin PT, DPT Physical Therapist at [facility]

## 2019-08-26 NOTE — Progress Notes (Addendum)
ANTICOAGULATION CONSULT NOTE - Initial Consult  Pharmacy Consult for warfarin Indication: atrial fibrillation  Allergies  Allergen Reactions  . Statins Other (See Comments)    No energy, fatigue.     Patient Measurements: Height: 5\' 1"  (154.9 cm) Weight: 83.5 kg (184 lb 1.4 oz) IBW/kg (Calculated) : 47.8   Vital Signs: Temp: 98.4 F (36.9 C) (05/21 0511) Temp Source: Oral (05/21 0511) BP: 126/68 (05/21 0511) Pulse Rate: 67 (05/21 0511)  Labs: Recent Labs    08/24/19 1916 08/24/19 1916 08/25/19 0449 08/25/19 1034 08/26/19 0450  HGB 11.2*   < > 10.6*  --  9.9*  HCT 34.5*  --  32.3*  --  30.9*  PLT 169  --  138*  --  121*  LABPROT 22.7*  --   --  21.3* 19.7*  INR 2.1*  --   --  1.9* 1.7*  CREATININE 1.00  --  1.04*  --  1.19*   < > = values in this interval not displayed.    Estimated Creatinine Clearance: 33.3 mL/min (A) (by C-G formula based on SCr of 1.19 mg/dL (H)).   Assessment: Pharmacy consulted to dose warfarin for patient with atrial fibrillation. Patient's home dose verified today as 5mg  every other day alternating with 7.5mg  every other day.  5/21 update: INR 1.7 H/H: 9.9/30.9   Plates [id number]    Goal of Therapy:  INR 2-3 Monitor platelets by anticoagulation protocol: Yes   Plan:  Give warfarin 7.5 mg x 1 dose today for INR 1.7 Monitor daily INR and s/s of bleeding.    Despina Pole, Pharm. D. Clinical Pharmacist 08/26/2019 10:34 AM

## 2019-08-27 ENCOUNTER — Inpatient Hospital Stay: Payer: Self-pay

## 2019-08-27 ENCOUNTER — Inpatient Hospital Stay (HOSPITAL_COMMUNITY): Payer: Medicare Other

## 2019-08-27 DIAGNOSIS — R7881 Bacteremia: Secondary | ICD-10-CM

## 2019-08-27 DIAGNOSIS — B955 Unspecified streptococcus as the cause of diseases classified elsewhere: Secondary | ICD-10-CM

## 2019-08-27 LAB — BASIC METABOLIC PANEL
Anion gap: 10 (ref 5–15)
BUN: 29 mg/dL — ABNORMAL HIGH (ref 8–23)
CO2: 24 mmol/L (ref 22–32)
Calcium: 8.1 mg/dL — ABNORMAL LOW (ref 8.9–10.3)
Chloride: 100 mmol/L (ref 98–111)
Creatinine, Ser: 0.95 mg/dL (ref 0.44–1.00)
GFR calc Af Amer: >60 mL/min (ref >60)
GFR calc non Af Amer: 54 mL/min — ABNORMAL LOW (ref >60)
Glucose, Bld: 135 mg/dL — ABNORMAL HIGH (ref 70–99)
Potassium: 3.4 mmol/L — ABNORMAL LOW (ref 3.5–5.1)
Sodium: 134 mmol/L — ABNORMAL LOW (ref 135–145)

## 2019-08-27 LAB — CULTURE, BLOOD (ROUTINE X 2)
Special Requests: ADEQUATE
Special Requests: ADEQUATE

## 2019-08-27 LAB — PROTIME-INR
INR: 1.4 — ABNORMAL HIGH (ref 0.8–1.2)
Prothrombin Time: 17.1 seconds — ABNORMAL HIGH (ref 11.4–15.2)

## 2019-08-27 LAB — CBC
HCT: 30.3 % — ABNORMAL LOW (ref 36.0–46.0)
Hemoglobin: 9.9 g/dL — ABNORMAL LOW (ref 12.0–15.0)
MCH: 30.3 pg (ref 26.0–34.0)
MCHC: 32.7 g/dL (ref 30.0–36.0)
MCV: 92.7 fL (ref 80.0–100.0)
Platelets: 134 10*3/uL — ABNORMAL LOW (ref 150–400)
RBC: 3.27 MIL/uL — ABNORMAL LOW (ref 3.87–5.11)
RDW: 13.3 % (ref 11.5–15.5)
WBC: 6.7 10*3/uL (ref 4.0–10.5)
nRBC: 0 % (ref 0.0–0.2)

## 2019-08-27 LAB — GLUCOSE, CAPILLARY
Glucose-Capillary: 153 mg/dL — ABNORMAL HIGH (ref 70–99)
Glucose-Capillary: 146 mg/dL — ABNORMAL HIGH (ref 70–99)

## 2019-08-27 LAB — MAGNESIUM: Magnesium: 2 mg/dL (ref 1.7–2.4)

## 2019-08-27 MED ORDER — CHLORHEXIDINE GLUCONATE CLOTH 2 % EX PADS
6.0000 | MEDICATED_PAD | Freq: Every day | CUTANEOUS | Status: DC
  Administered 2019-08-27: 6 via TOPICAL

## 2019-08-27 MED ORDER — WARFARIN SODIUM 7.5 MG PO TABS
7.5000 mg | ORAL_TABLET | Freq: Once | ORAL | Status: AC
  Administered 2019-08-27: 7.5 mg via ORAL
  Filled 2019-08-27: qty 1

## 2019-08-27 MED ORDER — CEFTRIAXONE IV (FOR PTA / DISCHARGE USE ONLY)
2.0000 g | Freq: Every day | INTRAVENOUS | 0 refills | Status: AC
Start: 2019-08-28 — End: 2019-09-04

## 2019-08-27 MED ORDER — FUROSEMIDE 40 MG PO TABS: 40.0000 mg | ORAL_TABLET | Freq: Every day | ORAL | 1 refills | Status: DC

## 2019-08-27 MED ORDER — SODIUM CHLORIDE 0.9% FLUSH: 10.0000 mL | INTRAVENOUS | Status: DC | PRN

## 2019-08-27 MED ORDER — FUROSEMIDE 40 MG PO TABS
40.0000 mg | ORAL_TABLET | Freq: Every day | ORAL | Status: DC
  Administered 2019-08-27: 40 mg via ORAL
  Filled 2019-08-27: qty 1

## 2019-08-27 MED ORDER — SODIUM CHLORIDE 0.9% FLUSH
10.0000 mL | Freq: Two times a day (BID) | INTRAVENOUS | Status: DC
  Administered 2019-08-27: 10 mL

## 2019-08-27 NOTE — Progress Notes (Signed)
Peripherally Inserted Central Catheter Placement  The IV Nurse has discussed with the patient and/or persons authorized to consent for the patient, the purpose of this procedure and the potential benefits and risks involved with this procedure.  The benefits include less needle sticks, lab draws from the catheter, and the patient may be discharged home with the catheter. Risks include, but not limited to, infection, bleeding, blood clot (thrombus formation), and puncture of an artery; nerve damage and irregular heartbeat and possibility to perform a PICC exchange if needed/ordered by physician.  Alternatives to this procedure were also discussed.  Bard Power PICC patient education guide, fact sheet on infection prevention and patient information card has been provided to patient /or left at bedside.    PICC Placement Documentation  PICC Single Lumen 08/27/19 PICC Right Cephalic 39 cm 0 cm (Active)  Indication for Insertion or Continuance of Line Home intravenous therapies (PICC only) 08/27/19 1434  Exposed Catheter (cm) 0 cm 08/27/19 1434  Site Assessment Clean;Dry;Intact 08/27/19 1434  Line Status Blood return noted;Flushed;Saline locked 08/27/19 1434  Dressing Type Transparent 08/27/19 1434  Dressing Status Clean;Dry;Intact;Antimicrobial disc in place 08/27/19 1434  Safety Lock Not Applicable [id number]  Line Care Connections checked and tightened 08/27/19 1434  Dressing Change Due 09/03/19 08/27/19 [place], Jerritt Cardoza M 08/27/2019, 2:44 PM

## 2019-08-27 NOTE — Progress Notes (Signed)
Contacted by Jeanice Lim, RN -suggested to defer PICC due to bacteremia -Blood cultures positive on 08/24/19 with Strep mitis -Echo neg -patient improved clinically without any sepsis physiology -No clear indication to repeat blood cultures or defer PICC line at this time  DTat

## 2019-08-27 NOTE — Discharge Summary (Addendum)
Physician Discharge Summary  [name] QBV:[id number] DOB: March 04, 1931 DOA: 08/24/2019  PCP: Neale Burly, MD  Admit date: 08/24/2019 Discharge date: 08/27/2019  Admitted From: Home Disposition:  Home   Recommendations for Outpatient Follow-up:  1. Follow up with PCP in 1-2 weeks 2. Please obtain BMP/CBC in one week 3. Ceftriaxone 2 grams daily--last dose on 09/04/19 4. Discontinue PICC line after last dose IV antibiotics on 09/04/19   Home Health: YES Equipment/Devices: IV antibiotics  Discharge Condition: Stable CODE STATUS: FULL Diet recommendation: Heart Healthy    Brief/Interim Summary: 86 year old female with a history of permanent atrial fibrillation, pulmonary hypertension, coronary disease, hypertension, hyperlipidemia, diabetes mellitus type 2, endometrial carcinoma presenting with 1 day history of shortness of breath and nonproductive cough. She denies any fevers, chills, chest pain, abdominal pain, nausea, vomiting, diarrhea. There is no hemoptysis. She has not been start any new medications. She states that she has chronic lower extremity edema. She states that this is 90 worse than usual. Upon presentation, the patient was noted to have a fever up to 101.9 F, but she was hemodynamically stable. The patient was started on ceftriaxone and azithromycin.  This was narrowed to ceftriaxone 2 grams daily for Strep mitis bacteremia.  PICC line was placed and she will go home with 7 more days of ceftriaxone  Discharge Diagnoses:  Bacteremia--Strep mitis -Continue ceftriaxone--increase to 2 grams daily -place PICC -d/c home with ceftriaxone x 7 more days to complete 10 days total -d/c azithromycin -Personally reviewed chest x-ray--Increased interstitial markings -Procalcitonin 1.97 -CT chest--bilateral pleural effusions R>L; left thyroid nodule  Acute diastolic CHF -repeat IV lasix 40 mg>>d/c home with lasix 40 mg po daily -08/25/19 Echo--EF 50-55%, no WMA,  mod decrease RV function; severe elevated PASP -daily weights--not accurate -accurate I/Os--incomplete -d/c home with lasix 40 mg po daily  Permanent atrial fibrillation -Rate controlled -Continue diltiazem -Continue warfarin  Cellulitis right lower extremity -Venous duplex--neg -Continue ceftriaxone  Essential hypertension -Continue diltiazem CD -continue metoprolol succinate  Diabetes mellitus type 2, controlled -08/24/2019 hemoglobin A1c 6.1 -NovoLog sliding scale--discontinue -Holding Metformin--restart after d/c -holding glipizide--restart after d/c  Transaminasemia -likely due to chronic hepatic congestion from pulm HTN -RUQ ultrasound s/p chole; hepatic steatosis    Discharge Instructions  Discharge Instructions    [facility] pharmacist to adjust dose for Vancomycin, Aminoglycosides and other anti-infective therapies as requested by physician.   Complete by: As directed    [facility] to provide Cath Flo 69m   Complete by: As directed    Administer for PICC line occlusion and as ordered by physician for other access device issues.   Anaphylaxis Kit: Provided to treat any anaphylactic reaction to the medication being provided to the patient if First Dose or when requested by physician   Complete by: As directed    Epinephrine 142mml vial / amp: Administer 0.30m38m0.30ml69mubcutaneously once for moderate to severe anaphylaxis, nurse to call physician and pharmacy when reaction occurs and call 911 if needed for immediate care   Diphenhydramine 50mg44mIV vial: Administer 25-50mg 38mM PRN for first dose reaction, rash, itching, mild reaction, nurse to call physician and pharmacy when reaction occurs   Sodium Chloride 0.9% NS 500ml I32mdminister if needed for hypovolemic blood pressure drop or as ordered by physician after call to physician with anaphylactic reaction   Change dressing on IV access line weekly and PRN   Complete by: As  directed    Flush IV access with Sodium Chloride  0.9% and Heparin 10 units/ml or 100 units/ml   Complete by: As directed    Home infusion instructions - [facility]   Complete by: As directed    Instructions: Flush IV access with Sodium Chloride 0.9% and Heparin 10units/ml or 100units/ml   Change dressing on IV access line: Weekly and PRN   Instructions Cath Flo 61m: Administer for PICC Line occlusion and as ordered by physician for other access device   [facility] pharmacist to adjust dose for: Vancomycin, Aminoglycosides and other anti-infective therapies as requested by physician   Method of administration may be changed at the discretion of home infusion pharmacist based upon assessment of the patient and/or caregiver's ability to self-administer the medication ordered   Complete by: As directed    Outpatient Parenteral Antibiotic Therapy Information Antibiotic: Ceftriaxone (Rocephin) IVPB; Indications for use: Bacteremia--ceftriaxone 2 grams daily; End Date: 09/04/2019   Complete by: As directed    Antibiotic: Ceftriaxone (Rocephin) IVPB   Indications for use: Bacteremia--ceftriaxone 2 grams daily   End Date: 09/04/2019     Allergies as of 08/27/2019      Reactions   Statins Other (See Comments)   No energy, fatigue.       Medication List    TAKE these medications   alendronate 70 MG tablet Commonly known as: FOSAMAX Take 70 mg by mouth once a week. Take with a full glass of water on an empty stomach.   CALCIUM 500 PO Take 500 mg by mouth 2 (two) times daily.   cefTRIAXone  IVPB Commonly known as: ROCEPHIN Inject 2 g into the vein daily for 7 days. Indication:  S. mitis bacteremia First Dose: Yes Last Day of Therapy:  5/30//21 Labs - Once weekly:  CBC/D and BMP, Labs - Every other week:  ESR and CRP Method of administration: IVPB Method of administration may be changed at the discretion of home infusion pharmacist based upon assessment of the  patient and/or caregiver's ability to self-administer the medication ordered. Start taking on: Aug 28, 2019   denosumab 60 MG/ML Sosy injection Commonly known as: PROLIA Inject 60 mg into the skin every 6 (six) months.   diltiazem 240 MG 24 hr capsule Commonly known as: CARDIZEM CD Take 240 mg by mouth daily.   Fish Oil 1200 MG Caps Take 1,200 mg by mouth 2 (two) times daily.   furosemide 40 MG tablet Commonly known as: LASIX Take 1 tablet (40 mg total) by mouth daily. What changed:   medication strength  how much to take   glipiZIDE 5 MG tablet Commonly known as: GLUCOTROL Take 5 mg by mouth daily.   Glucosamine 500 MG Caps Take 500 mg by mouth 2 (two) times daily.   HYDROcodone-acetaminophen 5-325 MG tablet Commonly known as: NORCO/VICODIN Take 1 tablet by mouth every 8 (eight) hours as needed for moderate pain.   magnesium oxide 400 MG tablet Commonly known as: MAG-OX Take 400 mg by mouth daily.   metFORMIN 500 MG tablet Commonly known as: GLUCOPHAGE Take 500 mg by mouth at bedtime. Once daily at bedtime   metoprolol succinate 50 MG 24 hr tablet Commonly known as: TOPROL-XL TAKE 1 AND 1/2 TABLETS BY MOUTH TWICE DAILY   multivitamin tablet Take 1 tablet by mouth daily.   sodium bicarbonate 650 MG tablet Take 650 mg by mouth 3 (three) times daily.   TYLENOL PM EXTRA STRENGTH PO Take 1 tablet by mouth at bedtime as needed. Sleep   VITAMIN D PO  Take 400 Units by mouth daily.   warfarin 5 MG tablet Commonly known as: COUMADIN Take 5 mg by mouth daily. 7.63m alt w/ 540mevery other day PER [name] OFFICE.            Discharge Care Instructions  (From admission, onward)         Start     Ordered   08/27/19 0000  Change dressing on IV access line weekly and PRN  (Home infusion instructions - [facility] )     08/27/19 1158         Follow-up Information    Nida, GeMarella ChimesMD In 1 week.   Specialty: Endocrinology Why: Call  office to schedule appointment. If you do not hear from the offfice after 1 week please call,. Contact information: [phone]        Allergies  Allergen Reactions  . Statins Other (See Comments)    No energy, fatigue.     Consultations:  none   Procedures/Studies: CT CHEST WO CONTRAST  Result Date: 08/25/2019 CLINICAL DATA:  Cough and shortness of breath. History of endometrial carcinoma EXAM: CT CHEST WITHOUT CONTRAST TECHNIQUE: Multidetector CT imaging of the chest was performed following the standard protocol without IV contrast. COMPARISON:  June 24, 2019 FINDINGS: Cardiovascular: There is no evident thoracic aortic aneurysm. There are foci of calcification at multiple sites in visualized great vessels. Note that the right innominate and left common carotid arteries arise as a common trunk, an anatomic variant. There is aortic atherosclerosis as well as multiple foci of native coronary artery calcification. There is no appreciable pericardial effusion or pericardial thickening. Patient is status post median sternotomy with prior coronary artery bypass grafting. 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 There are nodular opacities throughout the left lobe of the thyroid, largest measuring 1.5 x 1.1 cm. There are scattered subcentimeter mediastinal lymph nodes. No adenopathy is appreciable by size criteria. There is a moderate sized hiatal hernia. Lungs/Pleura: There are free-flowing pleural effusions bilaterally, larger on the right than the left, with apparent compressive atelectasis in each lung base, somewhat more on the right than on the left. The lungs elsewhere are clear. Upper Abdomen: Gallbladder is absent. There is aortic and major mesenteric arterial vascular calcification. Visualized upper abdominal structures otherwise appear unremarkable on this noncontrast enhanced study. Musculoskeletal: Status post median  sternotomy. Degenerative change noted in the thoracic spine. Anterior wedging of the T12 vertebral body is noted. No blastic or lytic bone lesions are evident. No chest wall lesions. IMPRESSION: 1. Free-flowing pleural effusions, larger on the right than on the left, with apparent compressive atelectasis in the lung bases. Lungs otherwise clear. 2.  No evident thoracic adenopathy. 3. 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 Mass lesions in left lobe, largest measuring 1.5 cm. In the setting of significant comorbidities or limited life expectancy, no follow-up recommended. Clinical assessment in this specific case regarding additional surveillance with respect to clinical history advised. (Ref: J Am Coll Radiol. 2015 Feb;12(2): 143-50). 4.  Moderate hiatal hernia noted. 5. Aortic atherosclerosis. Foci of great vessel calcification noted. Status post coronary artery bypass grafting. Native coronary artery calcification noted at multiple sites. Aortic Atherosclerosis (ICD10-I70.0). Electronically Signed   By: WiLowella GripII M.D.   On: 08/25/2019 15:50   [place] Img Lower Bilateral (DVT)  Result Date: 08/25/2019 CLINICAL DATA:  Bilateral lower extremity edema and pain. EXAM: Bilateral  LOWER EXTREMITY VENOUS DOPPLER ULTRASOUND TECHNIQUE: Gray-scale sonography with compression, as well as color and duplex ultrasound, were performed to evaluate the deep venous system(s) from the level of the common femoral vein through the popliteal and proximal calf veins. COMPARISON:  July 02, 2011. FINDINGS: VENOUS Normal compressibility of the common femoral, superficial femoral, and popliteal veins, as well as the visualized calf veins. Visualized portions of profunda femoral vein and great saphenous vein unremarkable. No filling defects to suggest DVT on grayscale or color Doppler imaging. Doppler waveforms show normal direction of venous flow, normal respiratory plasticity and response to augmentation.  Limited views of the contralateral common femoral vein are unremarkable. OTHER None. Limitations: none IMPRESSION: Negative. Electronically Signed   By: Marijo Conception M.D.   On: 08/25/2019 13:20   DG Chest Port 1 View  Result Date: 08/24/2019 CLINICAL DATA:  86 year old female with cough fever and malaise. Test for COVID-19. Pending. EXAM: PORTABLE CHEST 1 VIEW COMPARISON:  Portable chest 09/20/2010 and earlier. FINDINGS: Portable AP upright view at 2020 hours. Chronic cardiomegaly and sequelae of CABG. Calcified aortic atherosclerosis. Stable cardiac size and mediastinal contours. Visualized tracheal air column is within normal limits. Diffuse increased pulmonary interstitial opacity. No pleural fluid identified. No pneumothorax or consolidation. Paucity of bowel gas in the upper abdomen. Chronic degenerative changes at both shoulders. No acute osseous abnormality identified. IMPRESSION: Chronic cardiomegaly with nonspecific increased pulmonary interstitium. Consider interstitial edema versus viral/atypical respiratory infection. No pleural effusion is evident. Electronically Signed   By: Genevie Ann M.D.   On: 08/24/2019 20:31   ECHOCARDIOGRAM COMPLETE  Result Date: 08/25/2019    ECHOCARDIOGRAM REPORT   Patient Name:   LYLIA KARN [facility] ([place] Date of Exam: 08/25/2019 Medical Rec #:  [id number]        Height:       61.0 in Accession #:    [id number]       Weight:       184.1 lb Date of Birth:  October 24, 1930        BSA:          1.823 m Patient Age:    18 years         BP:           133/77 mmHg Patient Gender: F                HR:           111 bpm. Exam Location:  [facility] Procedure: 2D Echo, Cardiac Doppler and Color Doppler Indications:    R06.02 SOB  History:        Patient has prior history of Echocardiogram examinations, most                 recent 01/25/2019. CAD, Prior CABG, Pulmonary HTN,                 Arrythmias:Atrial Fibrillation, Signs/Symptoms:Dyspnea; Risk                 Factors:Hypertension,  Dyslipidemia and Diabetes. Volume                 overload.  Sonographer:    Roseanna Rainbow RDCS (AE) Referring Phys: [phone] Juliette Standre IMPRESSIONS  1. Left ventricular ejection fraction, by estimation, is 50 to 55%. The left ventricle has low normal function. The left ventricle has no regional wall motion abnormalities. There is moderate left ventricular hypertrophy. Left ventricular diastolic parameters are indeterminate.  2. Right ventricular systolic function is  moderately reduced. The right ventricular size is severely enlarged. There is severely elevated pulmonary artery systolic pressure.  3. Right atrial size was severely dilated.  4. The mitral valve is normal in structure. Trivial mitral valve regurgitation. No evidence of mitral stenosis.  5. Tricuspid valve regurgitation is moderate.  6. The aortic valve is tricuspid. Aortic valve regurgitation is not visualized. No aortic stenosis is present.  7. Severe pulmonary HTN, PASP is 72 mmHg.  8. The inferior vena cava is dilated in size with <50% respiratory variability, suggesting right atrial pressure of 15 mmHg. FINDINGS  Left Ventricle: Left ventricular ejection fraction, by estimation, is 50 to 55%. The left ventricle has low normal function. The left ventricle has no regional wall motion abnormalities. The left ventricular internal cavity size was normal in size. There is moderate left ventricular hypertrophy. Left ventricular diastolic parameters are indeterminate. Right Ventricle: Ventricular setpum is flattened in systole and diastole suggesting RV pressure and volume overload. The right ventricular size is severely enlarged. Right vetricular wall thickness was not assessed. Right ventricular systolic function is  moderately reduced. There is severely elevated pulmonary artery systolic pressure. The tricuspid regurgitant velocity is 3.77 m/s, and with an assumed right atrial pressure of 15 mmHg, the estimated right ventricular systolic pressure is 03.5 mmHg.  Left Atrium: Left atrial size was normal in size. Right Atrium: Right atrial size was severely dilated. Pericardium: There is no evidence of pericardial effusion. Mitral Valve: The mitral valve is normal in structure. There is mild thickening of the mitral valve leaflet(s). There is mild calcification of the mitral valve leaflet(s). Mild mitral annular calcification. Trivial mitral valve regurgitation. No evidence  of mitral valve stenosis. Tricuspid Valve: The tricuspid valve is normal in structure. Tricuspid valve regurgitation is moderate . No evidence of tricuspid stenosis. Aortic Valve: The aortic valve is tricuspid. . There is moderate thickening and moderate calcification of the aortic valve. Aortic valve regurgitation is not visualized. No aortic stenosis is present. Moderate aortic valve annular calcification. There is  moderate thickening of the aortic valve. There is moderate calcification of the aortic valve. Aortic valve mean gradient measures 4.8 mmHg. Aortic valve peak gradient measures 7.7 mmHg. Aortic valve area, by VTI measures 2.41 cm. Pulmonic Valve: The pulmonic valve was not well visualized. Pulmonic valve regurgitation is mild. No evidence of pulmonic stenosis. Aorta: The aortic root is normal in size and structure. Pulmonary Artery: Severe pulmonary HTN, PASP is 72 mmHg. Venous: The inferior vena cava is dilated in size with less than 50% respiratory variability, suggesting right atrial pressure of 15 mmHg. IAS/Shunts: No atrial level shunt detected by color flow Doppler.  LEFT VENTRICLE PLAX 2D LVIDd:         4.52 cm LVIDs:         3.36 cm LV PW:         1.40 cm LV IVS:        1.36 cm LVOT diam:     2.40 cm LV SV:         75 LV SV Index:   41 LVOT Area:     4.52 cm  LV Volumes (MOD) LV vol d, MOD A2C: 101.0 ml LV vol d, MOD A4C: 57.0 ml LV vol s, MOD A2C: 54.8 ml LV vol s, MOD A4C: 32.7 ml LV SV MOD A2C:     46.2 ml LV SV MOD A4C:     57.0 ml LV SV MOD BP:      35.2 ml  RIGHT VENTRICLE          IVC TAPSE (M-mode): 0.5 cm  IVC diam: 2.53 cm LEFT ATRIUM             Index       RIGHT ATRIUM           Index LA diam:        4.00 cm 2.19 cm/m  RA Area:     25.40 cm LA Vol (A2C):   72.5 ml 39.76 ml/m RA Volume:   84.20 ml  46.18 ml/m LA Vol (A4C):   48.4 ml 26.54 ml/m LA Biplane Vol: 62.4 ml 34.22 ml/m  AORTIC VALVE                    PULMONIC VALVE AV Area (Vmax):    2.86 cm     PV Vmax:          3.70 m/s AV Area (Vmean):   2.48 cm     PV Peak grad:     54.8 mmHg AV Area (VTI):     2.41 cm     PR End Diast Vel: 1.25 msec AV Vmax:           139.13 cm/s AV Vmean:          102.758 cm/s AV VTI:            0.311 m AV Peak Grad:      7.7 mmHg AV Mean Grad:      4.8 mmHg LVOT Vmax:         87.90 cm/s LVOT Vmean:        56.300 cm/s LVOT VTI:          0.166 m LVOT/AV VTI ratio: 0.53  AORTA Ao Root diam: 3.30 cm Ao Asc diam:  3.30 cm MITRAL VALVE                TRICUSPID VALVE MV Area (PHT): 4.65 cm     TR Peak grad:   56.9 mmHg MV Decel Time: 163 msec     TR Vmax:        377.00 cm/s MV E velocity: 114.67 cm/s                             SHUNTS                             Systemic VTI:  0.17 m                             Systemic Diam: 2.40 cm Carlyle Dolly MD Electronically signed by Carlyle Dolly MD Signature Date/Time: 08/25/2019/3:16:20 PM    Final    OCT, Retina - OU - Both Eyes  Result Date: 08/05/2019 Right Eye Quality was good. Central Foveal Thickness: 263. Progression has improved. Findings include abnormal foveal contour, intraretinal fluid, no SRF (Mild interval improvement in IRF-temporal fovea). Left Eye Quality was good. Central Foveal Thickness: 230. Progression has been stable. Findings include normal foveal contour, intraretinal fluid, no SRF (Persistent Mild cystic changes). Notes *Images captured and stored on drive Diagnosis / Impression: OD: Mild interval improvement in IRF temporal fovea OS: NFP; +cystic changes / DME; no SRF Clinical management: See below Abbreviations: NFP -  Normal foveal profile. CME - cystoid macular edema. PED - pigment epithelial detachment. IRF -  intraretinal fluid. SRF - subretinal fluid. EZ - ellipsoid zone. ERM - epiretinal membrane. ORA - outer retinal atrophy. ORT - outer retinal tubulation. SRHM - subretinal hyper-reflective material   Fluorescein Angiography Optos (Transit OS)  Result Date: 08/05/2019 Right Eye Progression has no prior data. Early phase findings include vascular perfusion defect, microaneurysm, blockage, leakage. Mid/Late phase findings include blockage. Left Eye Progression has no prior data. Early phase findings include blockage, microaneurysm, vascular perfusion defect, staining, leakage. Mid/Late phase findings include blockage, staining, microaneurysm, leakage. Notes **Images stored on drive** Impression: Moderate NPDR OU Late leaking MA Scattered perfusion defect OU No NV OU   [place] EKG SITE RITE  Result Date: 08/27/2019 If Site Rite image not attached, placement could not be confirmed due to current cardiac rhythm.  US Abdomen Limited RUQ  Result Date: 08/25/2019 CLINICAL DATA:  Elevated LFTs.  Previous cholecystectomy. EXAM: ULTRASOUND ABDOMEN LIMITED RIGHT UPPER QUADRANT COMPARISON:  None. FINDINGS: Gallbladder: Prior cholecystectomy Common bile duct: Diameter: 4.6 mm. Liver: No focal lesion identified. Within normal limits in parenchymal echogenicity. Mild coarse increased echogenicity compatible with a degree of steatosis without focal mass. Other: None. IMPRESSION: 1.  No acute findings. 2.  Previous cholecystectomy. 3.  Suggestion of a degree of hepatic steatosis without focal mass. Electronically Signed   By: Marin Olp M.D.   On: 08/25/2019 13:15        Discharge Exam: Vitals:   08/26/19 2149 08/27/19 0458  BP: (!) 141/62 (!) 152/72  Pulse: 63 86  Resp: 18 18  Temp: 98.5 F (36.9 C) 98.6 F (37 C)  SpO2: 94% 92%   Vitals:   08/26/19 1447 08/26/19 2020 08/26/19 2149 08/27/19 0458  BP: 134/79  (!)  141/62 (!) 152/72  Pulse: 72  63 86  Resp: '18  18 18  ' Temp: 98.3 F (36.8 C)  98.5 F (36.9 C) 98.6 F (37 C)  TempSrc: Oral  Oral Oral  SpO2: 94% 92% 94% 92%  Weight:      Height:        General: Pt is alert, awake, not in acute distress Cardiovascular: IRRR, S1/S2 +, no rubs, no gallops Respiratory: fine bibasilar rales Abdominal: Soft, NT, ND, bowel sounds + Extremities: trace LE edema, no cyanosis   The results of significant diagnostics from this hospitalization (including imaging, microbiology, ancillary and laboratory) are listed below for reference.    Significant Diagnostic Studies: CT CHEST WO CONTRAST  Result Date: 08/25/2019 CLINICAL DATA:  Cough and shortness of breath. History of endometrial carcinoma EXAM: CT CHEST WITHOUT CONTRAST TECHNIQUE: Multidetector CT imaging of the chest was performed following the standard protocol without IV contrast. COMPARISON:  June 24, 2019 FINDINGS: Cardiovascular: There is no evident thoracic aortic aneurysm. There are foci of calcification at multiple sites in visualized great vessels. Note that the right innominate and left common carotid arteries arise as a common trunk, an anatomic variant. There is aortic atherosclerosis as well as multiple foci of native coronary artery calcification. There is no appreciable pericardial effusion or pericardial thickening. Patient is status post median sternotomy with prior coronary artery bypass grafting. 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 There are nodular opacities throughout the left lobe of the thyroid, largest measuring 1.5 x 1.1 cm. There are scattered subcentimeter mediastinal lymph nodes. No adenopathy is appreciable by size criteria. There is a moderate sized hiatal hernia. Lungs/Pleura: There are free-flowing pleural effusions bilaterally, larger on the right than the left, with apparent compressive atelectasis in each  lung base, somewhat more on the right than on the  left. The lungs elsewhere are clear. Upper Abdomen: Gallbladder is absent. There is aortic and major mesenteric arterial vascular calcification. Visualized upper abdominal structures otherwise appear unremarkable on this noncontrast enhanced study. Musculoskeletal: Status post median sternotomy. Degenerative change noted in the thoracic spine. Anterior wedging of the T12 vertebral body is noted. No blastic or lytic bone lesions are evident. No chest wall lesions. IMPRESSION: 1. Free-flowing pleural effusions, larger on the right than on the left, with apparent compressive atelectasis in the lung bases. Lungs otherwise clear. 2.  No evident thoracic adenopathy. 3. 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 Mass lesions in left lobe, largest measuring 1.5 cm. In the setting of significant comorbidities or limited life expectancy, no follow-up recommended. Clinical assessment in this specific case regarding additional surveillance with respect to clinical history advised. (Ref: J Am Coll Radiol. 2015 Feb;12(2): 143-50). 4.  Moderate hiatal hernia noted. 5. Aortic atherosclerosis. Foci of great vessel calcification noted. Status post coronary artery bypass grafting. Native coronary artery calcification noted at multiple sites. Aortic Atherosclerosis (ICD10-I70.0). Electronically Signed   By: Lowella Grip III M.D.   On: 08/25/2019 15:50   US Venous Img Lower Bilateral (DVT)  Result Date: 08/25/2019 CLINICAL DATA:  Bilateral lower extremity edema and pain. EXAM: Bilateral LOWER EXTREMITY VENOUS DOPPLER ULTRASOUND TECHNIQUE: Gray-scale sonography with compression, as well as color and duplex ultrasound, were performed to evaluate the deep venous system(s) from the level of the common femoral vein through the popliteal and proximal calf veins. COMPARISON:  July 02, 2011. FINDINGS: VENOUS Normal compressibility of the common femoral, superficial femoral, and popliteal veins, as well as the visualized calf  veins. Visualized portions of profunda femoral vein and great saphenous vein unremarkable. No filling defects to suggest DVT on grayscale or color Doppler imaging. Doppler waveforms show normal direction of venous flow, normal respiratory plasticity and response to augmentation. Limited views of the contralateral common femoral vein are unremarkable. OTHER None. Limitations: none IMPRESSION: Negative. Electronically Signed   By: Marijo Conception M.D.   On: 08/25/2019 13:20   DG Chest Port 1 View  Result Date: 08/24/2019 CLINICAL DATA:  86 year old female with cough fever and malaise. Test for COVID-19. Pending. EXAM: PORTABLE CHEST 1 VIEW COMPARISON:  Portable chest 09/20/2010 and earlier. FINDINGS: Portable AP upright view at 2020 hours. Chronic cardiomegaly and sequelae of CABG. Calcified aortic atherosclerosis. Stable cardiac size and mediastinal contours. Visualized tracheal air column is within normal limits. Diffuse increased pulmonary interstitial opacity. No pleural fluid identified. No pneumothorax or consolidation. Paucity of bowel gas in the upper abdomen. Chronic degenerative changes at both shoulders. No acute osseous abnormality identified. IMPRESSION: Chronic cardiomegaly with nonspecific increased pulmonary interstitium. Consider interstitial edema versus viral/atypical respiratory infection. No pleural effusion is evident. Electronically Signed   By: Genevie Ann M.D.   On: 08/24/2019 20:31   ECHOCARDIOGRAM COMPLETE  Result Date: 08/25/2019    ECHOCARDIOGRAM REPORT   Patient Name:   LAURNA SHETLEY [facility] Date of Exam: 08/25/2019 Medical Rec #:  [id number]        Height:       61.0 in Accession #:    [id number]       Weight:       184.1 lb Date of Birth:  12/22/1930        BSA:          1.823 m Patient Age:    48 years  BP:           133/77 mmHg Patient Gender: F                HR:           111 bpm. Exam Location:  [facility] Procedure: 2D Echo, Cardiac Doppler and Color Doppler Indications:     R06.02 SOB  History:        Patient has prior history of Echocardiogram examinations, most                 recent 01/25/2019. CAD, Prior CABG, Pulmonary HTN,                 Arrythmias:Atrial Fibrillation, Signs/Symptoms:Dyspnea; Risk                 Factors:Hypertension, Dyslipidemia and Diabetes. Volume                 overload.  Sonographer:    Roseanna Rainbow RDCS (AE) Referring Phys: [phone] Taseen Marasigan IMPRESSIONS  1. Left ventricular ejection fraction, by estimation, is 50 to 55%. The left ventricle has low normal function. The left ventricle has no regional wall motion abnormalities. There is moderate left ventricular hypertrophy. Left ventricular diastolic parameters are indeterminate.  2. Right ventricular systolic function is moderately reduced. The right ventricular size is severely enlarged. There is severely elevated pulmonary artery systolic pressure.  3. Right atrial size was severely dilated.  4. The mitral valve is normal in structure. Trivial mitral valve regurgitation. No evidence of mitral stenosis.  5. Tricuspid valve regurgitation is moderate.  6. The aortic valve is tricuspid. Aortic valve regurgitation is not visualized. No aortic stenosis is present.  7. Severe pulmonary HTN, PASP is 72 mmHg.  8. The inferior vena cava is dilated in size with <50% respiratory variability, suggesting right atrial pressure of 15 mmHg. FINDINGS  Left Ventricle: Left ventricular ejection fraction, by estimation, is 50 to 55%. The left ventricle has low normal function. The left ventricle has no regional wall motion abnormalities. The left ventricular internal cavity size was normal in size. There is moderate left ventricular hypertrophy. Left ventricular diastolic parameters are indeterminate. Right Ventricle: Ventricular setpum is flattened in systole and diastole suggesting RV pressure and volume overload. The right ventricular size is severely enlarged. Right vetricular wall thickness was not assessed. Right  ventricular systolic function is  moderately reduced. There is severely elevated pulmonary artery systolic pressure. The tricuspid regurgitant velocity is 3.77 m/s, and with an assumed right atrial pressure of 15 mmHg, the estimated right ventricular systolic pressure is 71.0 mmHg. Left Atrium: Left atrial size was normal in size. Right Atrium: Right atrial size was severely dilated. Pericardium: There is no evidence of pericardial effusion. Mitral Valve: The mitral valve is normal in structure. There is mild thickening of the mitral valve leaflet(s). There is mild calcification of the mitral valve leaflet(s). Mild mitral annular calcification. Trivial mitral valve regurgitation. No evidence  of mitral valve stenosis. Tricuspid Valve: The tricuspid valve is normal in structure. Tricuspid valve regurgitation is moderate . No evidence of tricuspid stenosis. Aortic Valve: The aortic valve is tricuspid. . There is moderate thickening and moderate calcification of the aortic valve. Aortic valve regurgitation is not visualized. No aortic stenosis is present. Moderate aortic valve annular calcification. There is  moderate thickening of the aortic valve. There is moderate calcification of the aortic valve. Aortic valve mean gradient measures 4.8 mmHg. Aortic valve peak gradient measures 7.7 mmHg.  Aortic valve area, by VTI measures 2.41 cm. Pulmonic Valve: The pulmonic valve was not well visualized. Pulmonic valve regurgitation is mild. No evidence of pulmonic stenosis. Aorta: The aortic root is normal in size and structure. Pulmonary Artery: Severe pulmonary HTN, PASP is 72 mmHg. Venous: The inferior vena cava is dilated in size with less than 50% respiratory variability, suggesting right atrial pressure of 15 mmHg. IAS/Shunts: No atrial level shunt detected by color flow Doppler.  LEFT VENTRICLE PLAX 2D LVIDd:         4.52 cm LVIDs:         3.36 cm LV PW:         1.40 cm LV IVS:        1.36 cm LVOT diam:     2.40 cm LV  SV:         75 LV SV Index:   41 LVOT Area:     4.52 cm  LV Volumes (MOD) LV vol d, MOD A2C: 101.0 ml LV vol d, MOD A4C: 57.0 ml LV vol s, MOD A2C: 54.8 ml LV vol s, MOD A4C: 32.7 ml LV SV MOD A2C:     46.2 ml LV SV MOD A4C:     57.0 ml LV SV MOD BP:      35.2 ml RIGHT VENTRICLE         IVC TAPSE (M-mode): 0.5 cm  IVC diam: 2.53 cm LEFT ATRIUM             Index       RIGHT ATRIUM           Index LA diam:        4.00 cm 2.19 cm/m  RA Area:     25.40 cm LA Vol (A2C):   72.5 ml 39.76 ml/m RA Volume:   84.20 ml  46.18 ml/m LA Vol (A4C):   48.4 ml 26.54 ml/m LA Biplane Vol: 62.4 ml 34.22 ml/m  AORTIC VALVE                    PULMONIC VALVE AV Area (Vmax):    2.86 cm     PV Vmax:          3.70 m/s AV Area (Vmean):   2.48 cm     PV Peak grad:     54.8 mmHg AV Area (VTI):     2.41 cm     PR End Diast Vel: 1.25 msec AV Vmax:           139.13 cm/s AV Vmean:          102.758 cm/s AV VTI:            0.311 m AV Peak Grad:      7.7 mmHg AV Mean Grad:      4.8 mmHg LVOT Vmax:         87.90 cm/s LVOT Vmean:        56.300 cm/s LVOT VTI:          0.166 m LVOT/AV VTI ratio: 0.53  AORTA Ao Root diam: 3.30 cm Ao Asc diam:  3.30 cm MITRAL VALVE                TRICUSPID VALVE MV Area (PHT): 4.65 cm     TR Peak grad:   56.9 mmHg MV Decel Time: 163 msec     TR Vmax:        377.00 cm/s MV E velocity: 114.67 cm/s  SHUNTS                             Systemic VTI:  0.17 m                             Systemic Diam: 2.40 cm Carlyle Dolly MD Electronically signed by Carlyle Dolly MD Signature Date/Time: 08/25/2019/3:16:20 PM    Final    OCT, Retina - OU - Both Eyes  Result Date: 08/05/2019 Right Eye Quality was good. Central Foveal Thickness: 263. Progression has improved. Findings include abnormal foveal contour, intraretinal fluid, no SRF (Mild interval improvement in IRF-temporal fovea). Left Eye Quality was good. Central Foveal Thickness: 230. Progression has been stable. Findings include normal  foveal contour, intraretinal fluid, no SRF (Persistent Mild cystic changes). Notes *Images captured and stored on drive Diagnosis / Impression: OD: Mild interval improvement in IRF temporal fovea OS: NFP; +cystic changes / DME; no SRF Clinical management: See below Abbreviations: NFP - Normal foveal profile. CME - cystoid macular edema. PED - pigment epithelial detachment. IRF - intraretinal fluid. SRF - subretinal fluid. EZ - ellipsoid zone. ERM - epiretinal membrane. ORA - outer retinal atrophy. ORT - outer retinal tubulation. SRHM - subretinal hyper-reflective material   Fluorescein Angiography Optos (Transit OS)  Result Date: 08/05/2019 Right Eye Progression has no prior data. Early phase findings include vascular perfusion defect, microaneurysm, blockage, leakage. Mid/Late phase findings include blockage. Left Eye Progression has no prior data. Early phase findings include blockage, microaneurysm, vascular perfusion defect, staining, leakage. Mid/Late phase findings include blockage, staining, microaneurysm, leakage. Notes **Images stored on drive** Impression: Moderate NPDR OU Late leaking MA Scattered perfusion defect OU No NV OU   [place] EKG SITE RITE  Result Date: 08/27/2019 If Site Rite image not attached, placement could not be confirmed due to current cardiac rhythm.  US Abdomen Limited RUQ  Result Date: 08/25/2019 CLINICAL DATA:  Elevated LFTs.  Previous cholecystectomy. EXAM: ULTRASOUND ABDOMEN LIMITED RIGHT UPPER QUADRANT COMPARISON:  None. FINDINGS: Gallbladder: Prior cholecystectomy Common bile duct: Diameter: 4.6 mm. Liver: No focal lesion identified. Within normal limits in parenchymal echogenicity. Mild coarse increased echogenicity compatible with a degree of steatosis without focal mass. Other: None. IMPRESSION: 1.  No acute findings. 2.  Previous cholecystectomy. 3.  Suggestion of a degree of hepatic steatosis without focal mass. Electronically Signed   By: Marin Olp M.D.   On:  08/25/2019 13:15     Microbiology: Recent Results (from the past 240 hour(s))  Culture, blood (Routine x 2)     Status: Abnormal   Collection Time: 08/24/19  7:16 PM   Specimen: Right Antecubital; Blood  Result Value Ref Range Status   Specimen Description   Final    RIGHT ANTECUBITAL Performed at [facility], [address], [place], [place] [address]    Special Requests   Final    BOTTLES DRAWN AEROBIC AND ANAEROBIC Blood Culture adequate volume Performed at [facility], [address], [place], [place] [address]    Culture  Setup Time   Final    GRAM POSITIVE COCCI Gram Stain Report Called to,Read Back By and Verified With: WRIGHT,E'@1623'  BY MATTHEWS, B 5.20.21 IN BOTH AEROBIC AND ANAEROBIC BOTTLES CRITICAL RESULT CALLED TO, READ BACK BY AND VERIFIED WITH: TDarrow Bussing [id number] 08/26/2019 Mena Goes Performed at [facility], [address], [place], [place] [address]    Culture (A)  Final    STREPTOCOCCUS MITIS/ORALIS SUSCEPTIBILITIES PERFORMED ON PREVIOUS CULTURE WITHIN THE LAST 5 DAYS. Performed at [facility], [place] [address], [place], [place] [address]    Report Status 08/27/2019 FINAL  Final  Culture, blood (Routine x 2)     Status: Abnormal   Collection Time: 08/24/19  7:29 PM   Specimen: Left Antecubital; Blood  Result Value Ref Range Status   Specimen Description   Final    LEFT ANTECUBITAL Performed at [facility], [address], [place], [place] [address]    Special Requests   Final    BOTTLES DRAWN AEROBIC AND ANAEROBIC Blood Culture adequate volume Performed at [facility], [address] [place], [place] [address]    Culture  Setup Time   Final    GRAM POSITIVE COCCI Gram Stain Report Called to,Read Back By and Verified With: WRIGHT,E'@1623'  BY MATTHEWS, B 5.20.21 IN BOTH AEROBIC AND ANAEROBIC BOTTLES CRITICAL RESULT CALLED TO, READ BACK BY AND VERIFIED WITH: TDarrow Bussing [id number] 08/26/2019 Mena Goes Performed at [facility], [address],  [place], [place] [address]    Culture STREPTOCOCCUS MITIS/ORALIS (A)  Final   Report Status 08/27/2019 FINAL  Final   Organism ID, Bacteria STREPTOCOCCUS MITIS/ORALIS  Final      Susceptibility   Streptococcus mitis/oralis - MIC*    TETRACYCLINE >=16 RESISTANT Resistant     VANCOMYCIN 0.5 SENSITIVE Sensitive     CLINDAMYCIN >=1 RESISTANT Resistant     CEFTRIAXONE Value in next row Sensitive      SENSITIVE<=0.12    PENICILLIN Value in next row Sensitive      SENSITIVE<=0.06    * STREPTOCOCCUS MITIS/ORALIS  Blood Culture ID Panel (Reflexed)     Status: Abnormal   Collection Time: 08/24/19  7:29 PM  Result Value Ref Range Status   Enterococcus species NOT DETECTED NOT DETECTED Final   Listeria monocytogenes NOT DETECTED NOT DETECTED Final   Staphylococcus species NOT DETECTED NOT DETECTED Final   Staphylococcus aureus (BCID) NOT DETECTED NOT DETECTED Final   Streptococcus species DETECTED (A) NOT DETECTED Final    Comment: Not Enterococcus species, Streptococcus agalactiae, Streptococcus pyogenes, or Streptococcus pneumoniae. CRITICAL RESULT CALLED TO, READ BACK BY AND VERIFIED WITH: T. MAYO,RN 0115 08/26/2019 T. TYSOR    Streptococcus agalactiae NOT DETECTED NOT DETECTED Final   Streptococcus pneumoniae NOT DETECTED NOT DETECTED Final   Streptococcus pyogenes NOT DETECTED NOT DETECTED Final   Acinetobacter baumannii NOT DETECTED NOT DETECTED Final   Enterobacteriaceae species NOT DETECTED NOT DETECTED Final   Enterobacter cloacae complex NOT DETECTED NOT DETECTED Final   Escherichia coli NOT DETECTED NOT DETECTED Final   Klebsiella oxytoca NOT DETECTED NOT DETECTED Final   Klebsiella pneumoniae NOT DETECTED NOT DETECTED Final   Proteus species NOT DETECTED NOT DETECTED Final   Serratia marcescens NOT DETECTED NOT DETECTED Final   Haemophilus influenzae NOT DETECTED NOT DETECTED Final   Neisseria meningitidis NOT DETECTED NOT DETECTED Final   Pseudomonas aeruginosa NOT DETECTED NOT  DETECTED Final   Candida albicans NOT DETECTED NOT DETECTED Final   Candida glabrata NOT DETECTED NOT DETECTED Final   Candida krusei NOT DETECTED NOT DETECTED Final   Candida parapsilosis NOT DETECTED NOT DETECTED Final   Candida tropicalis NOT DETECTED NOT DETECTED Final    Comment: Performed at [facility], [place] [address], [place], [place] [address]  SARS Coronavirus 2 by RT PCR (hospital order, performed in [facility] hospital lab) Nasopharyngeal Nasopharyngeal Swab  Status: None   Collection Time: 08/24/19  9:27 PM   Specimen: Nasopharyngeal Swab  Result Value Ref Range Status   SARS Coronavirus 2 NEGATIVE NEGATIVE Final    Comment: (NOTE) SARS-CoV-2 target nucleic acids are NOT DETECTED. The SARS-CoV-2 RNA is generally detectable in upper and lower respiratory specimens during the acute phase of infection. The lowest concentration of SARS-CoV-2 viral copies this assay can detect is 250 copies / mL. A negative result does not preclude SARS-CoV-2 infection and should not be used as the sole basis for treatment or other patient management decisions.  A negative result may occur with improper specimen collection / handling, submission of specimen other than nasopharyngeal swab, presence of viral mutation(s) within the areas targeted by this assay, and inadequate number of viral copies (<250 copies / mL). A negative result must be combined with clinical observations, patient history, and epidemiological information. Fact Sheet for Patients:   StrictlyIdeas.no Fact Sheet for Healthcare Providers: BankingDealers.co.za This test is not yet approved or cleared  by the [place] FDA and has been authorized for detection and/or diagnosis of SARS-CoV-2 by FDA under an Emergency Use Authorization (EUA).  This EUA will remain in effect (meaning this test can be used) for the duration of the COVID-19 declaration under Section  564(b)(1) of the Act, 21 U.S.C. section 360bbb-3(b)(1), unless the authorization is terminated or revoked sooner. Performed at [facility], [address] [place], [place] [address]      Labs: Basic Metabolic Panel: Recent Labs  Lab 08/24/19 1916 08/24/19 1916 08/25/19 0449 08/25/19 0449 08/26/19 0450 08/27/19 0613  NA 133*  --  134*  --  134* 134*  K 4.2   < > 4.2   < > 3.9 3.4*  CL 98  --  100  --  100 100  CO2 23  --  22  --  22 24  GLUCOSE 192*  --  232*  --  125* 135*  BUN 30*  --  31*  --  35* 29*  CREATININE 1.00  --  1.04*  --  1.19* 0.95  CALCIUM 9.0  --  8.5*  --  8.3* 8.1*  MG  --   --  1.8  --   --  2.0   < > = values in this interval not displayed.   Liver Function Tests: Recent Labs  Lab 08/24/19 1916 08/25/19 0449  AST 71* 93*  ALT 64* 88*  ALKPHOS 75 70  BILITOT 1.0 1.5*  PROT 7.3 6.6  ALBUMIN 3.9 3.3*   No results for input(s): LIPASE, AMYLASE in the last 168 hours. No results for input(s): AMMONIA in the last 168 hours. CBC: Recent Labs  Lab 08/24/19 1916 08/25/19 0449 08/26/19 0450 08/27/19 0613  WBC 11.7* 11.3* 7.6 6.7  NEUTROABS 11.0*  --   --   --   HGB 11.2* 10.6* 9.9* 9.9*  HCT 34.5* 32.3* 30.9* 30.3*  MCV 91.8 92.8 92.2 92.7  PLT 169 138* 121* 134*   Cardiac Enzymes: No results for input(s): CKTOTAL, CKMB, CKMBINDEX, TROPONINI in the last 168 hours. BNP: Invalid input(s): POCBNP CBG: Recent Labs  Lab 08/26/19 1112 08/26/19 1630 08/26/19 2151 08/27/19 0720 08/27/19 1128  GLUCAP 170* 145* 157* 153* 146*    Time coordinating discharge:  36 minutes  Signed:  Orson Eva, DO [facility] Pager: [phone] 08/27/2019, 11:58 AM

## 2019-08-27 NOTE — Progress Notes (Signed)
PHARMACY CONSULT NOTE FOR:  OUTPATIENT  PARENTERAL ANTIBIOTIC THERAPY (OPAT)  Indication:  S. mtis bacteremia Regimen: ceftriaxone 2g IV q24h x7 days End date:  09/04/2019  IV antibiotic discharge orders are pended. To discharging provider:  please sign these orders via discharge navigator,  Select New Orders & click on the button choice - Manage This Unsigned Work.     Thank you for allowing pharmacy to be a part of this patient's care.  Despina Pole 08/27/2019, 11:24 AM

## 2019-08-27 NOTE — Progress Notes (Signed)
Peripheral IV removed, PICC line safety and infection prevention reviewed with patient as well as discharge orders. Patient verbalized understanding. To be transported to private vehicle via wheelchair. Will continue to monitor.

## 2019-08-27 NOTE — Progress Notes (Signed)
Mardene Celeste, RN notified of PICC placement confirmed in good position by X ray and is ready to use.

## 2019-08-27 NOTE — TOC Progression Note (Signed)
Transition of Care [facility]) - Progression Note    Patient Details  Name: [name] MRN: [id number] Date of Birth: [birth year]  Transition of Care [facility] CM/SW Contact  [place], [place], [place] Phone Number: 08/27/2019, 1:02 PM  Clinical Narrative:    Patient's home IV antibiotics arranged though [facility]. This social worker spoke with Astrid Divine who has arranged for patient's medications to be delivered to her home tonight. A nurse from [facility] will be out to visit patient tomorrow between 10am and 12pm to complete teaching on how to admister medication. Per RN, patient to have dose of ceftriaxone   before she discharges home this afternoon.  Astrid Divine will contact patient to confirm above plan.   [place], LCSW Transitions of Care [phone]    Expected Discharge Plan: Home/Self Care Barriers to Discharge: Continued Medical Work up  Expected Discharge Plan and Services Expected Discharge Plan: Home/Self Care     Post Acute Care Choice: NA Living arrangements for the past 2 months: Single Family Home                                       Social Determinants of Health (SDOH) Interventions    Readmission Risk Interventions No flowsheet data found.

## 2019-08-27 NOTE — Progress Notes (Signed)
Contacted Dr Tat about PICC line order. Patient has positive blood cultures and best practice is to not place PICC in patients with positive blood cultures. Per Dr Tat, place PICC line in spite of positive cultures today, patient is to be discharged home.

## 2019-08-27 NOTE — Progress Notes (Signed)
ANTICOAGULATION CONSULT NOTE - Initial Consult  Pharmacy Consult for warfarin Indication: atrial fibrillation  Allergies  Allergen Reactions  . Statins Other (See Comments)    No energy, fatigue.     Patient Measurements: Height: 5\' 1"  (154.9 cm) Weight: 83.5 kg (184 lb 1.4 oz) IBW/kg (Calculated) : 47.8   Vital Signs: Temp: 98.6 F (37 C) (05/22 0458) Temp Source: Oral (05/22 0458) BP: 152/72 (05/22 0458) Pulse Rate: 86 (05/22 0458)  Labs: Recent Labs    08/24/19 1916 08/24/19 1916 08/25/19 0449 08/25/19 1034 08/26/19 0450 08/27/19 0613  HGB 11.2*   < > 10.6*  --  9.9*  --   HCT 34.5*  --  32.3*  --  30.9*  --   PLT 169  --  138*  --  121*  --   LABPROT 22.7*   < >  --  21.3* 19.7* 17.1*  INR 2.1*   < >  --  1.9* 1.7* 1.4*  CREATININE 1.00   < > 1.04*  --  1.19* 0.95   < > = values in this interval not displayed.    Estimated Creatinine Clearance: 41.7 mL/min (by C-G formula based on SCr of 0.95 mg/dL).   Assessment: Pharmacy consulted to dose warfarin for patient with atrial fibrillation. Patient's home dose verified today as 5mg  every other day alternating with 7.5mg  every other day.  5/22 update: INR 1.4 CBC ordered stat since Hb decreased from 11.2 on 5/19 to 9.9 on 5/21 RN reports no signs of bleeding at this time.    Goal of Therapy:  INR 2-3 Monitor platelets by anticoagulation protocol: Yes   Plan:  Repeat  warfarin 7.5 mg x 1 dose today for INR 1.4 (50 % boost dose) Monitor daily INR and s/s of bleeding. CBC qMon-Wed-Fri    Despina Pole, Pharm. D. Clinical Pharmacist 08/27/2019 10:42 AM

## 2019-08-28 DIAGNOSIS — J90 Pleural effusion, not elsewhere classified: Secondary | ICD-10-CM | POA: Diagnosis not present

## 2019-08-28 DIAGNOSIS — I110 Hypertensive heart disease with heart failure: Secondary | ICD-10-CM | POA: Diagnosis not present

## 2019-08-28 DIAGNOSIS — L03115 Cellulitis of right lower limb: Secondary | ICD-10-CM | POA: Diagnosis not present

## 2019-08-28 DIAGNOSIS — I4821 Permanent atrial fibrillation: Secondary | ICD-10-CM | POA: Diagnosis not present

## 2019-08-28 DIAGNOSIS — I5031 Acute diastolic (congestive) heart failure: Secondary | ICD-10-CM | POA: Diagnosis not present

## 2019-08-28 DIAGNOSIS — B954 Other streptococcus as the cause of diseases classified elsewhere: Secondary | ICD-10-CM | POA: Diagnosis not present

## 2019-08-28 DIAGNOSIS — C541 Malignant neoplasm of endometrium: Secondary | ICD-10-CM | POA: Diagnosis not present

## 2019-08-28 DIAGNOSIS — I2510 Atherosclerotic heart disease of native coronary artery without angina pectoris: Secondary | ICD-10-CM | POA: Diagnosis not present

## 2019-08-28 DIAGNOSIS — E041 Nontoxic single thyroid nodule: Secondary | ICD-10-CM | POA: Diagnosis not present

## 2019-08-28 DIAGNOSIS — R7881 Bacteremia: Secondary | ICD-10-CM | POA: Diagnosis not present

## 2019-08-28 DIAGNOSIS — M1990 Unspecified osteoarthritis, unspecified site: Secondary | ICD-10-CM | POA: Diagnosis not present

## 2019-08-28 DIAGNOSIS — Z452 Encounter for adjustment and management of vascular access device: Secondary | ICD-10-CM | POA: Diagnosis not present

## 2019-08-28 DIAGNOSIS — N2889 Other specified disorders of kidney and ureter: Secondary | ICD-10-CM | POA: Diagnosis not present

## 2019-08-28 DIAGNOSIS — E119 Type 2 diabetes mellitus without complications: Secondary | ICD-10-CM | POA: Diagnosis not present

## 2019-08-28 DIAGNOSIS — I272 Pulmonary hypertension, unspecified: Secondary | ICD-10-CM | POA: Diagnosis not present

## 2019-08-28 DIAGNOSIS — E785 Hyperlipidemia, unspecified: Secondary | ICD-10-CM | POA: Diagnosis not present

## 2019-08-30 DIAGNOSIS — J189 Pneumonia, unspecified organism: Secondary | ICD-10-CM | POA: Diagnosis not present

## 2019-09-07 DIAGNOSIS — J189 Pneumonia, unspecified organism: Secondary | ICD-10-CM | POA: Diagnosis not present

## 2019-09-27 DIAGNOSIS — E782 Mixed hyperlipidemia: Secondary | ICD-10-CM | POA: Diagnosis not present

## 2019-09-27 DIAGNOSIS — I10 Essential (primary) hypertension: Secondary | ICD-10-CM | POA: Diagnosis not present

## 2019-09-27 DIAGNOSIS — E119 Type 2 diabetes mellitus without complications: Secondary | ICD-10-CM | POA: Diagnosis not present

## 2019-09-27 DIAGNOSIS — I480 Paroxysmal atrial fibrillation: Secondary | ICD-10-CM | POA: Diagnosis not present

## 2019-09-29 ENCOUNTER — Ambulatory Visit: Payer: Medicare Other | Admitting: "Endocrinology

## 2019-09-29 ENCOUNTER — Encounter: Payer: Self-pay | Admitting: "Endocrinology

## 2019-09-29 ENCOUNTER — Other Ambulatory Visit: Payer: Self-pay

## 2019-09-29 VITALS — BP 136/54 | HR 48 | Ht 61.0 in | Wt 176.8 lb

## 2019-09-29 DIAGNOSIS — E049 Nontoxic goiter, unspecified: Secondary | ICD-10-CM | POA: Insufficient documentation

## 2019-09-29 NOTE — Progress Notes (Signed)
Endocrinology Consult Note                                            09/29/2019, 6:04 PM   Subjective:    Patient ID: [name], female    DOB: 02-13-1931, PCP Neale Burly, MD   Past Medical History:  Diagnosis Date  . Atrial fibrillation [facility])    Maze procedure September, 2010, amiodarone and Coumadin stopped eventually January, 2011  . Coronary artery disease   . Ejection fraction    EF 60%, echo, September, 2010  . Endometrial ca [facility]) 09/25/2010   June, 2012, radiation and chemotherapy, Dr. Tressie Stalker  . Fluid overload   . Hiatal hernia   . HTN (hypertension)   . Hx of CABG    2010, Dr.Van Trigt, Maze procedure, ligation of left atrial appendage, Coumadin stopped January, 2011  . Hyperlipidemia   . Noninsulin dependent diabetes mellitus   . Osteoarthritis    Severe  . Renal insufficiency    Mild with higher dose diuretic  . Shortness of breath   . Statin intolerance    Is   Past Surgical History:  Procedure Laterality Date  . ABDOMINAL HYSTERECTOMY  07/2010   complete, endometrial cancer  . APPENDECTOMY  60 yrs ago  . BREAST MASS EXCISION     left breast 1985  . CARDIAC CATHETERIZATION    . CATARACT EXTRACTION Bilateral 2006   Dr. Iona Hansen  . choleycystectomy  1998  . COLONOSCOPY     several in [place], last one by Dr. Rowe Pavy, polyps once in [place]. records requested.  . COLONOSCOPY  06/2010   Dr. Rodrigo Ran good. Moderate diverticulosis in left colon. Inflammation found in descending colon, scope could not be advanced forward.  ACBE-->sigmoid diverticulosis no worrisome findings.   . CORONARY ARTERY BYPASS GRAFT  12/21/08   [place] Gum; LIMA LAD, SVG posterior descending, SVG OM, maze procedure, ligation left atrial appendage and MAZE procedure for A.fib  . ESOPHAGOGASTRODUODENOSCOPY  03/08/2012   [id number] ring was found at the gastroesophageal junction/Nodular gastritis (inflammation)/duodenal mucosa showed no abnormalities   . PORT-A-CATH  REMOVAL  01/23/2012   Procedure: REMOVAL PORT-A-CATH;  Surgeon: Jamesetta So, MD;  Location: [facility];  Service: General;  Laterality: N/A;  Minor Room  . PORTACATH PLACEMENT    . TONSILECTOMY, ADENOIDECTOMY, BILATERAL MYRINGOTOMY AND TUBES    . TUMOR REMOVAL     from throat  benign   Social History   Socioeconomic History  . Marital status: Widowed    Spouse name: Not on file  . Number of children: 1  . Years of education: Not on file  . Highest education level: Not on file  Occupational History  . Occupation: Retired    Fish farm manager: RETIRED  Tobacco Use  . Smoking status: Never Smoker  . Smokeless tobacco: Never Used  Substance and Sexual Activity  . Alcohol use: No    Alcohol/week: 0.0 standard drinks  . Drug use: No  . Sexual activity: Not on file  Other Topics Concern  . Not on file  Social History Narrative  . Not on file   Social Determinants of Health   Financial Resource Strain:   . Difficulty of Paying Living Expenses:   Food Insecurity:   . Worried About Charity fundraiser in the Last Year:   . [employer]  of Food in the Last Year:   Transportation Needs:   . Film/video editor (Medical):   Marland Kitchen Lack of Transportation (Non-Medical):   Physical Activity:   . Days of Exercise per Week:   . Minutes of Exercise per Session:   Stress:   . Feeling of Stress :   Social Connections:   . Frequency of Communication with Friends and Family:   . Frequency of Social Gatherings with Friends and Family:   . Attends Religious Services:   . Active Member of Clubs or Organizations:   . Attends Archivist Meetings:   Marland Kitchen Marital Status:    Family History  Problem Relation Age of Onset  . Hypertension Mother   . Breast cancer Mother   . Heart attack Mother        deceased, age 72  . Cancer Mother   . Diabetes Sister        deceased - 22, renal failure,   . Heart attack Father   . Cancer Father   . Colon cancer Neg Hx   . Ovarian cancer Neg Hx   . Lung  cancer Neg Hx   . Liver disease Neg Hx    Outpatient Encounter Medications as of 09/29/2019  Medication Sig  . alendronate (FOSAMAX) 70 MG tablet Take 70 mg by mouth once a week. Take with a full glass of water on an empty stomach.  . Calcium Carbonate (CALCIUM 500 PO) Take 500 mg by mouth 2 (two) times daily.  . Cholecalciferol (VITAMIN D PO) Take 400 Units by mouth daily.   Marland Kitchen denosumab (PROLIA) 60 MG/ML SOSY injection Inject 60 mg into the skin every 6 (six) months.  . diltiazem (CARDIZEM CD) 240 MG 24 hr capsule Take 240 mg by mouth daily.  . Diphenhydramine-APAP, sleep, (TYLENOL PM EXTRA STRENGTH PO) Take 1 tablet by mouth at bedtime as needed. Sleep  . furosemide (LASIX) 40 MG tablet Take 1 tablet (40 mg total) by mouth daily.  Marland Kitchen glipiZIDE (GLUCOTROL) 5 MG tablet Take 5 mg by mouth daily.   . Glucosamine 500 MG CAPS Take 500 mg by mouth 2 (two) times daily.    Marland Kitchen HYDROcodone-acetaminophen (NORCO/VICODIN) 5-325 MG per tablet Take 1 tablet by mouth every 8 (eight) hours as needed for moderate pain.  . magnesium oxide (MAG-OX) 400 MG tablet Take 400 mg by mouth daily.  . metFORMIN (GLUCOPHAGE) 500 MG tablet Take 500 mg by mouth at bedtime. Once daily at bedtime  . metoprolol succinate (TOPROL-XL) 50 MG 24 hr tablet TAKE 1 AND 1/2 TABLETS BY MOUTH TWICE DAILY  . Multiple Vitamin (MULTIVITAMIN) tablet Take 1 tablet by mouth daily.  . Omega-3 Fatty Acids (FISH OIL) 1200 MG CAPS Take 1,200 mg by mouth 2 (two) times daily.  Marland Kitchen warfarin (COUMADIN) 5 MG tablet Take 5 mg by mouth daily. 7.5mg  alt w/ 5mg  every other day PER [name]'S OFFICE.  . [DISCONTINUED] sodium bicarbonate 650 MG tablet Take 650 mg by mouth 3 (three) times daily.   No facility-administered encounter medications on file as of 09/29/2019.   ALLERGIES: Allergies  Allergen Reactions  . Statins Other (See Comments)    No energy, fatigue.     VACCINATION STATUS: Immunization History  Administered Date(s) Administered  .  Influenza-Unspecified 01/05/2018  . PFIZER SARS-COV-2 Vaccination 04/28/2019, 05/19/2019    HPI [name] is 86 y.o. female who presents today with a medical history as above. she is being seen in consultation for nodular goiter requested  by Neale Burly, MD.  -History is obtained from the patient directly.  She denies any prior history of thyroid dysfunction.  Recently she was admitted to [facility] for pneumonia.  CT scan of her chest incidentally revealed a 1.5 cm nodule on the left lobe of her thyroid. -She denies dysphagia, shortness of breath, nor voice change.  She did not have any recent thyroid ultrasound nor thyroid function test.  She underwent controlled type 2 diabetes with A1c of 6.1%.  She is taking Metformin and normal glipizide.  She denies any family history of thyroid malignancy.  She denies any exposure to neck radiation.  Review of Systems  Constitutional: no recent weight gain/loss, no fatigue, no subjective hyperthermia, no subjective hypothermia Eyes: no blurry vision, no xerophthalmia ENT: no sore throat, no nodules palpated in throat, no dysphagia/odynophagia, no hoarseness Cardiovascular: no Chest Pain, no Shortness of Breath, no palpitations, no leg swelling Respiratory: no cough, no shortness of breath Gastrointestinal: no Nausea/Vomiting/Diarhhea Musculoskeletal: no muscle/joint aches Skin: no rashes Neurological: no tremors, no numbness, no tingling, no dizziness Psychiatric: no depression, no anxiety  Objective:    Vitals with BMI 09/29/2019 08/27/2019 08/26/2019  Height 5\' 1"  - -  Weight 176 lbs 13 oz - -  BMI 61.95 - -  Systolic 093 267 124  Diastolic 54 72 62  Pulse 48 86 63    BP (!) 136/54   Pulse (!) 48   Ht 5\' 1"  (1.549 m)   Wt 176 lb 12.8 oz (80.2 kg)   BMI 33.41 kg/m   Wt Readings from Last 3 Encounters:  09/29/19 176 lb 12.8 oz (80.2 kg)  08/25/19 184 lb 1.4 oz (83.5 kg)  12/02/18 167 lb (75.8 kg)    Physical  Exam  Constitutional:  Body mass index is 33.41 kg/m.,  not in acute distress, normal state of mind Eyes: PERRLA, EOMI, no exophthalmos ENT: moist mucous membranes, no gross thyromegaly, no gross cervical lymphadenopathy Cardiovascular: normal precordial activity, Regular Rate and Rhythm, no Murmur/Rubs/Gallops Respiratory:  adequate breathing efforts, no gross chest deformity, Clear to auscultation bilaterally Gastrointestinal: abdomen soft, Non -tender, No distension, Bowel Sounds present, no gross organomegaly Musculoskeletal: + Walks with a walker due to disequilibrium, no gross deformities, strength intact in all four extremities Skin: moist, warm, no rashes Neurological: no tremor with outstretched hands, Deep tendon reflexes normal in bilateral lower extremities.  CMP ( most recent) CMP     Component Value Date/Time   NA 134 (L) 08/27/2019 0613   K 3.4 (L) 08/27/2019 0613   CL 100 08/27/2019 0613   CO2 24 08/27/2019 0613   GLUCOSE 135 (H) 08/27/2019 0613   BUN 29 (H) 08/27/2019 0613   CREATININE 0.95 08/27/2019 0613   CALCIUM 8.1 (L) 08/27/2019 0613   PROT 6.6 08/25/2019 0449   ALBUMIN 3.3 (L) 08/25/2019 0449   AST 93 (H) 08/25/2019 0449   ALT 88 (H) 08/25/2019 0449   ALKPHOS 70 08/25/2019 0449   BILITOT 1.5 (H) 08/25/2019 0449   GFRNONAA 54 (L) 08/27/2019 0613   GFRAA >60 08/27/2019 5809     Diabetic Labs (most recent): Lab Results  Component Value Date   HGBA1C 6.1 (H) 08/24/2019   HGBA1C (H) 12/19/2008    7.5 (NOTE) The ADA recommends the following therapeutic goal for glycemic control related to Hgb A1c measurement: Goal of therapy: <6.5 Hgb A1c  Reference: American Diabetes Association: Clinical Practice Recommendations 2010, Diabetes Care, 2010, 33: (Suppl  1).  Assessment & Plan:   1. Nodular goiter  - [name]  is being seen at a kind request of [name], Samul Dada, MD. - I have reviewed her available thyroid records and clinically evaluated the  patient. - Based on these reviews, she has incidentally discovered 1.5 cm left thyroid lobe nodule,  however,  there is not sufficient information to proceed with definitive treatment plan.  - she will need a full set of thyroid function tests and dedicated thyroid ultrasound to be completed in the next 1-2 weeks.  She will return in 3 to 4 weeks for review of her studies.   I did not initiate any new prescriptions for her today. - I did not initiate any new prescriptions today. - she is advised to maintain close follow up with Neale Burly, MD for primary care needs.   - Time spent with the patient: 45 minutes, of which >50% was spent in  counseling her about her nodular goiter and the rest in obtaining information about her symptoms, reviewing her previous labs/studies ( including abstractions from other facilities),  evaluations, and treatments,  and developing a plan to confirm diagnosis and long term treatment based on the latest standards of care/guidelines; and documenting her care.  [name] participated in the discussions, expressed understanding, and voiced agreement with the above plans.  All questions were answered to her satisfaction. she is encouraged to contact clinic should she have any questions or concerns prior to her return visit.  Follow up plan: Return in about 3 weeks (around 10/20/2019) for Labs Today- Non-Fasting Ok, Thyroid / Neck Ultrasound.   Glade Lloyd, MD [facility] [address] [place], [place] [address] Phone: [phone]  Fax: [phone]     09/29/2019, 6:04 PM  This note was partially dictated with voice recognition software. Similar sounding words can be transcribed inadequately or may not  be corrected upon review.

## 2019-09-30 LAB — T4, FREE: Free T4: 1.1 ng/dL (ref 0.8–1.8)

## 2019-09-30 LAB — TSH: TSH: 6.81 mIU/L — ABNORMAL HIGH (ref 0.40–4.50)

## 2019-09-30 LAB — THYROID PEROXIDASE ANTIBODY: Thyroperoxidase Ab SerPl-aCnc: 1 IU/mL (ref <9)

## 2019-09-30 LAB — T3, FREE: T3, Free: 3 pg/mL (ref 2.3–4.2)

## 2019-09-30 LAB — THYROGLOBULIN ANTIBODY: Thyroglobulin Ab: <1 IU/mL (ref <=1)

## 2019-10-04 NOTE — Progress Notes (Signed)
[facility] Note  10/06/2019     CHIEF COMPLAINT Patient presents for Retina Follow Up   HISTORY OF PRESENT ILLNESS: [name] is a 86 y.o. female who presents to the clinic today for:   HPI    Retina Follow Up    Patient presents with  Diabetic Retinopathy.  In both eyes.  Severity is moderate.  Duration of 9 weeks.  I, the attending physician,  performed the HPI with the patient and updated documentation appropriately.          Comments    9 week follow up NPDR OU- Vision appears stable OU.  Denies any new problems. BS: 126 yesterday, A1C: 7.3       Last edited by Bernarda Caffey, MD on 10/06/2019 11:52 PM. (History)    Pt has not noticed any change in [place] since last visit.  BS avgs between 115 and 130.  Last ck of BP was 126/67.  Referring physician: Neale Burly, MD [address],  [place] [id number]  HISTORICAL INFORMATION:   Selected notes from the MEDICAL RECORD NUMBER Referred by Dr. Leticia Clas for concern of NPDR with mac edema OU LEE: 12.29.20 Leander Rams) [BCVA: OD: 20/30- OS: 20/40-] Ocular Hx-pseduo OU Iona Hansen, '06), PC fibrosis, PCO, PVD, asteroid OU, nevus OD, drusen OU, HTN ret OU PMH-   CURRENT MEDICATIONS: No current outpatient medications on file. (Ophthalmic Drugs)   No current facility-administered medications for this visit. (Ophthalmic Drugs)   Current Outpatient Medications (Other)  Medication Sig  . alendronate (FOSAMAX) 70 MG tablet Take 70 mg by mouth once a week. Take with a full glass of water on an empty stomach.  . Calcium Carbonate (CALCIUM 500 PO) Take 500 mg by mouth 2 (two) times daily.  . Cholecalciferol (VITAMIN D PO) Take 400 Units by mouth daily.   Marland Kitchen denosumab (PROLIA) 60 MG/ML SOSY injection Inject 60 mg into the skin every 6 (six) months.  . diltiazem (CARDIZEM CD) 240 MG 24 hr capsule Take 240 mg by mouth daily.  . Diphenhydramine-APAP, sleep, (TYLENOL PM EXTRA STRENGTH PO) Take 1  tablet by mouth at bedtime as needed. Sleep  . furosemide (LASIX) 40 MG tablet Take 1 tablet (40 mg total) by mouth daily.  Marland Kitchen glipiZIDE (GLUCOTROL) 5 MG tablet Take 5 mg by mouth daily.   . Glucosamine 500 MG CAPS Take 500 mg by mouth 2 (two) times daily.    Marland Kitchen HYDROcodone-acetaminophen (NORCO/VICODIN) 5-325 MG per tablet Take 1 tablet by mouth every 8 (eight) hours as needed for moderate pain.  . magnesium oxide (MAG-OX) 400 MG tablet Take 400 mg by mouth daily.  . metFORMIN (GLUCOPHAGE) 500 MG tablet Take 500 mg by mouth at bedtime. Once daily at bedtime  . metoprolol succinate (TOPROL-XL) 50 MG 24 hr tablet TAKE 1 AND 1/2 TABLETS BY MOUTH TWICE DAILY  . Multiple Vitamin (MULTIVITAMIN) tablet Take 1 tablet by mouth daily.  . Omega-3 Fatty Acids (FISH OIL) 1200 MG CAPS Take 1,200 mg by mouth 2 (two) times daily.  Marland Kitchen warfarin (COUMADIN) 5 MG tablet Take 5 mg by mouth daily. 7.23m alt w/ 511mevery other day PER [name]'S OFFICE.   No current facility-administered medications for this visit. (Other)      REVIEW OF SYSTEMS: ROS    Positive for: Gastrointestinal, Endocrine, Cardiovascular, Eyes   Negative for: Constitutional, Neurological, Skin, Genitourinary, Musculoskeletal, HENT, Respiratory, Psychiatric, Allergic/Imm, Heme/Lymph   Last edited by  Leonie Douglas, COA on 10/06/2019  9:21 AM. (History)       ALLERGIES Allergies  Allergen Reactions  . Statins Other (See Comments)    No energy, fatigue.     PAST MEDICAL HISTORY Past Medical History:  Diagnosis Date  . Atrial fibrillation [facility])    Maze procedure September, 2010, amiodarone and Coumadin stopped eventually January, 2011  . Coronary artery disease   . Ejection fraction    EF 60%, echo, September, 2010  . Endometrial ca [facility]) 09/25/2010   June, 2012, radiation and chemotherapy, Dr. Tressie Stalker  . Fluid overload   . Hiatal hernia   . HTN (hypertension)   . Hx of CABG    2010, Dr.Van Trigt, Maze procedure, ligation of left  atrial appendage, Coumadin stopped January, 2011  . Hyperlipidemia   . Noninsulin dependent diabetes mellitus   . Osteoarthritis    Severe  . Renal insufficiency    Mild with higher dose diuretic  . Shortness of breath   . Statin intolerance    Is   Past Surgical History:  Procedure Laterality Date  . ABDOMINAL HYSTERECTOMY  07/2010   complete, endometrial cancer  . APPENDECTOMY  60 yrs ago  . BREAST MASS EXCISION     left breast 1985  . CARDIAC CATHETERIZATION    . CATARACT EXTRACTION Bilateral 2006   Dr. Iona Hansen  . choleycystectomy  1998  . COLONOSCOPY     several in [place], last one by Dr. Rowe Pavy, polyps once in [place]. records requested.  . COLONOSCOPY  06/2010   Dr. Rodrigo Ran good. Moderate diverticulosis in left colon. Inflammation found in descending colon, scope could not be advanced forward.  ACBE-->sigmoid diverticulosis no worrisome findings.   . CORONARY ARTERY BYPASS GRAFT  12/21/08   [place] Gum; LIMA LAD, SVG posterior descending, SVG OM, maze procedure, ligation left atrial appendage and MAZE procedure for A.fib  . ESOPHAGOGASTRODUODENOSCOPY  03/08/2012   TML:[id number] ring was found at the gastroesophageal junction/Nodular gastritis (inflammation)/duodenal mucosa showed no abnormalities   . PORT-A-CATH REMOVAL  01/23/2012   Procedure: REMOVAL PORT-A-CATH;  Surgeon: Jamesetta So, MD;  Location: [facility];  Service: General;  Laterality: N/A;  Minor Room  . PORTACATH PLACEMENT    . TONSILECTOMY, ADENOIDECTOMY, BILATERAL MYRINGOTOMY AND TUBES    . TUMOR REMOVAL     from throat  benign    FAMILY HISTORY Family History  Problem Relation Age of Onset  . Hypertension Mother   . Breast cancer Mother   . Heart attack Mother        deceased, age 69  . Cancer Mother   . Diabetes Sister        deceased - 81, renal failure,   . Heart attack Father   . Cancer Father   . Colon cancer Neg Hx   . Ovarian cancer Neg Hx   . Lung cancer Neg Hx   . Liver disease Neg Hx      SOCIAL HISTORY Social History   Tobacco Use  . Smoking status: Never Smoker  . Smokeless tobacco: Never Used  Substance Use Topics  . Alcohol use: No    Alcohol/week: 0.0 standard drinks  . Drug use: No         OPHTHALMIC EXAM:  Base Eye Exam    Visual Acuity (Snellen - Linear)      Right Left   Dist cc 20/40 20/50 +2   Dist ph cc 20/25 20/40   Correction: Glasses  Tonometry (Tonopen, 9:29 AM)      Right Left   Pressure 13 12       Pupils      Dark Light Shape React APD   Right 3 2 Round Brisk None   Left 3 2 Round Brisk None       Visual Fields (Counting fingers)      Left Right    Full Full       Extraocular Movement      Right Left    Full Full       Neuro/Psych    Oriented x3: Yes   Mood/Affect: Normal       Dilation    Both eyes: 1.0% Mydriacyl, 2.5% Phenylephrine @ 9:29 AM        Slit Lamp and Fundus Exam    Slit Lamp Exam      Right Left   Lids/Lashes Dermatochalasis - upper lid Dermatochalasis - upper lid   Conjunctiva/Sclera White and quiet White and quiet   Cornea Trace Punctate epithelial erosions, mild EBMD 1+ Punctate epithelial erosions, mild EBMD   Anterior Chamber Deep and quiet Deep and quiet   Iris Round and dilated, No NVI Round and dilated, No NVI   Lens PC IOL in good position, 2+ Posterior capsular opacification non central PC IOL in good position, mild anterior capsular phimosis, trace, non-central Posterior capsular opacification   Vitreous Vitreous syneresis, Posterior vitreous detachment Vitreous syneresis, Posterior vitreous detachment       Fundus Exam      Right Left   Disc Tilted disc, mild Pallor, +PPA Compact, mild Pallor, mildly Tilted disc, mild PPE   C/D Ratio 0.3 0.1   Macula Flat, Blunted foveal reflex, cluster of Microaneurysms just inferior to fovea--persistent.  +cystic changes Flat, Blunted foveal reflex, mild RPE mottling and clumping, scattered Microaneurysms, mild cluster inferior to  fovea--improving   Vessels Vascular attenuation, Tortuous Vascular attenuation, Tortuous   Periphery Attached, scattered MA/DBH and peripheral drusen, ?irregular nevus at 1000 w/overlying drusen--stable Attached, rare MA/DBH, scattered peripheral drusen        Refraction    Wearing Rx      Sphere Cylinder Axis   Right -1.50 +0.75 023   Left [place] +0.50 105          IMAGING AND PROCEDURES  Imaging and Procedures for _0 @  OCT, Retina - OU - Both Eyes       Right Eye Quality was good. Central Foveal Thickness: 260. Progression has worsened. Findings include abnormal foveal contour, intraretinal fluid, no SRF (Mild interval increase in cystic changes).   Left Eye Quality was good. Central Foveal Thickness: 227. Progression has improved. Findings include normal foveal contour, intraretinal fluid, no SRF (Interval improvement in cystic changes).   Notes *Images captured and stored on drive  Diagnosis / Impression:  OD: Mild interval increase in cystic changes OS: NFP; +cystic changes / DME; no SRF -- Interval improvement in cystic changes  Clinical management:  See below  Abbreviations: NFP - Normal foveal profile. CME - cystoid macular edema. PED - pigment epithelial detachment. IRF - intraretinal fluid. SRF - subretinal fluid. EZ - ellipsoid zone. ERM - epiretinal membrane. ORA - outer retinal atrophy. ORT - outer retinal tubulation. SRHM - subretinal hyper-reflective material                 ASSESSMENT/PLAN:    ICD-10-CM   1. 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 Retinal edema  H35.81 OCT, Retina - OU - Both Eyes  3. Essential hypertension  I10   4. Hypertensive retinopathy of both eyes  H35.033   5. Pseudophakia of both eyes  Z96.1   6. Choroidal nevus, right eye  D31.31     1,2. Moderate non-proliferative diabetic retinopathy, OU  - pt reports no  subjective change in vision and is quite happy with vision  - FA 4.30.21 shows late leaking MA OU, mild vasc perfusion defects OU, no NV  - today, BCVA 20/25 OD (improved from 20/30) and 20/40 OS (stable)  - exam shows scattered MA/IRH OU (OD > OS) -- OD with perifoveal cluster of MA and edema  - OCT shows mild cystic changes / diabetic macular edema, both eyes  - discussed findings potential treatments for DME  - pt wishes to observe / defer intervention today and try to improve DME with improved BP and BG  - f/u 3 mos, DFE, OCT, possible IVA vs focal laser OD?  3,4. Hypertensive retinopathy OU  - discussed importance of tight BP control  - monitor  5. Pseudophakia OU  - s/p CE/IOL OU (Dr. Iona Hansen, 2006)  - IOLs in good position, doing well  - monitor  6. Choroidal Nevus OD  - flat, irregular pigmented lesion at 1000 OD  - +overlying drusen; no SRF or orange pigment  - monitor   Ophthalmic Meds Ordered this visit:  No orders of the defined types were placed in this encounter.      Return in about 3 months (around 01/06/2020) for 3 mo f/u for mod NPDR OU (Poss. IVA vs focal laser OD?).  There are no Patient Instructions on file for this visit.   Explained the diagnoses, plan, and follow up with the patient and they expressed understanding.  Patient expressed understanding of the importance of proper follow up care.   This document serves as a record of services personally performed by Gardiner Sleeper, MD, PhD. It was created on their behalf by Estill Bakes, COT an ophthalmic technician. The creation of this record is the provider's dictation and/or activities during the visit.    Electronically signed by: Estill Bakes, COT 10/06/19 @ 11:56 PM  Gardiner Sleeper, M.D., Ph.D. Diseases & Surgery of the Retina and [place]  I have reviewed the above documentation for accuracy and completeness, and I agree with the above. Gardiner Sleeper, M.D.,  Ph.D. 10/06/19 11:56 PM  Abbreviations: M myopia (nearsighted); A astigmatism; H hyperopia (farsighted); P presbyopia; Mrx spectacle prescription;  CTL contact lenses; OD right eye; OS left eye; OU both eyes  XT exotropia; ET esotropia; PEK punctate epithelial keratitis; PEE punctate epithelial erosions; DES dry eye syndrome; MGD meibomian gland dysfunction; ATs artificial tears; PFAT's preservative free artificial tears; [place] nuclear sclerotic cataract; PSC posterior subcapsular cataract; ERM epi-retinal membrane; PVD posterior vitreous detachment; RD retinal detachment; DM diabetes mellitus; DR diabetic retinopathy; NPDR non-proliferative diabetic retinopathy; PDR proliferative diabetic retinopathy; CSME clinically significant macular edema; DME diabetic macular edema; dbh dot blot hemorrhages; CWS cotton wool spot; POAG primary open angle glaucoma; C/D cup-to-disc ratio; HVF humphrey visual field; GVF goldmann visual field; OCT optical coherence tomography; IOP intraocular pressure; BRVO Branch retinal vein occlusion; CRVO central retinal vein occlusion; CRAO central retinal artery occlusion; BRAO branch retinal artery occlusion; RT retinal tear; SB scleral buckle; PPV pars plana vitrectomy; VH Vitreous hemorrhage; PRP panretinal laser photocoagulation; IVK  intravitreal kenalog; VMT vitreomacular traction; MH Macular hole;  NVD neovascularization of the disc; NVE neovascularization elsewhere; AREDS age related eye disease study; ARMD age related macular degeneration; POAG primary open angle glaucoma; EBMD epithelial/anterior basement membrane dystrophy; ACIOL anterior chamber intraocular lens; IOL intraocular lens; PCIOL posterior chamber intraocular lens; Phaco/IOL phacoemulsification with intraocular lens placement; [place] photorefractive keratectomy; LASIK laser assisted in situ keratomileusis; HTN hypertension; DM diabetes mellitus; COPD chronic obstructive pulmonary disease

## 2019-10-06 ENCOUNTER — Encounter (INDEPENDENT_AMBULATORY_CARE_PROVIDER_SITE_OTHER): Payer: Self-pay | Admitting: Ophthalmology

## 2019-10-06 ENCOUNTER — Ambulatory Visit (INDEPENDENT_AMBULATORY_CARE_PROVIDER_SITE_OTHER): Payer: Medicare Other | Admitting: Ophthalmology

## 2019-10-06 ENCOUNTER — Other Ambulatory Visit: Payer: Self-pay

## 2019-10-06 DIAGNOSIS — E113313 Type 2 diabetes mellitus with moderate nonproliferative diabetic retinopathy with macular edema, bilateral: Secondary | ICD-10-CM | POA: Diagnosis not present

## 2019-10-06 DIAGNOSIS — I10 Essential (primary) hypertension: Secondary | ICD-10-CM

## 2019-10-06 DIAGNOSIS — H3581 Retinal edema: Secondary | ICD-10-CM

## 2019-10-06 DIAGNOSIS — H35033 Hypertensive retinopathy, bilateral: Secondary | ICD-10-CM | POA: Diagnosis not present

## 2019-10-06 DIAGNOSIS — D3131 Benign neoplasm of right choroid: Secondary | ICD-10-CM

## 2019-10-06 DIAGNOSIS — Z961 Presence of intraocular lens: Secondary | ICD-10-CM

## 2019-10-24 ENCOUNTER — Ambulatory Visit (HOSPITAL_COMMUNITY): Payer: Medicare Other

## 2019-10-25 ENCOUNTER — Encounter: Payer: Self-pay | Admitting: "Endocrinology

## 2019-10-25 ENCOUNTER — Other Ambulatory Visit: Payer: Self-pay

## 2019-10-25 ENCOUNTER — Ambulatory Visit: Payer: Medicare Other | Admitting: "Endocrinology

## 2019-10-25 VITALS — BP 123/56 | HR 56 | Ht 61.0 in | Wt 178.0 lb

## 2019-10-25 DIAGNOSIS — E039 Hypothyroidism, unspecified: Secondary | ICD-10-CM | POA: Diagnosis not present

## 2019-10-25 DIAGNOSIS — E049 Nontoxic goiter, unspecified: Secondary | ICD-10-CM | POA: Diagnosis not present

## 2019-10-25 MED ORDER — LEVOTHYROXINE SODIUM 25 MCG PO TABS: 25.0000 ug | ORAL_TABLET | Freq: Every day | ORAL | 3 refills | Status: DC

## 2019-10-25 NOTE — Progress Notes (Signed)
10/25/2019, 5:40 PM  Endocrinology follow-up note   Subjective:    Patient ID: [name], female    DOB: [birth year], PCP Neale Burly, MD   Past Medical History:  Diagnosis Date  . Atrial fibrillation [facility])    Maze procedure September, 2010, amiodarone and Coumadin stopped eventually January, 2011  . Coronary artery disease   . Ejection fraction    EF 60%, echo, September, 2010  . Endometrial ca [facility]) 09/25/2010   June, 2012, radiation and chemotherapy, Dr. Tressie Stalker  . Fluid overload   . Hiatal hernia   . HTN (hypertension)   . Hx of CABG    2010, Dr.Van Trigt, Maze procedure, ligation of left atrial appendage, Coumadin stopped January, 2011  . Hyperlipidemia   . Noninsulin dependent diabetes mellitus   . Osteoarthritis    Severe  . Renal insufficiency    Mild with higher dose diuretic  . Shortness of breath   . Statin intolerance    Is   Past Surgical History:  Procedure Laterality Date  . ABDOMINAL HYSTERECTOMY  07/2010   complete, endometrial cancer  . APPENDECTOMY  60 yrs ago  . BREAST MASS EXCISION     left breast 1985  . CARDIAC CATHETERIZATION    . CATARACT EXTRACTION Bilateral 2006   Dr. Iona Hansen  . choleycystectomy  1998  . COLONOSCOPY     several in [place], last one by Dr. Rowe Pavy, polyps once in [place]. records requested.  . COLONOSCOPY  06/2010   Dr. Rodrigo Ran good. Moderate diverticulosis in left colon. Inflammation found in descending colon, scope could not be advanced forward.  ACBE-->sigmoid diverticulosis no worrisome findings.   . CORONARY ARTERY BYPASS GRAFT  12/21/08   Prescott Gum; LIMA LAD, SVG posterior descending, SVG OM, maze procedure, ligation left atrial appendage and MAZE procedure for A.fib  . ESOPHAGOGASTRODUODENOSCOPY  03/08/2012   BEE:FEOFHQRF ring was found at the gastroesophageal junction/Nodular gastritis (inflammation)/duodenal mucosa showed no abnormalities   .  PORT-A-CATH REMOVAL  01/23/2012   Procedure: REMOVAL PORT-A-CATH;  Surgeon: Jamesetta So, MD;  Location: [facility];  Service: General;  Laterality: N/A;  Minor Room  . PORTACATH PLACEMENT    . TONSILECTOMY, ADENOIDECTOMY, BILATERAL MYRINGOTOMY AND TUBES    . TUMOR REMOVAL     from throat  benign   Social History   Socioeconomic History  . Marital status: Widowed    Spouse name: Not on file  . Number of children: 1  . Years of education: Not on file  . Highest education level: Not on file  Occupational History  . Occupation: Retired    Fish farm manager: RETIRED  Tobacco Use  . Smoking status: Never Smoker  . Smokeless tobacco: Never Used  Substance and Sexual Activity  . Alcohol use: No    Alcohol/week: 0.0 standard drinks  . Drug use: No  . Sexual activity: Not on file  Other Topics Concern  . Not on file  Social History Narrative  . Not on file   Social Determinants of Health   Financial Resource Strain:   . Difficulty of Paying Living Expenses:   Food Insecurity:   . Worried About Charity fundraiser in the Last Year:   . [employer]  in the Last Year:   Transportation Needs:   . Film/video editor (Medical):   Marland Kitchen Lack of Transportation (Non-Medical):   Physical Activity:   . Days of Exercise per Week:   . Minutes of Exercise per Session:   Stress:   . Feeling of Stress :   Social Connections:   . Frequency of Communication with Friends and Family:   . Frequency of Social Gatherings with Friends and Family:   . Attends Religious Services:   . Active Member of Clubs or Organizations:   . Attends Archivist Meetings:   Marland Kitchen Marital Status:    Family History  Problem Relation Age of Onset  . Hypertension Mother   . Breast cancer Mother   . Heart attack Mother        deceased, age 24  . Cancer Mother   . Diabetes Sister        deceased - 36, renal failure,   . Heart attack Father   . Cancer Father   . Colon cancer Neg Hx   . Ovarian cancer Neg Hx    . Lung cancer Neg Hx   . Liver disease Neg Hx    Outpatient Encounter Medications as of 10/25/2019  Medication Sig  . alendronate (FOSAMAX) 70 MG tablet Take 70 mg by mouth once a week. Take with a full glass of water on an empty stomach.  . Calcium Carbonate (CALCIUM 500 PO) Take 500 mg by mouth 2 (two) times daily.  . Cholecalciferol (VITAMIN D PO) Take 400 Units by mouth daily.   Marland Kitchen denosumab (PROLIA) 60 MG/ML SOSY injection Inject 60 mg into the skin every 6 (six) months.  . diltiazem (CARDIZEM CD) 240 MG 24 hr capsule Take 240 mg by mouth daily.  . Diphenhydramine-APAP, sleep, (TYLENOL PM EXTRA STRENGTH PO) Take 1 tablet by mouth at bedtime as needed. Sleep  . furosemide (LASIX) 40 MG tablet Take 1 tablet (40 mg total) by mouth daily.  Marland Kitchen glipiZIDE (GLUCOTROL) 5 MG tablet Take 5 mg by mouth daily.   . Glucosamine 500 MG CAPS Take 500 mg by mouth 2 (two) times daily.    Marland Kitchen HYDROcodone-acetaminophen (NORCO/VICODIN) 5-325 MG per tablet Take 1 tablet by mouth every 8 (eight) hours as needed for moderate pain.  Marland Kitchen levothyroxine (SYNTHROID) 25 MCG tablet Take 1 tablet (25 mcg total) by mouth daily before breakfast.  . magnesium oxide (MAG-OX) 400 MG tablet Take 400 mg by mouth daily.  . metFORMIN (GLUCOPHAGE) 500 MG tablet Take 500 mg by mouth at bedtime. Once daily at bedtime  . metoprolol succinate (TOPROL-XL) 50 MG 24 hr tablet TAKE 1 AND 1/2 TABLETS BY MOUTH TWICE DAILY  . Multiple Vitamin (MULTIVITAMIN) tablet Take 1 tablet by mouth daily.  . Omega-3 Fatty Acids (FISH OIL) 1200 MG CAPS Take 1,200 mg by mouth 2 (two) times daily.  Marland Kitchen warfarin (COUMADIN) 5 MG tablet Take 5 mg by mouth daily. 7.5mg  alt w/ 5mg  every other day PER [name]'S OFFICE.   No facility-administered encounter medications on file as of 10/25/2019.   ALLERGIES: Allergies  Allergen Reactions  . Statins Other (See Comments)    No energy, fatigue.     VACCINATION STATUS: Immunization History  Administered Date(s)  Administered  . Influenza-Unspecified 01/05/2018  . PFIZER SARS-COV-2 Vaccination 04/28/2019, 05/19/2019    HPI BRYNNLEE CUMPIAN is 86 y.o. female who presents today for follow-up after she was seen in consultation for nodular goiter. PMD: Neale Burly, MD.  -  She denies any prior history of thyroid dysfunction.  Recently she was admitted to [facility] for pneumonia.  CT scan of her chest incidentally revealed a 1.5 cm nodule on the left lobe of her thyroid.  During her last visit, she was sent for thyroid ultrasound which she decided not to obtain.  Her previsit thyroid function tests are consistent with primary hypothyroidism. -She denies dysphagia, shortness of breath, nor voice change.  She did not have any recent thyroid ultrasound nor thyroid function test.  She underwent controlled type 2 diabetes with A1c of 6.1%.  She is taking Metformin and normal glipizide.  She denies any family history of thyroid malignancy.  She denies any exposure to neck radiation.  Review of Systems  Constitutional: no recent weight gain/loss, no fatigue, no subjective hyperthermia, no subjective hypothermia Eyes: no blurry vision, no xerophthalmia ENT: no sore throat, no nodules palpated in throat, no dysphagia/odynophagia, no hoarseness Cardiovascular: no Chest Pain, no Shortness of Breath, no palpitations, no leg swelling Respiratory: no cough, no shortness of breath Gastrointestinal: no Nausea/Vomiting/Diarhhea Musculoskeletal: no muscle/joint aches Skin: no rashes Neurological: no tremors, no numbness, no tingling, no dizziness Psychiatric: no depression, no anxiety  Objective:    Vitals with BMI 10/25/2019 09/29/2019 08/27/2019  Height 5\' 1"  5\' 1"  -  Weight 178 lbs 176 lbs 13 oz -  BMI 59.93 57.01 -  Systolic 779 390 300  Diastolic 56 54 72  Pulse 56 48 86    BP (!) 123/56   Pulse (!) 56   Ht 5\' 1"  (1.549 m)   Wt 178 lb (80.7 kg)   BMI 33.63 kg/m   Wt Readings from Last 3  Encounters:  10/25/19 178 lb (80.7 kg)  09/29/19 176 lb 12.8 oz (80.2 kg)  08/25/19 184 lb 1.4 oz (83.5 kg)    Physical Exam  Constitutional:  Body mass index is 33.63 kg/m.,  not in acute distress, normal state of mind Eyes: PERRLA, EOMI, no exophthalmos ENT: moist mucous membranes, no gross thyromegaly, no gross cervical lymphadenopathy Cardiovascular: normal precordial activity, Regular Rate and Rhythm, no Murmur/Rubs/Gallops Respiratory:  adequate breathing efforts, no gross chest deformity, Clear to auscultation bilaterally Gastrointestinal: abdomen soft, Non -tender, No distension, Bowel Sounds present, no gross organomegaly Musculoskeletal: + Walks with a walker due to disequilibrium, no gross deformities, strength intact in all four extremities Skin: moist, warm, no rashes Neurological: no tremor with outstretched hands, Deep tendon reflexes normal in bilateral lower extremities.  CMP ( most recent) CMP     Component Value Date/Time   NA 134 (L) 08/27/2019 0613   K 3.4 (L) 08/27/2019 0613   CL 100 08/27/2019 0613   CO2 24 08/27/2019 0613   GLUCOSE 135 (H) 08/27/2019 0613   BUN 29 (H) 08/27/2019 0613   CREATININE 0.95 08/27/2019 0613   CALCIUM 8.1 (L) 08/27/2019 0613   PROT 6.6 08/25/2019 0449   ALBUMIN 3.3 (L) 08/25/2019 0449   AST 93 (H) 08/25/2019 0449   ALT 88 (H) 08/25/2019 0449   ALKPHOS 70 08/25/2019 0449   BILITOT 1.5 (H) 08/25/2019 0449   GFRNONAA 54 (L) 08/27/2019 9233   GFRAA >60 08/27/2019 0076     Diabetic Labs (most recent): Lab Results  Component Value Date   HGBA1C 6.1 (H) 08/24/2019   HGBA1C (H) 12/19/2008    7.5 (NOTE) The ADA recommends the following therapeutic goal for glycemic control related to Hgb A1c measurement: Goal of therapy: <6.5 Hgb A1c  Reference: American Diabetes Association:  Clinical Practice Recommendations 2010, Diabetes Care, 2010, 33: (Suppl  1).   Recent Results (from the past 2160 hour(s))  Urinalysis, Routine w reflex  microscopic     Status: Abnormal   Collection Time: 08/24/19 10:39 AM  Result Value Ref Range   Color, Urine AMBER (A) YELLOW    Comment: BIOCHEMICALS MAY BE AFFECTED BY COLOR   APPearance HAZY (A) CLEAR   Specific Gravity, Urine 1.020 1.005 - 1.030   pH 7.0 5.0 - 8.0   Glucose, UA NEGATIVE NEGATIVE mg/dL   Hgb urine dipstick SMALL (A) NEGATIVE   Bilirubin Urine NEGATIVE NEGATIVE   Ketones, ur 5 (A) NEGATIVE mg/dL   Protein, ur 100 (A) NEGATIVE mg/dL   Nitrite NEGATIVE NEGATIVE   Leukocytes,Ua SMALL (A) NEGATIVE   RBC / HPF 11-20 0 - 5 RBC/hpf   WBC, UA >50 (H) 0 - 5 WBC/hpf   Bacteria, UA FEW (A) NONE SEEN   Squamous Epithelial / LPF 0-5 0 - 5   Budding Yeast PRESENT     Comment: Performed at [facility], [address], [place], [place] [address]  Comprehensive metabolic panel     Status: Abnormal   Collection Time: 08/24/19  7:16 PM  Result Value Ref Range   Sodium 133 (L) 135 - 145 mmol/L   Potassium 4.2 3.5 - 5.1 mmol/L   Chloride 98 98 - 111 mmol/L   CO2 23 22 - 32 mmol/L   Glucose, Bld 192 (H) 70 - 99 mg/dL    Comment: Glucose reference range applies only to samples taken after fasting for at least 8 hours.   BUN 30 (H) 8 - 23 mg/dL   Creatinine, Ser 1.00 0.44 - 1.00 mg/dL   Calcium 9.0 8.9 - 10.3 mg/dL   Total Protein 7.3 6.5 - 8.1 g/dL   Albumin 3.9 3.5 - 5.0 g/dL   AST 71 (H) 15 - 41 U/L   ALT 64 (H) 0 - 44 U/L   Alkaline Phosphatase 75 38 - 126 U/L   Total Bilirubin 1.0 0.3 - 1.2 mg/dL   GFR calc non Af Amer 51 (L) >60 mL/min   GFR calc Af Amer 59 (L) >60 mL/min   Anion gap 12 5 - 15    Comment: Performed at [facility], [address], [place], [place] [address]  Lactic acid, plasma     Status: None   Collection Time: 08/24/19  7:16 PM  Result Value Ref Range   Lactic Acid, Venous 1.5 0.5 - 1.9 mmol/L    Comment: Performed at [facility], [address], [place], [place] [address]  CBC with Differential     Status: Abnormal   Collection Time: 08/24/19   7:16 PM  Result Value Ref Range   WBC 11.7 (H) 4.0 - 10.5 K/uL   RBC 3.76 (L) 3.87 - 5.11 MIL/uL   Hemoglobin 11.2 (L) 12.0 - 15.0 g/dL   HCT 34.5 (L) 36 - 46 %   MCV 91.8 80.0 - 100.0 fL   MCH 29.8 26.0 - 34.0 pg   MCHC 32.5 30.0 - 36.0 g/dL   RDW 13.2 11.5 - 15.5 %   Platelets 169 150 - 400 K/uL   nRBC 0.0 0.0 - 0.2 %   Neutrophils Relative % 95 %   Neutro Abs 11.0 (H) 1.7 - 7.7 K/uL   Lymphocytes Relative 2 %   Lymphs Abs 0.2 (L) 0.7 - 4.0 K/uL   Monocytes Relative 3 %   Monocytes Absolute 0.4 0 -  1 K/uL   Eosinophils Relative 0 %   Eosinophils Absolute 0.0 0 - 0 K/uL   Basophils Relative 0 %   Basophils Absolute 0.0 0 - 0 K/uL   Immature Granulocytes 0 %   Abs Immature Granulocytes 0.05 0.00 - 0.07 K/uL    Comment: Performed at [facility], [address] [place], [place] [address]  Protime-INR     Status: Abnormal   Collection Time: 08/24/19  7:16 PM  Result Value Ref Range   Prothrombin Time 22.7 (H) 11.4 - 15.2 seconds   INR 2.1 (H) 0.8 - 1.2    Comment: (NOTE) INR goal varies based on device and disease states. Performed at [facility], [address] [place], [place] [address]   Culture, blood (Routine x 2)     Status: Abnormal   Collection Time: 08/24/19  7:16 PM   Specimen: Right Antecubital; Blood  Result Value Ref Range   Specimen Description      RIGHT ANTECUBITAL Performed at [facility], [address] [place], [place] [address]    Special Requests      BOTTLES DRAWN AEROBIC AND ANAEROBIC Blood Culture adequate volume Performed at [facility], [address], [place], [place] [address]    Culture  Setup Time      GRAM POSITIVE COCCI Gram Stain Report Called to,Read Back By and Verified With: WRIGHT,E@1623  BY MATTHEWS, B 5.20.21 IN BOTH AEROBIC AND ANAEROBIC BOTTLES CRITICAL RESULT CALLED TO, READ BACK BY AND VERIFIED WITH: TDarrow Bussing [id number] 08/26/2019 Mena Goes Performed at [facility], [address], [place], [place] [address]    Culture (A)      STREPTOCOCCUS MITIS/ORALIS SUSCEPTIBILITIES PERFORMED ON PREVIOUS CULTURE WITHIN THE LAST 5 DAYS. Performed at [facility], [place] [address] [place], [place] [address]    Report Status 08/27/2019 FINAL   HIV Antibody (routine testing w rflx)     Status: None   Collection Time: 08/24/19  7:16 PM  Result Value Ref Range   HIV Screen 4th Generation wRfx Non Reactive Non Reactive    Comment: Performed at [facility], [place] [address], [place], [place] [address]  Hemoglobin A1c     Status: Abnormal   Collection Time: 08/24/19  7:16 PM  Result Value Ref Range   Hgb A1c MFr Bld 6.1 (H) 4.8 - 5.6 %    Comment: (NOTE) Pre diabetes:          5.7%-6.4% Diabetes:              >6.4% Glycemic control for   <7.0% adults with diabetes    Mean Plasma Glucose 128.37 mg/dL    Comment: Performed at [place] [address] [place], [place] [address]  Culture, blood (Routine x 2)     Status: Abnormal   Collection Time: 08/24/19  7:29 PM   Specimen: Left Antecubital; Blood  Result Value Ref Range   Specimen Description      LEFT ANTECUBITAL Performed at [facility], [address] [place], [place] [address]    Special Requests      BOTTLES DRAWN AEROBIC AND ANAEROBIC Blood Culture adequate volume Performed at [facility], [address] [place], [place] [address]    Culture  Setup Time      GRAM POSITIVE COCCI Gram Stain Report Called to,Read Back By and Verified With: WRIGHT,E@1623  BY MATTHEWS, B 5.20.21 IN BOTH AEROBIC AND ANAEROBIC BOTTLES CRITICAL RESULT CALLED TO, READ BACK BY AND VERIFIED WITH:  Quin Hoop [id number] 08/26/2019 Mena Goes Performed at [facility], [place] [address] [place], [place] [address]    Culture STREPTOCOCCUS MITIS/ORALIS (A)    Report Status 08/27/2019 FINAL    Organism ID, Bacteria STREPTOCOCCUS MITIS/ORALIS       Susceptibility   Streptococcus mitis/oralis - MIC*    TETRACYCLINE >=16 RESISTANT Resistant     VANCOMYCIN 0.5 SENSITIVE  Sensitive     CLINDAMYCIN >=1 RESISTANT Resistant     CEFTRIAXONE Value in next row Sensitive      SENSITIVE<=0.12    PENICILLIN Value in next row Sensitive      SENSITIVE<=0.06    * STREPTOCOCCUS MITIS/ORALIS  Blood Culture ID Panel (Reflexed)     Status: Abnormal   Collection Time: 08/24/19  7:29 PM  Result Value Ref Range   Enterococcus species NOT DETECTED NOT DETECTED   Listeria monocytogenes NOT DETECTED NOT DETECTED   Staphylococcus species NOT DETECTED NOT DETECTED   Staphylococcus aureus (BCID) NOT DETECTED NOT DETECTED   Streptococcus species DETECTED (A) NOT DETECTED    Comment: Not Enterococcus species, Streptococcus agalactiae, Streptococcus pyogenes, or Streptococcus pneumoniae. CRITICAL RESULT CALLED TO, READ BACK BY AND VERIFIED WITH: T. MAYO,RN 0115 08/26/2019 T. TYSOR    Streptococcus agalactiae NOT DETECTED NOT DETECTED   Streptococcus pneumoniae NOT DETECTED NOT DETECTED   Streptococcus pyogenes NOT DETECTED NOT DETECTED   Acinetobacter baumannii NOT DETECTED NOT DETECTED   Enterobacteriaceae species NOT DETECTED NOT DETECTED   Enterobacter cloacae complex NOT DETECTED NOT DETECTED   Escherichia coli NOT DETECTED NOT DETECTED   Klebsiella oxytoca NOT DETECTED NOT DETECTED   Klebsiella pneumoniae NOT DETECTED NOT DETECTED   Proteus species NOT DETECTED NOT DETECTED   Serratia marcescens NOT DETECTED NOT DETECTED   Haemophilus influenzae NOT DETECTED NOT DETECTED   Neisseria meningitidis NOT DETECTED NOT DETECTED   Pseudomonas aeruginosa NOT DETECTED NOT DETECTED   Candida albicans NOT DETECTED NOT DETECTED   Candida glabrata NOT DETECTED NOT DETECTED   Candida krusei NOT DETECTED NOT DETECTED   Candida parapsilosis NOT DETECTED NOT DETECTED   Candida tropicalis NOT DETECTED NOT DETECTED    Comment: Performed at [facility], [address], [place], [place] [address]  Lactic acid, plasma     Status: None   Collection Time: 08/24/19  9:00 PM  Result  Value Ref Range   Lactic Acid, Venous 1.5 0.5 - 1.9 mmol/L    Comment: Performed at [facility], [address] [place], [place] [address]  SARS Coronavirus 2 by RT PCR (hospital order, performed in [facility] hospital lab) Nasopharyngeal Nasopharyngeal Swab     Status: None   Collection Time: 08/24/19  9:27 PM   Specimen: Nasopharyngeal Swab  Result Value Ref Range   SARS Coronavirus 2 NEGATIVE NEGATIVE    Comment: (NOTE) SARS-CoV-2 target nucleic acids are NOT DETECTED. The SARS-CoV-2 RNA is generally detectable in upper and lower respiratory specimens during the acute phase of infection. The lowest concentration of SARS-CoV-2 viral copies this assay can detect is 250 copies / mL. A negative result does not preclude SARS-CoV-2 infection and should not be used as the sole basis for treatment or other patient management decisions.  A negative result may occur with improper specimen collection / handling, submission of specimen other than nasopharyngeal swab, presence of viral mutation(s) within the areas targeted by this assay, and inadequate number of viral copies (<250 copies / mL). A negative result must be combined with clinical observations, patient  history, and epidemiological information. Fact Sheet for Patients:   StrictlyIdeas.no Fact Sheet for Healthcare Providers: BankingDealers.co.za This test is not yet approved or cleared  by the [place] FDA and has been authorized for detection and/or diagnosis of SARS-CoV-2 by FDA under an Emergency Use Authorization (EUA).  This EUA will remain in effect (meaning this test can be used) for the duration of the COVID-19 declaration under Section 564(b)(1) of the Act, 21 U.S.C. section 360bbb-3(b)(1), unless the authorization is terminated or revoked sooner. Performed at [facility], [address], [place], [place] [address]   Glucose, capillary     Status: Abnormal   Collection  Time: 08/25/19 12:45 AM  Result Value Ref Range   Glucose-Capillary 189 (H) 70 - 99 mg/dL    Comment: Glucose reference range applies only to samples taken after fasting for at least 8 hours.  Comprehensive metabolic panel     Status: Abnormal   Collection Time: 08/25/19  4:49 AM  Result Value Ref Range   Sodium 134 (L) 135 - 145 mmol/L   Potassium 4.2 3.5 - 5.1 mmol/L   Chloride 100 98 - 111 mmol/L   CO2 22 22 - 32 mmol/L   Glucose, Bld 232 (H) 70 - 99 mg/dL    Comment: Glucose reference range applies only to samples taken after fasting for at least 8 hours.   BUN 31 (H) 8 - 23 mg/dL   Creatinine, Ser 1.04 (H) 0.44 - 1.00 mg/dL   Calcium 8.5 (L) 8.9 - 10.3 mg/dL   Total Protein 6.6 6.5 - 8.1 g/dL   Albumin 3.3 (L) 3.5 - 5.0 g/dL   AST 93 (H) 15 - 41 U/L   ALT 88 (H) 0 - 44 U/L   Alkaline Phosphatase 70 38 - 126 U/L   Total Bilirubin 1.5 (H) 0.3 - 1.2 mg/dL   GFR calc non Af Amer 49 (L) >60 mL/min   GFR calc Af Amer 56 (L) >60 mL/min   Anion gap 12 5 - 15    Comment: Performed at [facility], [address] [place], [place] [address]  CBC     Status: Abnormal   Collection Time: 08/25/19  4:49 AM  Result Value Ref Range   WBC 11.3 (H) 4.0 - 10.5 K/uL   RBC 3.48 (L) 3.87 - 5.11 MIL/uL   Hemoglobin 10.6 (L) 12.0 - 15.0 g/dL   HCT 32.3 (L) 36 - 46 %   MCV 92.8 80.0 - 100.0 fL   MCH 30.5 26.0 - 34.0 pg   MCHC 32.8 30.0 - 36.0 g/dL   RDW 13.4 11.5 - 15.5 %   Platelets 138 (L) 150 - 400 K/uL   nRBC 0.0 0.0 - 0.2 %    Comment: Performed at [facility], [address], [place], [place] [address]  Brain natriuretic peptide     Status: Abnormal   Collection Time: 08/25/19  4:49 AM  Result Value Ref Range   B Natriuretic Peptide 583.0 (H) 0.0 - 100.0 pg/mL    Comment: Performed at [facility], [address], [place], [place] [address]  Procalcitonin - Baseline     Status: None   Collection Time: 08/25/19  4:49 AM  Result Value Ref Range   Procalcitonin 1.97 ng/mL    Comment:         Interpretation: PCT > 0.5 ng/mL and <= 2 ng/mL: Systemic infection (sepsis) is possible, but other conditions are known to elevate PCT as well. (NOTE)  Sepsis PCT Algorithm           Lower Respiratory Tract                                      Infection PCT Algorithm    ----------------------------     ----------------------------         PCT < 0.25 ng/mL                PCT < 0.10 ng/mL         Strongly encourage             Strongly discourage   discontinuation of antibiotics    initiation of antibiotics    ----------------------------     -----------------------------       PCT 0.25 - 0.50 ng/mL            PCT 0.10 - 0.25 ng/mL               OR       >80% decrease in PCT            Discourage initiation of                                            antibiotics      Encourage discontinuation           of antibiotics    ----------------------------     -----------------------------         PCT >= 0.50 ng/mL              PCT 0.26 - 0.50 ng/mL                AND       <80% decrease in PCT             Encourage initiation of                                             antibiotics       Encourage continuation           of antibiotics    ----------------------------     -----------------------------        PCT >= 0.50 ng/mL                  PCT > 0.50 ng/mL               AND         increase in PCT                  Strongly encourage                                      initiation of antibiotics    Strongly encourage escalation           of antibiotics                                     -----------------------------  PCT <= 0.25 ng/mL                                                 OR                                        > 80% decrease in PCT                                     Discontinue / Do not initiate                                             antibiotics Performed at [facility], [address] [place], [place]  [address]   Magnesium     Status: None   Collection Time: 08/25/19  4:49 AM  Result Value Ref Range   Magnesium 1.8 1.7 - 2.4 mg/dL    Comment: Performed at [facility], [address] [place], [place] [address]  Glucose, capillary     Status: Abnormal   Collection Time: 08/25/19  7:22 AM  Result Value Ref Range   Glucose-Capillary 203 (H) 70 - 99 mg/dL    Comment: Glucose reference range applies only to samples taken after fasting for at least 8 hours.  Protime-INR     Status: Abnormal   Collection Time: 08/25/19 10:34 AM  Result Value Ref Range   Prothrombin Time 21.3 (H) 11.4 - 15.2 seconds   INR 1.9 (H) 0.8 - 1.2    Comment: (NOTE) INR goal varies based on device and disease states. Performed at [facility], [address], [place], [place] [address]   Legionella Pneumophila Serogp 1 Ur Ag     Status: None   Collection Time: 08/25/19 10:38 AM  Result Value Ref Range   L. pneumophila Serogp 1 Ur Ag Negative Negative    Comment: (NOTE) Presumptive negative for L. pneumophila serogroup 1 antigen in urine, suggesting no recent or current infection. Legionnaires' disease cannot be ruled out since other serogroups and species may also cause disease. Performed At: [facility] [place], [place] [id number] Rush Farmer MD [id number]    Source of Sample URINE, RANDOM     Comment: Performed at [facility], [address], [place], [place] [address]  Glucose, capillary     Status: Abnormal   Collection Time: 08/25/19 10:56 AM  Result Value Ref Range   Glucose-Capillary 217 (H) 70 - 99 mg/dL    Comment: Glucose reference range applies only to samples taken after fasting for at least 8 hours.  ECHOCARDIOGRAM COMPLETE     Status: None   Collection Time: 08/25/19  2:09 PM  Result Value Ref Range   Weight 2,945.35 oz   Height 61 in   BP 133/77 mmHg  Glucose, capillary     Status: Abnormal   Collection Time: 08/25/19  4:03 PM  Result Value Ref Range    Glucose-Capillary 207 (H) 70 - 99 mg/dL    Comment: Glucose reference range applies only to samples taken after fasting for at least 8  hours.   Comment 1 Notify RN    Comment 2 Document in Chart   Glucose, capillary     Status: Abnormal   Collection Time: 08/25/19  9:14 PM  Result Value Ref Range   Glucose-Capillary 118 (H) 70 - 99 mg/dL    Comment: Glucose reference range applies only to samples taken after fasting for at least 8 hours.   Comment 1 Notify RN    Comment 2 Document in Chart   Basic metabolic panel     Status: Abnormal   Collection Time: 08/26/19  4:50 AM  Result Value Ref Range   Sodium 134 (L) 135 - 145 mmol/L   Potassium 3.9 3.5 - 5.1 mmol/L   Chloride 100 98 - 111 mmol/L   CO2 22 22 - 32 mmol/L   Glucose, Bld 125 (H) 70 - 99 mg/dL    Comment: Glucose reference range applies only to samples taken after fasting for at least 8 hours.   BUN 35 (H) 8 - 23 mg/dL   Creatinine, Ser 1.19 (H) 0.44 - 1.00 mg/dL   Calcium 8.3 (L) 8.9 - 10.3 mg/dL   GFR calc non Af Amer 41 (L) >60 mL/min   GFR calc Af Amer 48 (L) >60 mL/min   Anion gap 12 5 - 15    Comment: Performed at [facility], [address], [place], [place] [address]  CBC     Status: Abnormal   Collection Time: 08/26/19  4:50 AM  Result Value Ref Range   WBC 7.6 4.0 - 10.5 K/uL   RBC 3.35 (L) 3.87 - 5.11 MIL/uL   Hemoglobin 9.9 (L) 12.0 - 15.0 g/dL   HCT 30.9 (L) 36 - 46 %   MCV 92.2 80.0 - 100.0 fL   MCH 29.6 26.0 - 34.0 pg   MCHC 32.0 30.0 - 36.0 g/dL   RDW 13.4 11.5 - 15.5 %   Platelets 121 (L) 150 - 400 K/uL   nRBC 0.0 0.0 - 0.2 %    Comment: Performed at [facility], [address], [place], [place] [address]  Protime-INR     Status: Abnormal   Collection Time: 08/26/19  4:50 AM  Result Value Ref Range   Prothrombin Time 19.7 (H) 11.4 - 15.2 seconds   INR 1.7 (H) 0.8 - 1.2    Comment: (NOTE) INR goal varies based on device and disease states. Performed at [facility], [address]  [place], [place] [address]   Glucose, capillary     Status: Abnormal   Collection Time: 08/26/19  7:29 AM  Result Value Ref Range   Glucose-Capillary 107 (H) 70 - 99 mg/dL    Comment: Glucose reference range applies only to samples taken after fasting for at least 8 hours.  Glucose, capillary     Status: Abnormal   Collection Time: 08/26/19 11:12 AM  Result Value Ref Range   Glucose-Capillary 170 (H) 70 - 99 mg/dL    Comment: Glucose reference range applies only to samples taken after fasting for at least 8 hours.  Glucose, capillary     Status: Abnormal   Collection Time: 08/26/19  4:30 PM  Result Value Ref Range   Glucose-Capillary 145 (H) 70 - 99 mg/dL    Comment: Glucose reference range applies only to samples taken after fasting for at least 8 hours.  Glucose, capillary     Status: Abnormal   Collection Time: 08/26/19  9:51 PM  Result Value Ref Range   Glucose-Capillary 157 (H)  70 - 99 mg/dL    Comment: Glucose reference range applies only to samples taken after fasting for at least 8 hours.  Protime-INR     Status: Abnormal   Collection Time: 08/27/19  6:13 AM  Result Value Ref Range   Prothrombin Time 17.1 (H) 11.4 - 15.2 seconds   INR 1.4 (H) 0.8 - 1.2    Comment: (NOTE) INR goal varies based on device and disease states. Performed at [facility], [address], [place], [place] [address]   Basic metabolic panel     Status: Abnormal   Collection Time: 08/27/19  6:13 AM  Result Value Ref Range   Sodium 134 (L) 135 - 145 mmol/L   Potassium 3.4 (L) 3.5 - 5.1 mmol/L   Chloride 100 98 - 111 mmol/L   CO2 24 22 - 32 mmol/L   Glucose, Bld 135 (H) 70 - 99 mg/dL    Comment: Glucose reference range applies only to samples taken after fasting for at least 8 hours.   BUN 29 (H) 8 - 23 mg/dL   Creatinine, Ser 0.95 0.44 - 1.00 mg/dL   Calcium 8.1 (L) 8.9 - 10.3 mg/dL   GFR calc non Af Amer 54 (L) >60 mL/min   GFR calc Af Amer >60 >60 mL/min   Anion gap 10 5 - 15    Comment:  Performed at [facility], [address] [place], [place] [address]  Magnesium     Status: None   Collection Time: 08/27/19  6:13 AM  Result Value Ref Range   Magnesium 2.0 1.7 - 2.4 mg/dL    Comment: Performed at [facility], [address], [place], [place] [address]  CBC     Status: Abnormal   Collection Time: 08/27/19  6:13 AM  Result Value Ref Range   WBC 6.7 4.0 - 10.5 K/uL   RBC 3.27 (L) 3.87 - 5.11 MIL/uL   Hemoglobin 9.9 (L) 12.0 - 15.0 g/dL   HCT 30.3 (L) 36 - 46 %   MCV 92.7 80.0 - 100.0 fL   MCH 30.3 26.0 - 34.0 pg   MCHC 32.7 30.0 - 36.0 g/dL   RDW 13.3 11.5 - 15.5 %   Platelets 134 (L) 150 - 400 K/uL   nRBC 0.0 0.0 - 0.2 %    Comment: Performed at [facility], [address], [place], [place] [address]  Glucose, capillary     Status: Abnormal   Collection Time: 08/27/19  7:20 AM  Result Value Ref Range   Glucose-Capillary 153 (H) 70 - 99 mg/dL    Comment: Glucose reference range applies only to samples taken after fasting for at least 8 hours.  Glucose, capillary     Status: Abnormal   Collection Time: 08/27/19 11:28 AM  Result Value Ref Range   Glucose-Capillary 146 (H) 70 - 99 mg/dL    Comment: Glucose reference range applies only to samples taken after fasting for at least 8 hours.  TSH     Status: Abnormal   Collection Time: 09/29/19  2:28 PM  Result Value Ref Range   TSH 6.81 (H) 0.40 - 4.50 mIU/L  T4, free     Status: None   Collection Time: 09/29/19  2:28 PM  Result Value Ref Range   Free T4 1.1 0.8 - 1.8 ng/dL  T3, free     Status: None   Collection Time: 09/29/19  2:28 PM  Result Value Ref Range   T3, Free 3.0 2.3 - 4.2 pg/mL  Thyroid peroxidase antibody     Status: None   Collection Time: 09/29/19  2:28 PM  Result Value Ref Range   Thyroperoxidase Ab SerPl-aCnc 1 <9 IU/mL  Thyroglobulin antibody     Status: None   Collection Time: 09/29/19  2:28 PM  Result Value Ref Range   Thyroglobulin Ab <1 < or = 1 IU/mL    Assessment & Plan:    1.   Hypothyroidism Her work-up confirms mild primary hypothyroidism.  She would benefit from early initiation of thyroid hormone.  I discussed and initiated levothyroxine 25 mcg p.o. daily before breakfast.   - We discussed about the correct intake of her thyroid hormone, on empty stomach at fasting, with water, separated by at least 30 minutes from breakfast and other medications,  and separated by more than 4 hours from calcium, iron, multivitamins, acid reflux medications (PPIs). -Patient is made aware of the fact that thyroid hormone replacement is needed for life, dose to be adjusted by periodic monitoring of thyroid function tests.  2. Nodular goiter  - - Based on review of her CT scan which was performed in the hospital, she has incidentally discovered 1.5 cm left thyroid lobe nodule,  however, she will need more appropriate imaging of the thyroid with ultrasound.  She hesitated to obtain this imaging before this visit, however agrees to obtain it anytime for now.  She will be contacted if there are significant findings.    - she is advised to maintain close follow up with Neale Burly, MD for primary care needs.      - Time spent on this patient care encounter:  20 minutes of which 50% was spent in  counseling and the rest reviewing  her current and  previous labs / studies and medications  doses and developing a plan for long term care. [name]  participated in the discussions, expressed understanding, and voiced agreement with the above plans.  All questions were answered to her satisfaction. she is encouraged to contact clinic should she have any questions or concerns prior to her return visit.   Follow up plan: Return in about 3 months (around 01/25/2020) for F/U with Pre-visit Labs, Thyroid / Neck Ultrasound.   Glade Lloyd, MD [facility] [address] [place], [place] [address] Phone: [phone]  Fax: [phone]      10/25/2019, 5:40 PM  This note was partially dictated with voice recognition software. Similar sounding words can be transcribed inadequately or may not  be corrected upon review.

## 2019-11-04 ENCOUNTER — Ambulatory Visit (HOSPITAL_COMMUNITY)
Admission: RE | Admit: 2019-11-04 | Discharge: 2019-11-04 | Disposition: A | Discharge status: Home / Self Care | Payer: Medicare Other | Source: Ambulatory Visit | Attending: "Endocrinology | Admitting: "Endocrinology

## 2019-11-04 ENCOUNTER — Other Ambulatory Visit: Payer: Self-pay

## 2019-11-04 DIAGNOSIS — E042 Nontoxic multinodular goiter: Secondary | ICD-10-CM | POA: Diagnosis not present

## 2019-11-04 DIAGNOSIS — E049 Nontoxic goiter, unspecified: Secondary | ICD-10-CM | POA: Diagnosis not present

## 2019-11-04 DIAGNOSIS — I480 Paroxysmal atrial fibrillation: Secondary | ICD-10-CM | POA: Diagnosis not present

## 2019-12-02 DIAGNOSIS — I480 Paroxysmal atrial fibrillation: Secondary | ICD-10-CM | POA: Diagnosis not present

## 2019-12-29 DIAGNOSIS — I10 Essential (primary) hypertension: Secondary | ICD-10-CM | POA: Diagnosis not present

## 2019-12-29 DIAGNOSIS — E1121 Type 2 diabetes mellitus with diabetic nephropathy: Secondary | ICD-10-CM | POA: Diagnosis not present

## 2019-12-29 DIAGNOSIS — E782 Mixed hyperlipidemia: Secondary | ICD-10-CM | POA: Diagnosis not present

## 2019-12-29 DIAGNOSIS — I480 Paroxysmal atrial fibrillation: Secondary | ICD-10-CM | POA: Diagnosis not present

## 2020-01-05 NOTE — Progress Notes (Addendum)
[facility] Note  01/10/2020     CHIEF COMPLAINT Patient presents for Retina Follow Up   HISTORY OF PRESENT ILLNESS: [name] is a 86 y.o. female who presents to the clinic today for:   HPI    Retina Follow Up    Patient presents with  Diabetic Retinopathy.  In both eyes.  This started months ago.  Severity is moderate.  Duration of months.  Since onset it is stable.  I, the attending physician,  performed the HPI with the patient and updated documentation appropriately.          Comments    Pt states her vision is the same and states she has not seen any changes.  Patient denies eye pain or discomfort and denies any new or worsening floaters or fol OU.       Last edited by Bernarda Caffey, MD on 01/10/2020 10:04 AM. (History)    pt states no change in vision  Referring physician: Leticia Clas, [place] [address] [place],  [place] [address]  HISTORICAL INFORMATION:   Selected notes from the MEDICAL RECORD NUMBER Referred by Dr. Leticia Clas for concern of NPDR with mac edema OU LEE: 12.29.20 Leander Rams) [BCVA: OD: 20/30- OS: 20/40-] Ocular Hx-pseduo OU Iona Hansen, '06), PC fibrosis, PCO, PVD, asteroid OU, nevus OD, drusen OU, HTN ret OU PMH-   CURRENT MEDICATIONS: Current Outpatient Medications (Ophthalmic Drugs)  Medication Sig  . prednisoLONE acetate (PRED FORTE) 1 % ophthalmic suspension Place 1 drop into the right eye 4 (four) times daily for 7 days.   No current facility-administered medications for this visit. (Ophthalmic Drugs)   Current Outpatient Medications (Other)  Medication Sig  . alendronate (FOSAMAX) 70 MG tablet Take 70 mg by mouth once a week. Take with a full glass of water on an empty stomach.  . Calcium Carbonate (CALCIUM 500 PO) Take 500 mg by mouth 2 (two) times daily.  . Cholecalciferol (VITAMIN D PO) Take 400 Units by mouth daily.   Marland Kitchen denosumab (PROLIA) 60 MG/ML SOSY injection Inject 60 mg into the skin  every 6 (six) months.  . diltiazem (CARDIZEM CD) 240 MG 24 hr capsule Take 240 mg by mouth daily.  . Diphenhydramine-APAP, sleep, (TYLENOL PM EXTRA STRENGTH PO) Take 1 tablet by mouth at bedtime as needed. Sleep  . furosemide (LASIX) 40 MG tablet Take 1 tablet (40 mg total) by mouth daily.  Marland Kitchen glipiZIDE (GLUCOTROL) 5 MG tablet Take 5 mg by mouth daily.   . Glucosamine 500 MG CAPS Take 500 mg by mouth 2 (two) times daily.    Marland Kitchen HYDROcodone-acetaminophen (NORCO/VICODIN) 5-325 MG per tablet Take 1 tablet by mouth every 8 (eight) hours as needed for moderate pain.  Marland Kitchen levothyroxine (SYNTHROID) 25 MCG tablet Take 1 tablet (25 mcg total) by mouth daily before breakfast.  . magnesium oxide (MAG-OX) 400 MG tablet Take 400 mg by mouth daily.  . metFORMIN (GLUCOPHAGE) 500 MG tablet Take 500 mg by mouth at bedtime. Once daily at bedtime  . metoprolol succinate (TOPROL-XL) 50 MG 24 hr tablet TAKE 1 AND 1/2 TABLETS BY MOUTH TWICE DAILY  . Multiple Vitamin (MULTIVITAMIN) tablet Take 1 tablet by mouth daily.  . Omega-3 Fatty Acids (FISH OIL) 1200 MG CAPS Take 1,200 mg by mouth 2 (two) times daily.  Marland Kitchen warfarin (COUMADIN) 5 MG tablet Take 5 mg by mouth daily. 7.28m alt w/ 571mevery other day PER [name]'S OFFICE.  No current facility-administered medications for this visit. (Other)      REVIEW OF SYSTEMS: ROS    Positive for: Gastrointestinal, Endocrine, Cardiovascular, Eyes   Negative for: Constitutional, Neurological, Skin, Genitourinary, Musculoskeletal, HENT, Respiratory, Psychiatric, Allergic/Imm, Heme/Lymph   Last edited by Doneen Poisson on 01/10/2020  9:35 AM. (History)       ALLERGIES Allergies  Allergen Reactions  . Statins Other (See Comments)    No energy, fatigue.     PAST MEDICAL HISTORY Past Medical History:  Diagnosis Date  . Atrial fibrillation [facility])    Maze procedure September, 2010, amiodarone and Coumadin stopped eventually January, 2011  . Coronary artery disease   .  Ejection fraction    EF 60%, echo, September, 2010  . Endometrial ca [facility]) 09/25/2010   June, 2012, radiation and chemotherapy, Dr. Tressie Stalker  . Fluid overload   . Hiatal hernia   . HTN (hypertension)   . Hx of CABG    2010, Dr.Van Trigt, Maze procedure, ligation of left atrial appendage, Coumadin stopped January, 2011  . Hyperlipidemia   . Noninsulin dependent diabetes mellitus   . Osteoarthritis    Severe  . Renal insufficiency    Mild with higher dose diuretic  . Shortness of breath   . Statin intolerance    Is   Past Surgical History:  Procedure Laterality Date  . ABDOMINAL HYSTERECTOMY  07/2010   complete, endometrial cancer  . APPENDECTOMY  60 yrs ago  . BREAST MASS EXCISION     left breast 1985  . CARDIAC CATHETERIZATION    . CATARACT EXTRACTION Bilateral 2006   Dr. Iona Hansen  . choleycystectomy  1998  . COLONOSCOPY     several in [place], last one by Dr. Rowe Pavy, polyps once in [place]. records requested.  . COLONOSCOPY  06/2010   Dr. Rodrigo Ran good. Moderate diverticulosis in left colon. Inflammation found in descending colon, scope could not be advanced forward.  ACBE-->sigmoid diverticulosis no worrisome findings.   . CORONARY ARTERY BYPASS GRAFT  12/21/08   Pres[place] Gum; LIMA LAD, SVG posterior descending, SVG OM, maze procedure, ligation left atrial appendage and MAZE procedure for A.fib  . ESOPHAGOGASTRODUODENOSCOPY  03/08/2012   [id number] ring was found at the gastroesophageal junction/Nodular gastritis (inflammation)/duodenal mucosa showed no abnormalities   . PORT-A-CATH REMOVAL  01/23/2012   Procedure: REMOVAL PORT-A-CATH;  Surgeon: Jamesetta So, MD;  Location: [facility];  Service: General;  Laterality: N/A;  Minor Room  . PORTACATH PLACEMENT    . TONSILECTOMY, ADENOIDECTOMY, BILATERAL MYRINGOTOMY AND TUBES    . TUMOR REMOVAL     from throat  benign    FAMILY HISTORY Family History  Problem Relation Age of Onset  . Hypertension Mother   . Breast cancer  Mother   . Heart attack Mother        deceased, age 1  . Cancer Mother   . Diabetes Sister        deceased - 9, renal failure,   . Heart attack Father   . Cancer Father   . Colon cancer Neg Hx   . Ovarian cancer Neg Hx   . Lung cancer Neg Hx   . Liver disease Neg Hx     SOCIAL HISTORY Social History   Tobacco Use  . Smoking status: Never Smoker  . Smokeless tobacco: Never Used  Substance Use Topics  . Alcohol use: No    Alcohol/week: 0.0 standard drinks  . Drug use: No  OPHTHALMIC EXAM:  Base Eye Exam    Visual Acuity (Snellen - Linear)      Right Left   Dist cc 20/40 -2 20/50 +2   Dist ph cc 20/30 -1 20/40 -2       Tonometry (Tonopen, 9:38 AM)      Right Left   Pressure 14 11       Pupils      Dark Light Shape React APD   Right 3 2 Round Minimal 0   Left 3 2 Round         Visual Fields      Left Right    Full Full       Extraocular Movement      Right Left    Full, Nystagmus Full, Nystagmus       Neuro/Psych    Oriented x3: Yes   Mood/Affect: Normal       Dilation    Both eyes: 1.0% Mydriacyl, 2.5% Phenylephrine @ 9:38 AM        Slit Lamp and Fundus Exam    Slit Lamp Exam      Right Left   Lids/Lashes Dermatochalasis - upper lid Dermatochalasis - upper lid   Conjunctiva/Sclera White and quiet White and quiet   Cornea Trace Punctate epithelial erosions, mild EBMD 1+ Punctate epithelial erosions, mild EBMD   Anterior Chamber Deep and quiet Deep and quiet   Iris Round and dilated, No NVI Round and dilated, No NVI   Lens PC IOL in good position, 2+ Posterior capsular opacification approaching center PC IOL in good position, mild anterior capsular phimosis, trace, non-central Posterior capsular opacification   Vitreous Vitreous syneresis, Posterior vitreous detachment Vitreous syneresis, Posterior vitreous detachment       Fundus Exam      Right Left   Disc Tilted disc, mild temporal Pallor, temporal PPA Compact, mild Pallor,  mildly Tilted disc, mild PPE   C/D Ratio 0.3 0.3   Macula Flat, Blunted foveal reflex, cluster of Microaneurysms IT and temporal to fovea--persistent, +cystic changes - increased Flat, Blunted foveal reflex, mild RPE mottling and clumping, scattered Microaneurysms, mild cluster inferior to fovea--improving   Vessels Vascular attenuation, Tortuous Vascular attenuation, Tortuous, mild AV crossing changes   Periphery Attached, scattered MA/DBH and peripheral drusen, ?irregular nevus at 1000 w/overlying drusen--stable Attached, rare MA/DBH, scattered peripheral drusen        Refraction    Wearing Rx      Sphere Cylinder Axis   Right -1.50 +0.75 023   Left [place] +0.50 105          IMAGING AND PROCEDURES  Imaging and Procedures for _0 @  OCT, Retina - OU - Both Eyes       Right Eye Quality was good. Central Foveal Thickness: 272. Progression has worsened. Findings include abnormal foveal contour, intraretinal fluid, no SRF (Mild interval increase in IRF/cystic changes IT fovea).   Left Eye Quality was good. Central Foveal Thickness: 225. Progression has been stable. Findings include normal foveal contour, no SRF, no IRF (Stable improvement in cystic changes).   Notes *Images captured and stored on drive  Diagnosis / Impression:  OD: Mild interval increase in IRF/cystic changes IT fovea OS: NFP; +cystic changes / DME; no SRF -- Stable improvement in cystic changes  Clinical management:  See below  Abbreviations: NFP - Normal foveal profile. CME - cystoid macular edema. PED - pigment epithelial detachment. IRF - intraretinal fluid. SRF - subretinal fluid. EZ - ellipsoid zone. ERM -  epiretinal membrane. ORA - outer retinal atrophy. ORT - outer retinal tubulation. SRHM - subretinal hyper-reflective material        Focal Laser - OD - Right Eye       LASER PROCEDURE NOTE  Diagnosis:   Diabetic macular edema, right eye   Procedure:  Focal laser photocoagulation using slit  lamp laser, right eye   Anesthesia:  Topical  Surgeon: Bernarda Caffey, MD, PhD   Informed consent obtained, operative eye marked, and time out performed prior to initiation of laser.   Lumenis [id number] Focal/Grid laser Power: 75 mW Duration: 50 msec  Spot size: 100 microns  # spots: 85 spots placed to MAs temporal and inferotemporal to fovea and in superotemporal arcade.  Complications: None.  RTC: 4-6 wks  Patient tolerated the procedure well and received written and verbal post-procedure care information/education.                   ASSESSMENT/PLAN:    ICD-10-CM   1. 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 Retinal edema  H35.81 OCT, Retina - OU - Both Eyes  3. Essential hypertension  I10   4. Hypertensive retinopathy of both eyes  H35.033   5. Pseudophakia of both eyes  Z96.1   6. Choroidal nevus, right eye  D31.31     1,2. Moderate non-proliferative diabetic retinopathy, OU  - pt reports no subjective change in vision  - FA 04.30.21 shows late leaking MA OU, mild vasc perfusion defects OU, no NV  - today, BCVA 20/30 OD (decreased from 20/25) and 20/40 OS (stable)  - exam shows scattered MA/IRH OU (OD > OS) -- OD with perifoveal cluster of MA and edema -- slightly increased from prior  - OCT shows interval increase in IRF/DME OD, OS with stable improvement in IRF/cystic changes  - discussed findings and treatment options for DME  - recommend focal laser OD today, 10.05.21  - pt wishes to proceed  - RBA of procedure discussed, questions answered  - informed consent obtained and signed  - see procedure note  - start PF QID OD x7 days  - f/u 4-6 weeks, DFE, OCT  3,4. Hypertensive retinopathy OU  - discussed importance of tight BP control  - monitor  5. Pseudophakia OU  - s/p CE/IOL OU (Dr. Iona Hansen, 2006)  - IOLs in good position,  doing well  - monitor  6. Choroidal Nevus OD  - flat, irregular pigmented lesion at 1000 OD  - +overlying drusen; no SRF or orange pigment  - monitor   Ophthalmic Meds Ordered this visit:  Meds ordered this encounter  Medications  . prednisoLONE acetate (PRED FORTE) 1 % ophthalmic suspension    Sig: Place 1 drop into the right eye 4 (four) times daily for 7 days.    Dispense:  10 mL    Refill:  0       Return for f/u 4-6 weeks, NPDR OU, DFE, OCT.  There are no Patient Instructions on file for this visit.   Explained the diagnoses, plan, and follow up with the patient and they expressed understanding.  Patient expressed understanding of the importance of proper follow up care.   This document serves as a record of services personally performed by Gardiner Sleeper, MD, PhD. It was created on their behalf by Estill Bakes, COT an ophthalmic technician. The creation of  this record is the provider's dictation and/or activities during the visit.    Electronically signed by: Estill Bakes, COT 9.30.21 @ 11:12 AM   This document serves as a record of services personally performed by Gardiner Sleeper, MD, PhD. It was created on their behalf by San Jetty. Owens Shark, OA an ophthalmic technician. The creation of this record is the provider's dictation and/or activities during the visit.    Electronically signed by: San Jetty. Owens Shark, [place] 10.05.2021 11:12 AM  Gardiner Sleeper, M.D., Ph.D. Diseases & Surgery of the Retina and [place] 01/10/2020   I have reviewed the above documentation for accuracy and completeness, and I agree with the above. Gardiner Sleeper, M.D., Ph.D. 01/10/20 11:12 AM   Abbreviations: M myopia (nearsighted); A astigmatism; H hyperopia (farsighted); P presbyopia; Mrx spectacle prescription;  CTL contact lenses; OD right eye; OS left eye; OU both eyes  XT exotropia; ET esotropia; PEK punctate epithelial keratitis; PEE punctate epithelial  erosions; DES dry eye syndrome; MGD meibomian gland dysfunction; ATs artificial tears; PFAT's preservative free artificial tears; [place] nuclear sclerotic cataract; PSC posterior subcapsular cataract; ERM epi-retinal membrane; PVD posterior vitreous detachment; RD retinal detachment; DM diabetes mellitus; DR diabetic retinopathy; NPDR non-proliferative diabetic retinopathy; PDR proliferative diabetic retinopathy; CSME clinically significant macular edema; DME diabetic macular edema; dbh dot blot hemorrhages; CWS cotton wool spot; POAG primary open angle glaucoma; C/D cup-to-disc ratio; HVF humphrey visual field; GVF goldmann visual field; OCT optical coherence tomography; IOP intraocular pressure; BRVO Branch retinal vein occlusion; CRVO central retinal vein occlusion; CRAO central retinal artery occlusion; BRAO branch retinal artery occlusion; RT retinal tear; SB scleral buckle; PPV pars plana vitrectomy; VH Vitreous hemorrhage; PRP panretinal laser photocoagulation; IVK intravitreal kenalog; VMT vitreomacular traction; MH Macular hole;  NVD neovascularization of the disc; NVE neovascularization elsewhere; AREDS age related eye disease study; ARMD age related macular degeneration; POAG primary open angle glaucoma; EBMD epithelial/anterior basement membrane dystrophy; ACIOL anterior chamber intraocular lens; IOL intraocular lens; PCIOL posterior chamber intraocular lens; Phaco/IOL phacoemulsification with intraocular lens placement; [place] photorefractive keratectomy; LASIK laser assisted in situ keratomileusis; HTN hypertension; DM diabetes mellitus; COPD chronic obstructive pulmonary disease

## 2020-01-10 ENCOUNTER — Ambulatory Visit (INDEPENDENT_AMBULATORY_CARE_PROVIDER_SITE_OTHER): Payer: Medicare Other | Admitting: Ophthalmology

## 2020-01-10 ENCOUNTER — Encounter (INDEPENDENT_AMBULATORY_CARE_PROVIDER_SITE_OTHER): Payer: Self-pay | Admitting: Ophthalmology

## 2020-01-10 ENCOUNTER — Other Ambulatory Visit: Payer: Self-pay

## 2020-01-10 DIAGNOSIS — I10 Essential (primary) hypertension: Secondary | ICD-10-CM

## 2020-01-10 DIAGNOSIS — E113313 Type 2 diabetes mellitus with moderate nonproliferative diabetic retinopathy with macular edema, bilateral: Secondary | ICD-10-CM

## 2020-01-10 DIAGNOSIS — H3581 Retinal edema: Secondary | ICD-10-CM | POA: Diagnosis not present

## 2020-01-10 DIAGNOSIS — H35033 Hypertensive retinopathy, bilateral: Secondary | ICD-10-CM

## 2020-01-10 DIAGNOSIS — Z961 Presence of intraocular lens: Secondary | ICD-10-CM

## 2020-01-10 DIAGNOSIS — D3131 Benign neoplasm of right choroid: Secondary | ICD-10-CM

## 2020-01-10 MED ORDER — PREDNISOLONE ACETATE 1 % OP SUSP
1.0000 [drp] | Freq: Four times a day (QID) | OPHTHALMIC | 0 refills | Status: AC
Start: 2020-01-10 — End: 2020-01-17

## 2020-01-17 DIAGNOSIS — M810 Age-related osteoporosis without current pathological fracture: Secondary | ICD-10-CM | POA: Diagnosis not present

## 2020-01-31 ENCOUNTER — Ambulatory Visit: Payer: Medicare Other | Admitting: "Endocrinology

## 2020-01-31 ENCOUNTER — Other Ambulatory Visit: Payer: Self-pay

## 2020-01-31 ENCOUNTER — Other Ambulatory Visit (HOSPITAL_COMMUNITY)
Admission: RE | Admit: 2020-01-31 | Discharge: 2020-01-31 | Disposition: A | Discharge status: Home / Self Care | Payer: Medicare Other | Source: Ambulatory Visit | Attending: "Endocrinology | Admitting: "Endocrinology

## 2020-01-31 DIAGNOSIS — E039 Hypothyroidism, unspecified: Secondary | ICD-10-CM | POA: Diagnosis not present

## 2020-01-31 DIAGNOSIS — E049 Nontoxic goiter, unspecified: Secondary | ICD-10-CM | POA: Diagnosis not present

## 2020-01-31 LAB — T4, FREE: Free T4: 0.98 ng/dL (ref 0.61–1.12)

## 2020-01-31 LAB — TSH: TSH: 6.165 u[IU]/mL — ABNORMAL HIGH (ref 0.350–4.500)

## 2020-02-07 ENCOUNTER — Encounter: Payer: Self-pay | Admitting: "Endocrinology

## 2020-02-07 ENCOUNTER — Ambulatory Visit: Payer: Medicare Other | Admitting: "Endocrinology

## 2020-02-07 ENCOUNTER — Other Ambulatory Visit: Payer: Self-pay

## 2020-02-07 VITALS — BP 108/62 | HR 64 | Ht 61.0 in | Wt 181.8 lb

## 2020-02-07 DIAGNOSIS — E039 Hypothyroidism, unspecified: Secondary | ICD-10-CM

## 2020-02-07 DIAGNOSIS — E049 Nontoxic goiter, unspecified: Secondary | ICD-10-CM

## 2020-02-07 MED ORDER — LEVOTHYROXINE SODIUM 50 MCG PO TABS: 50.0000 ug | ORAL_TABLET | Freq: Every day | ORAL | 1 refills | Status: DC

## 2020-02-07 NOTE — Progress Notes (Signed)
02/07/2020, 12:55 PM  Endocrinology follow-up note   Subjective:    Patient ID: [name], female    DOB: 1930/08/23, PCP Neale Burly, MD   Past Medical History:  Diagnosis Date  . Atrial fibrillation [facility])    Maze procedure September, 2010, amiodarone and Coumadin stopped eventually January, 2011  . Coronary artery disease   . Ejection fraction    EF 60%, echo, September, 2010  . Endometrial ca [facility]) 09/25/2010   June, 2012, radiation and chemotherapy, Dr. Tressie Stalker  . Fluid overload   . Hiatal hernia   . HTN (hypertension)   . Hx of CABG    2010, Dr.Van Trigt, Maze procedure, ligation of left atrial appendage, Coumadin stopped January, 2011  . Hyperlipidemia   . Noninsulin dependent diabetes mellitus   . Osteoarthritis    Severe  . Renal insufficiency    Mild with higher dose diuretic  . Shortness of breath   . Statin intolerance    Is   Past Surgical History:  Procedure Laterality Date  . ABDOMINAL HYSTERECTOMY  07/2010   complete, endometrial cancer  . APPENDECTOMY  60 yrs ago  . BREAST MASS EXCISION     left breast 1985  . CARDIAC CATHETERIZATION    . CATARACT EXTRACTION Bilateral 2006   Dr. Iona Hansen  . choleycystectomy  1998  . COLONOSCOPY     several in [place], last one by Dr. Rowe Pavy, polyps once in [place]. records requested.  . COLONOSCOPY  06/2010   Dr. Rodrigo Ran good. Moderate diverticulosis in left colon. Inflammation found in descending colon, scope could not be advanced forward.  ACBE-->sigmoid diverticulosis no worrisome findings.   . CORONARY ARTERY BYPASS GRAFT  12/21/08   [place] Gum; LIMA LAD, SVG posterior descending, SVG OM, maze procedure, ligation left atrial appendage and MAZE procedure for A.fib  . ESOPHAGOGASTRODUODENOSCOPY  03/08/2012   GOT:[id number] ring was found at the gastroesophageal junction/Nodular gastritis (inflammation)/duodenal mucosa showed no abnormalities   .  PORT-A-CATH REMOVAL  01/23/2012   Procedure: REMOVAL PORT-A-CATH;  Surgeon: Jamesetta So, MD;  Location: [facility];  Service: General;  Laterality: N/A;  Minor Room  . PORTACATH PLACEMENT    . TONSILECTOMY, ADENOIDECTOMY, BILATERAL MYRINGOTOMY AND TUBES    . TUMOR REMOVAL     from throat  benign   Social History   Socioeconomic History  . Marital status: Widowed    Spouse name: Not on file  . Number of children: 1  . Years of education: Not on file  . Highest education level: Not on file  Occupational History  . Occupation: Retired    Fish farm manager: RETIRED  Tobacco Use  . Smoking status: Never Smoker  . Smokeless tobacco: Never Used  Substance and Sexual Activity  . Alcohol use: No    Alcohol/week: 0.0 standard drinks  . Drug use: No  . Sexual activity: Not on file  Other Topics Concern  . Not on file  Social History Narrative  . Not on file   Social Determinants of Health   Financial Resource Strain:   . Difficulty of Paying Living Expenses: Not on file  Food Insecurity:   . Worried About Charity fundraiser in the Last Year: Not on file  .  Ran Out of Food in the Last Year: Not on file  Transportation Needs:   . Lack of Transportation (Medical): Not on file  . Lack of Transportation (Non-Medical): Not on file  Physical Activity:   . Days of Exercise per Week: Not on file  . Minutes of Exercise per Session: Not on file  Stress:   . Feeling of Stress : Not on file  Social Connections:   . Frequency of Communication with Friends and Family: Not on file  . Frequency of Social Gatherings with Friends and Family: Not on file  . Attends Religious Services: Not on file  . Active Member of Clubs or Organizations: Not on file  . Attends Archivist Meetings: Not on file  . Marital Status: Not on file   Family History  Problem Relation Age of Onset  . Hypertension Mother   . Breast cancer Mother   . Heart attack Mother        deceased, age 88  . Cancer Mother    . Diabetes Sister        deceased - 59, renal failure,   . Heart attack Father   . Cancer Father   . Colon cancer Neg Hx   . Ovarian cancer Neg Hx   . Lung cancer Neg Hx   . Liver disease Neg Hx    Outpatient Encounter Medications as of 02/07/2020  Medication Sig  . alendronate (FOSAMAX) 70 MG tablet Take 70 mg by mouth once a week. Take with a full glass of water on an empty stomach.  . Calcium Carbonate (CALCIUM 500 PO) Take 500 mg by mouth 2 (two) times daily.  . Cholecalciferol (VITAMIN D PO) Take 400 Units by mouth daily.   Marland Kitchen denosumab (PROLIA) 60 MG/ML SOSY injection Inject 60 mg into the skin every 6 (six) months.  . diltiazem (CARDIZEM CD) 240 MG 24 hr capsule Take 240 mg by mouth daily.  . Diphenhydramine-APAP, sleep, (TYLENOL PM EXTRA STRENGTH PO) Take 1 tablet by mouth at bedtime as needed. Sleep  . furosemide (LASIX) 40 MG tablet Take 1 tablet (40 mg total) by mouth daily.  Marland Kitchen glipiZIDE (GLUCOTROL) 5 MG tablet Take 5 mg by mouth daily.   . Glucosamine 500 MG CAPS Take 500 mg by mouth 2 (two) times daily.    Marland Kitchen HYDROcodone-acetaminophen (NORCO/VICODIN) 5-325 MG per tablet Take 1 tablet by mouth every 8 (eight) hours as needed for moderate pain.  Marland Kitchen levothyroxine (SYNTHROID) 50 MCG tablet Take 1 tablet (50 mcg total) by mouth daily before breakfast.  . magnesium oxide (MAG-OX) 400 MG tablet Take 400 mg by mouth daily.  . metFORMIN (GLUCOPHAGE) 500 MG tablet Take 500 mg by mouth at bedtime. Once daily at bedtime  . metoprolol succinate (TOPROL-XL) 50 MG 24 hr tablet TAKE 1 AND 1/2 TABLETS BY MOUTH TWICE DAILY  . Multiple Vitamin (MULTIVITAMIN) tablet Take 1 tablet by mouth daily.  . Omega-3 Fatty Acids (FISH OIL) 1200 MG CAPS Take 1,200 mg by mouth 2 (two) times daily.  Marland Kitchen warfarin (COUMADIN) 5 MG tablet Take 5 mg by mouth daily. 7.5mg  alt w/ 5mg  every other day PER [name]'S OFFICE.  . [DISCONTINUED] levothyroxine (SYNTHROID) 25 MCG tablet Take 1 tablet (25 mcg total) by mouth  daily before breakfast.   No facility-administered encounter medications on file as of 02/07/2020.   ALLERGIES: Allergies  Allergen Reactions  . Statins Other (See Comments)    No energy, fatigue.     VACCINATION STATUS: Immunization  History  Administered Date(s) Administered  . Influenza-Unspecified 01/05/2018  . PFIZER SARS-COV-2 Vaccination 04/28/2019, 05/19/2019    HPI [name] is 86 y.o. female who presents today for follow-up after she was seen in consultation for hypothyroidism and nodular goiter. PMD: Neale Burly, MD.  -See previous visit notes. During her last encounter, she was diagnosed with hypothyroidism and was initiated on levothyroxine 25 mcg p.o. daily before breakfast. She is compliant and continues to take this medication. She has no new complaints today.   Recently she was admitted to [facility] for pneumonia.  CT scan of her chest incidentally revealed a 1.5 cm nodule on the left lobe of her thyroid. Her subsequent thyroid ultrasound on November 04, 2019 revealed 2.1 cm solitary inferior left lobe nodule with no suspicious features.  -Her previsit thyroid function tests are consistent with under replacement. -She denies dysphagia, shortness of breath, nor voice change.    She underwent controlled type 2 diabetes with A1c of 6.1%.  She is taking Metformin and normal glipizide.  She denies any family history of thyroid malignancy.  She denies any exposure to neck radiation.  Review of Systems Limited as above.  Objective:    Vitals with BMI 02/07/2020 10/25/2019 09/29/2019  Height 5\' 1"  5\' 1"  5\' 1"   Weight 181 lbs 13 oz 178 lbs 176 lbs 13 oz  BMI 34.37 32.99 24.26  Systolic 834 196 222  Diastolic 62 56 54  Pulse 64 56 48    BP 108/62   Pulse 64   Ht 5\' 1"  (1.549 m)   Wt 181 lb 12.8 oz (82.5 kg)   BMI 34.35 kg/m   Wt Readings from Last 3 Encounters:  02/07/20 181 lb 12.8 oz (82.5 kg)  10/25/19 178 lb (80.7 kg)  09/29/19 176 lb 12.8  oz (80.2 kg)    Physical Exam   CMP ( most recent) CMP     Component Value Date/Time   NA 134 (L) 08/27/2019 0613   K 3.4 (L) 08/27/2019 0613   CL 100 08/27/2019 0613   CO2 24 08/27/2019 0613   GLUCOSE 135 (H) 08/27/2019 0613   BUN 29 (H) 08/27/2019 0613   CREATININE 0.95 08/27/2019 0613   CALCIUM 8.1 (L) 08/27/2019 0613   PROT 6.6 08/25/2019 0449   ALBUMIN 3.3 (L) 08/25/2019 0449   AST 93 (H) 08/25/2019 0449   ALT 88 (H) 08/25/2019 0449   ALKPHOS 70 08/25/2019 0449   BILITOT 1.5 (H) 08/25/2019 0449   GFRNONAA 54 (L) 08/27/2019 0613   GFRAA >60 08/27/2019 9798     Diabetic Labs (most recent): Lab Results  Component Value Date   HGBA1C 6.1 (H) 08/24/2019   HGBA1C (H) 12/19/2008    7.5 (NOTE) The ADA recommends the following therapeutic goal for glycemic control related to Hgb A1c measurement: Goal of therapy: <6.5 Hgb A1c  Reference: American Diabetes Association: Clinical Practice Recommendations 2010, Diabetes Care, 2010, 33: (Suppl  1).   Recent Results (from the past 2160 hour(s))  TSH     Status: Abnormal   Collection Time: 01/31/20 11:17 AM  Result Value Ref Range   TSH 6.165 (H) 0.350 - 4.500 uIU/mL    Comment: Performed by a 3rd Generation assay with a functional sensitivity of <=0.01 uIU/mL. Performed at [facility], [address] [place], [place] [address]   T4, free     Status: None   Collection Time: 01/31/20 11:18 AM  Result Value Ref Range   Free T4  0.98 0.61 - 1.12 ng/dL    Comment: (NOTE) Biotin ingestion may interfere with free T4 tests. If the results are inconsistent with the TSH level, previous test results, or the clinical presentation, then consider biotin interference. If needed, order repeat testing after stopping biotin. Performed at [facility], [place] [address] [place], [place] [address]     Assessment & Plan:    1.  Hypothyroidism Her previsit thyroid function tests are consistent with under replacement. She would  benefit from higher dose of levothyroxine. I discussed and increase her levothyroxine to 50 mcg p.o. daily before breakfast.  - We discussed about the correct intake of her thyroid hormone, on empty stomach at fasting, with water, separated by at least 30 minutes from breakfast and other medications,  and separated by more than 4 hours from calcium, iron, multivitamins, acid reflux medications (PPIs). -Patient is made aware of the fact that thyroid hormone replacement is needed for life, dose to be adjusted by periodic monitoring of thyroid function tests.   2. Nodular goiter -Her July 2021 thyroid ultrasound revealed 2.1 cm left lobe nodule which was previously documented on a CT scan, with no suspicious features. In the setting of significant comorbidities or limited life expectancy, no follow-up was recommended.  She wishes to continue her follow-up with her PMD to minimize doctor visits and this is deemed appropriate. He is advised to obtain thyroid function test and visit in 6 months .   - she is advised to maintain close follow up with Neale Burly, MD for primary care needs.     - Time spent on this patient care encounter:  20 minutes of which 50% was spent in  counseling and the rest reviewing  her current and  previous labs / studies and medications  doses and developing a plan for long term care. [name]  participated in the discussions, expressed understanding, and voiced agreement with the above plans.  All questions were answered to her satisfaction. she is encouraged to contact clinic should she have any questions or concerns prior to her return visit.    Follow up plan: Return in about 6 months (around 08/06/2020), or she will follow up with Dr. Sherrie Sport in 6 months with labs.   Glade Lloyd, MD [facility] [address] [place], [place] [address] Phone: [phone]  Fax: [phone]     02/07/2020, 12:55  PM  This note was partially dictated with voice recognition software. Similar sounding words can be transcribed inadequately or may not  be corrected upon review.

## 2020-02-07 NOTE — Patient Instructions (Signed)
Do labs and see Dr. Sherrie Sport in 6 months for your thyroid.

## 2020-02-16 NOTE — Progress Notes (Signed)
[facility] Note  02/21/2020     CHIEF COMPLAINT Patient presents for Retina Follow Up   HISTORY OF PRESENT ILLNESS: [name] is a 86 y.o. female who presents to the clinic today for:   HPI    Retina Follow Up    I, the attending physician,  performed the HPI with the patient and updated documentation appropriately.          Comments    6 week follow up NPDR OU- Vision stable OU, no problems states patient.        Last edited by Bernarda Caffey, MD on 02/21/2020  1:25 PM. (History)    pt states no problems with laser procedure at last visit, she states she noticed an improvement in vision about a week afterwards  Referring physician: Leticia Clas, [facility] [address] [place],  [place] [address]  HISTORICAL INFORMATION:   Selected notes from the MEDICAL RECORD NUMBER Referred by Dr. Leticia Clas for concern of NPDR with mac edema OU LEE: 12.29.20 Leander Rams) [BCVA: OD: 20/30- OS: 20/40-] Ocular Hx-pseduo OU Iona Hansen, '06), PC fibrosis, PCO, PVD, asteroid OU, nevus OD, drusen OU, HTN ret OU    CURRENT MEDICATIONS: No current outpatient medications on file. (Ophthalmic Drugs)   No current facility-administered medications for this visit. (Ophthalmic Drugs)   Current Outpatient Medications (Other)  Medication Sig  . alendronate (FOSAMAX) 70 MG tablet Take 70 mg by mouth once a week. Take with a full glass of water on an empty stomach.  . Calcium Carbonate (CALCIUM 500 PO) Take 500 mg by mouth 2 (two) times daily.  . Cholecalciferol (VITAMIN D PO) Take 400 Units by mouth daily.   Marland Kitchen denosumab (PROLIA) 60 MG/ML SOSY injection Inject 60 mg into the skin every 6 (six) months.  . diltiazem (CARDIZEM CD) 240 MG 24 hr capsule Take 240 mg by mouth daily.  . Diphenhydramine-APAP, sleep, (TYLENOL PM EXTRA STRENGTH PO) Take 1 tablet by mouth at bedtime as needed. Sleep  . furosemide (LASIX) 40 MG tablet Take 1 tablet (40 mg total) by mouth  daily.  Marland Kitchen glipiZIDE (GLUCOTROL) 5 MG tablet Take 5 mg by mouth daily.   . Glucosamine 500 MG CAPS Take 500 mg by mouth 2 (two) times daily.    Marland Kitchen HYDROcodone-acetaminophen (NORCO/VICODIN) 5-325 MG per tablet Take 1 tablet by mouth every 8 (eight) hours as needed for moderate pain.  Marland Kitchen levothyroxine (SYNTHROID) 50 MCG tablet Take 1 tablet (50 mcg total) by mouth daily before breakfast.  . magnesium oxide (MAG-OX) 400 MG tablet Take 400 mg by mouth daily.  . metFORMIN (GLUCOPHAGE) 500 MG tablet Take 500 mg by mouth at bedtime. Once daily at bedtime  . metoprolol succinate (TOPROL-XL) 50 MG 24 hr tablet TAKE 1 AND 1/2 TABLETS BY MOUTH TWICE DAILY  . Multiple Vitamin (MULTIVITAMIN) tablet Take 1 tablet by mouth daily.  . Omega-3 Fatty Acids (FISH OIL) 1200 MG CAPS Take 1,200 mg by mouth 2 (two) times daily.  Marland Kitchen warfarin (COUMADIN) 5 MG tablet Take 5 mg by mouth daily. 7.106m alt w/ 556mevery other day PER [name]'S OFFICE.   No current facility-administered medications for this visit. (Other)      REVIEW OF SYSTEMS: ROS    Positive for: Gastrointestinal, Genitourinary, Musculoskeletal, Endocrine, Cardiovascular, Eyes   Negative for: Constitutional, Neurological, Skin, HENT, Respiratory, Psychiatric, Allergic/Imm, Heme/Lymph   Last edited by HoLeonie DouglasCOA on 02/21/2020  9:49 AM. (History)  ALLERGIES Allergies  Allergen Reactions  . Statins Other (See Comments)    No energy, fatigue.     PAST MEDICAL HISTORY Past Medical History:  Diagnosis Date  . Atrial fibrillation [facility])    Maze procedure September, 2010, amiodarone and Coumadin stopped eventually January, 2011  . Coronary artery disease   . Ejection fraction    EF 60%, echo, September, 2010  . Endometrial ca [facility]) 09/25/2010   June, 2012, radiation and chemotherapy, Dr. Tressie Stalker  . Fluid overload   . Hiatal hernia   . HTN (hypertension)   . Hx of CABG    2010, Dr.Van Trigt, Maze procedure, ligation of left atrial  appendage, Coumadin stopped January, 2011  . Hyperlipidemia   . Noninsulin dependent diabetes mellitus   . Osteoarthritis    Severe  . Renal insufficiency    Mild with higher dose diuretic  . Shortness of breath   . Statin intolerance    Is   Past Surgical History:  Procedure Laterality Date  . ABDOMINAL HYSTERECTOMY  07/2010   complete, endometrial cancer  . APPENDECTOMY  60 yrs ago  . BREAST MASS EXCISION     left breast 1985  . CARDIAC CATHETERIZATION    . CATARACT EXTRACTION Bilateral 2006   Dr. Iona Hansen  . choleycystectomy  1998  . COLONOSCOPY     several in [place], last one by Dr. Rowe Pavy, polyps once in [place]. records requested.  . COLONOSCOPY  06/2010   Dr. Rodrigo Ran good. Moderate diverticulosis in left colon. Inflammation found in descending colon, scope could not be advanced forward.  ACBE-->sigmoid diverticulosis no worrisome findings.   . CORONARY ARTERY BYPASS GRAFT  12/21/08   [place] Gum; LIMA LAD, SVG posterior descending, SVG OM, maze procedure, ligation left atrial appendage and MAZE procedure for A.fib  . ESOPHAGOGASTRODUODENOSCOPY  03/08/2012   HFS:FSELTRVU ring was found at the gastroesophageal junction/Nodular gastritis (inflammation)/duodenal mucosa showed no abnormalities   . PORT-A-CATH REMOVAL  01/23/2012   Procedure: REMOVAL PORT-A-CATH;  Surgeon: Jamesetta So, MD;  Location: [facility];  Service: General;  Laterality: N/A;  Minor Room  . PORTACATH PLACEMENT    . TONSILECTOMY, ADENOIDECTOMY, BILATERAL MYRINGOTOMY AND TUBES    . TUMOR REMOVAL     from throat  benign    FAMILY HISTORY Family History  Problem Relation Age of Onset  . Hypertension Mother   . Breast cancer Mother   . Heart attack Mother        deceased, age 45  . Cancer Mother   . Diabetes Sister        deceased - 12, renal failure,   . Heart attack Father   . Cancer Father   . Colon cancer Neg Hx   . Ovarian cancer Neg Hx   . Lung cancer Neg Hx   . Liver disease Neg Hx      SOCIAL HISTORY Social History   Tobacco Use  . Smoking status: Never Smoker  . Smokeless tobacco: Never Used  Substance Use Topics  . Alcohol use: No    Alcohol/week: 0.0 standard drinks  . Drug use: No         OPHTHALMIC EXAM:  Base Eye Exam    Visual Acuity (Snellen - Linear)      Right Left   Dist cc 20/40 20/50 -2   Dist ph cc 20/25 +1 20/40 +2   Correction: Glasses       Tonometry (Tonopen, 9:56 AM)      Right Left  Pressure 12 14       Pupils      Dark Light Shape React APD   Right 3 2 Round Brisk None   Left 3 2 Round Brisk None       Visual Fields (Counting fingers)      Left Right    Full Full       Extraocular Movement      Right Left    Full Full       Neuro/Psych    Oriented x3: Yes   Mood/Affect: Normal       Dilation    Both eyes: 1.0% Mydriacyl, 2.5% Phenylephrine @ 9:56 AM        Slit Lamp and Fundus Exam    Slit Lamp Exam      Right Left   Lids/Lashes Dermatochalasis - upper lid Dermatochalasis - upper lid   Conjunctiva/Sclera White and quiet White and quiet   Cornea Trace Punctate epithelial erosions, mild EBMD, mild Debris in tear film 1+ Punctate epithelial erosions, mild EBMD, mild Debris in tear film   Anterior Chamber Deep and quiet Deep and quiet   Iris Round and dilated, No NVI Round and dilated, No NVI   Lens PC IOL in good position, 2+ Posterior capsular opacification approaching center PC IOL in good position, mild anterior capsular phimosis, trace, non-central Posterior capsular opacification   Vitreous Vitreous syneresis, Posterior vitreous detachment Vitreous syneresis, Posterior vitreous detachment       Fundus Exam      Right Left   Disc Tilted disc, mild temporal Pallor, temporal PPA, Sharp rim Compact, mild Pallor, mildly Tilted disc, mild PPA   C/D Ratio 0.3 0.3   Macula Flat, Blunted foveal reflex, cluster of Microaneurysms IT and temporal to fovea--persistent, +cystic changes - improved, mild focal  laser changes IT Flat, Blunted foveal reflex, mild RPE mottling and clumping, scattered Microaneurysms, mild cluster inferior to fovea--improving, no edema   Vessels Attenuated, dilated and tortuous venules attenuated, Tortuous   Periphery Attached, scattered MA/DBH and peripheral drusen, ?irregular nevus at 1000 w/overlying drusen--stable Attached, rare MA/DBH, scattered peripheral drusen          IMAGING AND PROCEDURES  Imaging and Procedures for _0 @  OCT, Retina - OU - Both Eyes       Right Eye Quality was good. Central Foveal Thickness: 258. Progression has improved. Findings include abnormal foveal contour, intraretinal fluid, no SRF (Interval improvement in temporal IRF; focal ORA IT to fovea -- focal laser changes).   Left Eye Quality was good. Central Foveal Thickness: 227. Progression has been stable. Findings include normal foveal contour, no SRF, no IRF (Stable improvement in cystic changes).   Notes *Images captured and stored on drive  Diagnosis / Impression:  OD: Interval improvement in temporal IRF; focal ORA IT to fovea -- focal laser changes OS: NFP; +cystic changes / DME; no SRF -- Stable improvement in cystic changes  Clinical management:  See below  Abbreviations: NFP - Normal foveal profile. CME - cystoid macular edema. PED - pigment epithelial detachment. IRF - intraretinal fluid. SRF - subretinal fluid. EZ - ellipsoid zone. ERM - epiretinal membrane. ORA - outer retinal atrophy. ORT - outer retinal tubulation. SRHM - subretinal hyper-reflective material                 ASSESSMENT/PLAN:    ICD-10-CM   1. Moderate nonproliferative diabetic retinopathy of both eyes with macular edema associated with type 2 diabetes mellitus ([place])  I33.8250  2. Retinal edema  H35.81 OCT, Retina - OU - Both Eyes  3. Essential hypertension  I10   4. Hypertensive retinopathy of both eyes  H35.033   5. Pseudophakia of both eyes  Z96.1   6. Choroidal nevus, right  eye  D31.31     1,2. Moderate non-proliferative diabetic retinopathy, OU  - s/p focal laser OD (10.05.21)  - pt reports subjective improvement in vision OD  - FA 04.30.21 shows late leaking MA OU, mild vasc perfusion defects OU, no NV  - today, BCVA 20/25 OD (improved from 20/30) and 20/40 OS (stable)  - exam shows scattered MA/IRH OU (OD > OS) -- OD with perifoveal cluster of MA and edema -- improving  - OCT shows OD: Interval improvement in temporal IRF; focal ORA IT to fovea -- focal laser changes; OS: Stable improvement in cystic changes  - no intervention recommended today  - monitor  - f/u 3 months, DFE, OCT  3,4. Hypertensive retinopathy OU  - discussed importance of tight BP control  - monitor  5. Pseudophakia OU  - s/p CE/IOL OU (Dr. Iona Hansen, 2006)  - IOLs in good position, doing well  - monitor  6. Choroidal Nevus OD  - flat, irregular pigmented lesion at 1000 OD  - +overlying drusen; no SRF or orange pigment  - monitor  Ophthalmic Meds Ordered this visit:  No orders of the defined types were placed in this encounter.      Return in about 3 months (around 05/23/2020) for f/u 3 months NPDR OU, DFE, OCT.  There are no Patient Instructions on file for this visit.   Explained the diagnoses, plan, and follow up with the patient and they expressed understanding.  Patient expressed understanding of the importance of proper follow up care.   This document serves as a record of services personally performed by Gardiner Sleeper, MD, PhD. It was created on their behalf by Estill Bakes, COT an ophthalmic technician. The creation of this record is the provider's dictation and/or activities during the visit.    Electronically signed by: Estill Bakes, COT 11.11.21 @ 1:27 PM  Gardiner Sleeper, M.D., Ph.D. Diseases & Surgery of the Retina and [place] 02/21/2020   I have reviewed the above documentation for accuracy and completeness, and I  agree with the above. Gardiner Sleeper, M.D., Ph.D. 02/21/20 1:29 PM  Abbreviations: M myopia (nearsighted); A astigmatism; H hyperopia (farsighted); P presbyopia; Mrx spectacle prescription;  CTL contact lenses; OD right eye; OS left eye; OU both eyes  XT exotropia; ET esotropia; PEK punctate epithelial keratitis; PEE punctate epithelial erosions; DES dry eye syndrome; MGD meibomian gland dysfunction; ATs artificial tears; PFAT's preservative free artificial tears; [place] nuclear sclerotic cataract; PSC posterior subcapsular cataract; ERM epi-retinal membrane; PVD posterior vitreous detachment; RD retinal detachment; DM diabetes mellitus; DR diabetic retinopathy; NPDR non-proliferative diabetic retinopathy; PDR proliferative diabetic retinopathy; CSME clinically significant macular edema; DME diabetic macular edema; dbh dot blot hemorrhages; CWS cotton wool spot; POAG primary open angle glaucoma; C/D cup-to-disc ratio; HVF humphrey visual field; GVF goldmann visual field; OCT optical coherence tomography; IOP intraocular pressure; BRVO Branch retinal vein occlusion; CRVO central retinal vein occlusion; CRAO central retinal artery occlusion; BRAO branch retinal artery occlusion; RT retinal tear; SB scleral buckle; PPV pars plana vitrectomy; VH Vitreous hemorrhage; PRP panretinal laser photocoagulation; IVK intravitreal kenalog; VMT vitreomacular traction; MH Macular hole;  NVD neovascularization of the disc; NVE neovascularization elsewhere; AREDS age related eye  disease study; ARMD age related macular degeneration; POAG primary open angle glaucoma; EBMD epithelial/anterior basement membrane dystrophy; ACIOL anterior chamber intraocular lens; IOL intraocular lens; PCIOL posterior chamber intraocular lens; Phaco/IOL phacoemulsification with intraocular lens placement; [place] photorefractive keratectomy; LASIK laser assisted in situ keratomileusis; HTN hypertension; DM diabetes mellitus; COPD chronic obstructive pulmonary  disease

## 2020-02-21 ENCOUNTER — Other Ambulatory Visit: Payer: Self-pay

## 2020-02-21 ENCOUNTER — Encounter (INDEPENDENT_AMBULATORY_CARE_PROVIDER_SITE_OTHER): Payer: Self-pay | Admitting: Ophthalmology

## 2020-02-21 ENCOUNTER — Ambulatory Visit (INDEPENDENT_AMBULATORY_CARE_PROVIDER_SITE_OTHER): Payer: Medicare Other | Admitting: Ophthalmology

## 2020-02-21 DIAGNOSIS — H3581 Retinal edema: Secondary | ICD-10-CM

## 2020-02-21 DIAGNOSIS — Z961 Presence of intraocular lens: Secondary | ICD-10-CM

## 2020-02-21 DIAGNOSIS — D3131 Benign neoplasm of right choroid: Secondary | ICD-10-CM

## 2020-02-21 DIAGNOSIS — E113313 Type 2 diabetes mellitus with moderate nonproliferative diabetic retinopathy with macular edema, bilateral: Secondary | ICD-10-CM

## 2020-02-21 DIAGNOSIS — I10 Essential (primary) hypertension: Secondary | ICD-10-CM

## 2020-02-21 DIAGNOSIS — H35033 Hypertensive retinopathy, bilateral: Secondary | ICD-10-CM

## 2020-02-23 ENCOUNTER — Other Ambulatory Visit: Payer: Self-pay | Admitting: "Endocrinology

## 2020-03-02 DIAGNOSIS — Z7901 Long term (current) use of anticoagulants: Secondary | ICD-10-CM | POA: Diagnosis not present

## 2020-04-03 DIAGNOSIS — I5022 Chronic systolic (congestive) heart failure: Secondary | ICD-10-CM | POA: Diagnosis not present

## 2020-04-03 DIAGNOSIS — I10 Essential (primary) hypertension: Secondary | ICD-10-CM | POA: Diagnosis not present

## 2020-04-03 DIAGNOSIS — E782 Mixed hyperlipidemia: Secondary | ICD-10-CM | POA: Diagnosis not present

## 2020-04-03 DIAGNOSIS — E1121 Type 2 diabetes mellitus with diabetic nephropathy: Secondary | ICD-10-CM | POA: Diagnosis not present

## 2020-04-25 ENCOUNTER — Other Ambulatory Visit: Payer: Self-pay

## 2020-05-03 DIAGNOSIS — I480 Paroxysmal atrial fibrillation: Secondary | ICD-10-CM | POA: Diagnosis not present

## 2020-05-03 DIAGNOSIS — S3282XA Multiple fractures of pelvis without disruption of pelvic ring, initial encounter for closed fracture: Secondary | ICD-10-CM | POA: Diagnosis not present

## 2020-05-14 NOTE — Progress Notes (Signed)
[facility] Note  05/22/2020     CHIEF COMPLAINT Patient presents for Retina Follow Up   HISTORY OF PRESENT ILLNESS: [name] is a 87 y.o. female who presents to the clinic today for:   Pt states vision is stable  Referring physician: Neale Burly, MD [place],  [place] [address]  HISTORICAL INFORMATION:   Selected notes from the MEDICAL RECORD NUMBER Referred by Dr. Leticia Clas for concern of NPDR with mac edema OU LEE: 12.29.20 Leander Rams) [BCVA: OD: 20/30- OS: 20/40-] Ocular Hx-pseduo OU Iona Hansen, '06), PC fibrosis, PCO, PVD, asteroid OU, nevus OD, drusen OU, HTN ret OU   CURRENT MEDICATIONS: No current outpatient medications on file. (Ophthalmic Drugs)   No current facility-administered medications for this visit. (Ophthalmic Drugs)   Current Outpatient Medications (Other)  Medication Sig  . alendronate (FOSAMAX) 70 MG tablet Take 70 mg by mouth once a week. Take with a full glass of water on an empty stomach.  . Calcium Carbonate (CALCIUM 500 PO) Take 500 mg by mouth 2 (two) times daily.  . Cholecalciferol (VITAMIN D PO) Take 400 Units by mouth daily.   Marland Kitchen denosumab (PROLIA) 60 MG/ML SOSY injection Inject 60 mg into the skin every 6 (six) months.  . diltiazem (CARDIZEM CD) 240 MG 24 hr capsule Take 240 mg by mouth daily.  . Diphenhydramine-APAP, sleep, (TYLENOL PM EXTRA STRENGTH PO) Take 1 tablet by mouth at bedtime as needed. Sleep  . furosemide (LASIX) 40 MG tablet Take 1 tablet (40 mg total) by mouth daily.  Marland Kitchen glipiZIDE (GLUCOTROL) 5 MG tablet Take 5 mg by mouth daily.   . Glucosamine 500 MG CAPS Take 500 mg by mouth 2 (two) times daily.    Marland Kitchen HYDROcodone-acetaminophen (NORCO/VICODIN) 5-325 MG per tablet Take 1 tablet by mouth every 8 (eight) hours as needed for moderate pain.  Marland Kitchen levothyroxine (SYNTHROID) 50 MCG tablet Take 1 tablet (50 mcg total) by mouth daily before breakfast.  . magnesium oxide (MAG-OX) 400 MG  tablet Take 400 mg by mouth daily.  . metFORMIN (GLUCOPHAGE) 500 MG tablet Take 500 mg by mouth at bedtime. Once daily at bedtime  . metoprolol succinate (TOPROL-XL) 50 MG 24 hr tablet TAKE 1 AND 1/2 TABLETS BY MOUTH TWICE DAILY  . Multiple Vitamin (MULTIVITAMIN) tablet Take 1 tablet by mouth daily.  . Omega-3 Fatty Acids (FISH OIL) 1200 MG CAPS Take 1,200 mg by mouth 2 (two) times daily.  Marland Kitchen warfarin (COUMADIN) 5 MG tablet Take 5 mg by mouth daily. 7.66m alt w/ 537mevery other day PER [name]'S OFFICE.   No current facility-administered medications for this visit. (Other)      REVIEW OF SYSTEMS: ROS    Positive for: Gastrointestinal, Genitourinary, Musculoskeletal, Endocrine, Cardiovascular, Eyes   Negative for: Constitutional, Neurological, Skin, HENT, Respiratory, Psychiatric, Allergic/Imm, Heme/Lymph   Last edited by EnDoneen Poissonn 05/22/2020  9:49 AM. (History)       ALLERGIES Allergies  Allergen Reactions  . Statins Other (See Comments)    No energy, fatigue.     PAST MEDICAL HISTORY Past Medical History:  Diagnosis Date  . [facility]   Maze procedure September, 2010, amiodarone and Coumadin stopped eventually January, 2011  . Coronary artery disease   . Ejection fraction    EF 60%, echo, September, 2010  . [facility]   June, 2012, radiation and chemotherapy, Dr. NeTressie Stalker. Fluid overload   .  Hiatal hernia   . HTN (hypertension)   . Hx of CABG    2010, Dr.Van Trigt, Maze procedure, ligation of left atrial appendage, Coumadin stopped January, 2011  . Hyperlipidemia   . Noninsulin dependent diabetes mellitus   . Osteoarthritis    Severe  . Renal insufficiency    Mild with higher dose diuretic  . Shortness of breath   . Statin intolerance    Is   Past Surgical History:  Procedure Laterality Date  . ABDOMINAL HYSTERECTOMY  07/2010   complete, endometrial cancer  . APPENDECTOMY  60 yrs ago  . BREAST MASS EXCISION      left breast 1985  . CARDIAC CATHETERIZATION    . CATARACT EXTRACTION Bilateral 2006   Dr. Iona Hansen  . choleycystectomy  1998  . COLONOSCOPY     several in [place], last one by Dr. Rowe Pavy, polyps once in [place]. records requested.  . COLONOSCOPY  06/2010   Dr. Rodrigo Ran good. Moderate diverticulosis in left colon. Inflammation found in descending colon, scope could not be advanced forward.  ACBE-->sigmoid diverticulosis no worrisome findings.   . CORONARY ARTERY BYPASS GRAFT  12/21/08   Prescott Gum; LIMA LAD, SVG posterior descending, SVG OM, maze procedure, ligation left atrial appendage and MAZE procedure for A.fib  . ESOPHAGOGASTRODUODENOSCOPY  03/08/2012   [id number] ring was found at the gastroesophageal junction/Nodular gastritis (inflammation)/duodenal mucosa showed no abnormalities   . PORT-A-CATH REMOVAL  01/23/2012   Procedure: REMOVAL PORT-A-CATH;  Surgeon: Jamesetta So, MD;  Location: [facility];  Service: General;  Laterality: N/A;  Minor Room  . PORTACATH PLACEMENT    . TONSILECTOMY, ADENOIDECTOMY, BILATERAL MYRINGOTOMY AND TUBES    . TUMOR REMOVAL     from throat  benign    FAMILY HISTORY Family History  Problem Relation Age of Onset  . Hypertension Mother   . Breast cancer Mother   . Heart attack Mother        deceased, age 66  . Cancer Mother   . Diabetes Sister        deceased - 40, renal failure,   . Heart attack Father   . Cancer Father   . Colon cancer Neg Hx   . Ovarian cancer Neg Hx   . Lung cancer Neg Hx   . Liver disease Neg Hx     SOCIAL HISTORY Social History   Tobacco Use  . Smoking status: Never Smoker  . Smokeless tobacco: Never Used  Substance Use Topics  . Alcohol use: No    Alcohol/week: 0.0 standard drinks  . Drug use: No         OPHTHALMIC EXAM:  Base Eye Exam    Visual Acuity (Snellen - Linear)      Right Left   Dist cc 20/40 -1 20/70 -2   Dist ph cc 20/25 20/30 -2   Correction: Glasses       Tonometry (Tonopen, 9:53 AM)       Right Left   Pressure 13 13       Pupils      Dark Light Shape React APD   Right 3 2 Round Brisk 0   Left 3 2 Round Brisk 0       Visual Fields      Left Right    Full Full       Extraocular Movement      Right Left    Full Full       Neuro/Psych    Oriented x3:  Yes   Mood/Affect: Normal       Dilation    Both eyes: 1.0% Mydriacyl, 2.5% Phenylephrine @ 9:53 AM        Slit Lamp and Fundus Exam    Slit Lamp Exam      Right Left   Lids/Lashes Dermatochalasis - upper lid Dermatochalasis - upper lid   Conjunctiva/Sclera White and quiet White and quiet   Cornea Trace Punctate epithelial erosions, mild EBMD, mild Debris in tear film 1+ Punctate epithelial erosions, mild EBMD, mild Debris in tear film   Anterior Chamber Deep and quiet Deep and quiet   Iris Round and dilated, No NVI Round and dilated, No NVI   Lens PC IOL in good position, 2+ Posterior capsular opacification approaching center PC IOL in good position, mild anterior capsular phimosis, trace, non-central Posterior capsular opacification   Vitreous Vitreous syneresis, Posterior vitreous detachment, vitreous condensations Vitreous syneresis, Posterior vitreous detachment       Fundus Exam      Right Left   Disc Tilted disc, mild temporal Pallor, temporal PPA, Sharp rim Compact, mild Pallor, mildly Tilted disc, mild PPA   C/D Ratio 0.3 0.3   Macula Flat, Blunted foveal reflex, cluster of Microaneurysms IT and temporal to fovea -- improved, +cystic changes - improved, mild focal laser changes IT Flat, Blunted foveal reflex, mild RPE mottling and clumping, scattered Microaneurysms, mild cluster inferior to fovea -- improving, no edema   Vessels attenuated, mild tortuousity attenuated, mild tortuousity   Periphery Attached, scattered MA/DBH and peripheral drusen, ?irregular nevus at 1000 w/overlying drusen -- stable Attached, rare MA/DBH greatest temporal periphery, scattered peripheral drusen         Refraction    Wearing Rx      Sphere Cylinder Axis   Right -1.50 +0.75 023   Left [place] +0.50 105          IMAGING AND PROCEDURES  Imaging and Procedures for _0 @  OCT, Retina - OU - Both Eyes       Right Eye Quality was good. Central Foveal Thickness: 241. Progression has improved. Findings include abnormal foveal contour, intraretinal fluid, no SRF (Interval improvement in central cystic changes / IRF; focal ORA IT to fovea -- focal laser changes).   Left Eye Quality was good. Central Foveal Thickness: 230. Progression has been stable. Findings include normal foveal contour, no SRF, no IRF (Stable improvement in cystic changes).   Notes *Images captured and stored on drive  Diagnosis / Impression:  OD: Interval improvement in central cystic changes / IRF; focal ORA IT to fovea -- focal laser changes OS: NFP; +cystic changes / DME; no SRF -- Stable improvement in cystic changes  Clinical management:  See below  Abbreviations: NFP - Normal foveal profile. CME - cystoid macular edema. PED - pigment epithelial detachment. IRF - intraretinal fluid. SRF - subretinal fluid. EZ - ellipsoid zone. ERM - epiretinal membrane. ORA - outer retinal atrophy. ORT - outer retinal tubulation. SRHM - subretinal hyper-reflective material                 ASSESSMENT/PLAN:    ICD-10-CM   1. Moderate nonproliferative diabetic retinopathy of both eyes with macular edema associated with type 2 diabetes mellitus ([place])  V78.5885   2. Retinal edema  H35.81 OCT, Retina - OU - Both Eyes  3. Essential hypertension  I10   4. Hypertensive retinopathy of both eyes  H35.033   5. Pseudophakia of both eyes  Z96.1   6.  Choroidal nevus, right eye  D31.31     1,2. Moderate non-proliferative diabetic retinopathy, OU  - s/p focal laser OD (10.05.21)  - pt reports subjective improvement in vision OD  - FA 04.30.21 shows late leaking MA OU, mild vasc perfusion defects OU, no NV  - today, BCVA  20/25 OD (stable) and 20/30 OS (improved)  - exam shows scattered MA/IRH OU (OD > OS) -- OD with perifoveal cluster of MA and edema -- improving  - OCT shows OD: Interval improvement in central cystic changes / IRF; focal ORA IT to fovea -- focal laser changes; OS: Stable improvement in cystic changes  - no intervention recommended today  - monitor  - f/u 4 months, DFE, OCT  3,4. Hypertensive retinopathy OU  - discussed importance of tight BP control  - monitor  5. Pseudophakia OU  - s/p CE/IOL OU (Dr. Iona Hansen, 2006)  - IOLs in good position, doing well  - monitor  6. Choroidal Nevus OD  - flat, irregular pigmented lesion at 1000 OD  - +overlying drusen; no SRF or orange pigment  - monitor  Ophthalmic Meds Ordered this visit:  No orders of the defined types were placed in this encounter.      Return in about 4 months (around 09/19/2020) for f/u NPDR OU, DFE, OCT.  There are no Patient Instructions on file for this visit.   Explained the diagnoses, plan, and follow up with the patient and they expressed understanding.  Patient expressed understanding of the importance of proper follow up care.   This document serves as a record of services personally performed by Gardiner Sleeper, MD, PhD. It was created on their behalf by Estill Bakes, COT an ophthalmic technician. The creation of this record is the provider's dictation and/or activities during the visit.    Electronically signed by: Estill Bakes, COT 2.7.22 @ 12:44 PM   This document serves as a record of services personally performed by Gardiner Sleeper, MD, PhD. It was created on their behalf by San Jetty. Owens Shark, OA an ophthalmic technician. The creation of this record is the provider's dictation and/or activities during the visit.    Electronically signed by: San Jetty. Marguerita Merles 02.15.2022 12:44 PM  Gardiner Sleeper, M.D., Ph.D. Diseases & Surgery of the Retina and [place] 05/22/2020    I have reviewed the above documentation for accuracy and completeness, and I agree with the above. Gardiner Sleeper, M.D., Ph.D. 05/22/20 12:44 PM   Abbreviations: M myopia (nearsighted); A astigmatism; H hyperopia (farsighted); P presbyopia; Mrx spectacle prescription;  CTL contact lenses; OD right eye; OS left eye; OU both eyes  XT exotropia; ET esotropia; PEK punctate epithelial keratitis; PEE punctate epithelial erosions; DES dry eye syndrome; MGD meibomian gland dysfunction; ATs artificial tears; PFAT's preservative free artificial tears; [place] nuclear sclerotic cataract; PSC posterior subcapsular cataract; ERM epi-retinal membrane; PVD posterior vitreous detachment; RD retinal detachment; DM diabetes mellitus; DR diabetic retinopathy; NPDR non-proliferative diabetic retinopathy; PDR proliferative diabetic retinopathy; CSME clinically significant macular edema; DME diabetic macular edema; dbh dot blot hemorrhages; CWS cotton wool spot; POAG primary open angle glaucoma; C/D cup-to-disc ratio; HVF humphrey visual field; GVF goldmann visual field; OCT optical coherence tomography; IOP intraocular pressure; BRVO Branch retinal vein occlusion; CRVO central retinal vein occlusion; CRAO central retinal artery occlusion; BRAO branch retinal artery occlusion; RT retinal tear; SB scleral buckle; PPV pars plana vitrectomy; VH Vitreous hemorrhage; PRP panretinal laser photocoagulation; IVK intravitreal kenalog;  VMT vitreomacular traction; MH Macular hole;  NVD neovascularization of the disc; NVE neovascularization elsewhere; AREDS age related eye disease study; ARMD age related macular degeneration; POAG primary open angle glaucoma; EBMD epithelial/anterior basement membrane dystrophy; ACIOL anterior chamber intraocular lens; IOL intraocular lens; PCIOL posterior chamber intraocular lens; Phaco/IOL phacoemulsification with intraocular lens placement; Roberta photorefractive keratectomy; LASIK laser assisted in situ  keratomileusis; HTN hypertension; DM diabetes mellitus; COPD chronic obstructive pulmonary disease

## 2020-05-22 ENCOUNTER — Encounter (INDEPENDENT_AMBULATORY_CARE_PROVIDER_SITE_OTHER): Payer: Self-pay | Admitting: Ophthalmology

## 2020-05-22 ENCOUNTER — Other Ambulatory Visit: Payer: Self-pay

## 2020-05-22 ENCOUNTER — Ambulatory Visit (INDEPENDENT_AMBULATORY_CARE_PROVIDER_SITE_OTHER): Payer: Medicare Other | Admitting: Ophthalmology

## 2020-05-22 DIAGNOSIS — H3581 Retinal edema: Secondary | ICD-10-CM

## 2020-05-22 DIAGNOSIS — D3131 Benign neoplasm of right choroid: Secondary | ICD-10-CM

## 2020-05-22 DIAGNOSIS — E113313 Type 2 diabetes mellitus with moderate nonproliferative diabetic retinopathy with macular edema, bilateral: Secondary | ICD-10-CM

## 2020-05-22 DIAGNOSIS — I10 Essential (primary) hypertension: Secondary | ICD-10-CM | POA: Diagnosis not present

## 2020-05-22 DIAGNOSIS — H35033 Hypertensive retinopathy, bilateral: Secondary | ICD-10-CM | POA: Diagnosis not present

## 2020-05-22 DIAGNOSIS — Z961 Presence of intraocular lens: Secondary | ICD-10-CM

## 2020-06-04 DIAGNOSIS — E1121 Type 2 diabetes mellitus with diabetic nephropathy: Secondary | ICD-10-CM | POA: Diagnosis not present

## 2020-06-04 DIAGNOSIS — I5022 Chronic systolic (congestive) heart failure: Secondary | ICD-10-CM | POA: Diagnosis not present

## 2020-06-04 DIAGNOSIS — E782 Mixed hyperlipidemia: Secondary | ICD-10-CM | POA: Diagnosis not present

## 2020-06-04 DIAGNOSIS — I10 Essential (primary) hypertension: Secondary | ICD-10-CM | POA: Diagnosis not present

## 2020-06-05 ENCOUNTER — Ambulatory Visit: Payer: Medicare Other | Admitting: Cardiology

## 2020-06-21 DIAGNOSIS — I480 Paroxysmal atrial fibrillation: Secondary | ICD-10-CM | POA: Diagnosis not present

## 2020-07-03 DIAGNOSIS — E782 Mixed hyperlipidemia: Secondary | ICD-10-CM | POA: Diagnosis not present

## 2020-07-03 DIAGNOSIS — E1121 Type 2 diabetes mellitus with diabetic nephropathy: Secondary | ICD-10-CM | POA: Diagnosis not present

## 2020-07-03 DIAGNOSIS — I5022 Chronic systolic (congestive) heart failure: Secondary | ICD-10-CM | POA: Diagnosis not present

## 2020-07-03 DIAGNOSIS — I480 Paroxysmal atrial fibrillation: Secondary | ICD-10-CM | POA: Diagnosis not present

## 2020-07-03 DIAGNOSIS — I10 Essential (primary) hypertension: Secondary | ICD-10-CM | POA: Diagnosis not present

## 2020-07-11 ENCOUNTER — Encounter: Payer: Self-pay | Admitting: Cardiology

## 2020-07-11 NOTE — Progress Notes (Signed)
Cardiology Office Note  Date: 07/12/2020   ID: [name], [name] November 11, 1930, MRN [id number]  PCP:  Neale Burly, MD  Cardiologist:  Rozann Lesches, MD Electrophysiologist:  None   Chief Complaint  Patient presents with  . Cardiac follow-up    History of Present Illness: [name] is an 87 y.o. female former patient of Dr. Bronson Ing now presenting to establish follow-up with me.  I reviewed her records and updated the chart.  She was last assessed via telehealth encounter in August 2020.  She lives in her own home and functions with ADLs including house chores and cooking.  She does use a walker to get around.  Denies any recent falls.  She remains on Coumadin for stroke prophylaxis, followed by Dr. Sherrie Sport.  Reports no spontaneous bleeding problems.  I reviewed the remainder of her medications which are outlined below.  She is on combination of Toprol-XL and Cardizem CD for heart rate control.  Does not seem symptomatic with bradycardia at rest.  I personally reviewed her ECG today which shows atrial fibrillation in the 50s with left posterior fascicular block, nonspecific ST-T changes.  Follow-up echocardiogram in May 2021 revealed LVEF 50 to 55% with moderate LVH, moderately reduced RV contraction with severe pulmonary hypertension and estimated PASP 72 mmHg, moderate tricuspid regurgitation.  Past Medical History:  Diagnosis Date  . Atrial fibrillation ([place])    Maze procedure and ligation of left atrial appendage 2010  . Coronary artery disease    Multivessel status post CABG 2010  . Endometrial ca [facility]) 09/25/2010   June, 2012, radiation and chemotherapy - Dr. Tressie Stalker  . Essential hypertension   . Hiatal hernia   . Hyperlipidemia   . Osteoarthritis   . Statin intolerance   . Type 2 diabetes mellitus ([place])     Past Surgical History:  Procedure Laterality Date  . ABDOMINAL HYSTERECTOMY  07/2010   complete, endometrial cancer  . APPENDECTOMY  60 yrs  ago  . BREAST MASS EXCISION     left breast 1985  . CARDIAC CATHETERIZATION    . CATARACT EXTRACTION Bilateral 2006   Dr. Iona Hansen  . choleycystectomy  1998  . COLONOSCOPY     several in [place], last one by Dr. Rowe Pavy, polyps once in [place]. records requested.  . COLONOSCOPY  06/2010   Dr. Rodrigo Ran good. Moderate diverticulosis in left colon. Inflammation found in descending colon, scope could not be advanced forward.  ACBE-->sigmoid diverticulosis no worrisome findings.   . CORONARY ARTERY BYPASS GRAFT  12/21/08   Prescott Gum; LIMA LAD, SVG posterior descending, SVG OM, maze procedure, ligation left atrial appendage and MAZE procedure for A.fib  . ESOPHAGOGASTRODUODENOSCOPY  03/08/2012   [id number] ring was found at the gastroesophageal junction/Nodular gastritis (inflammation)/duodenal mucosa showed no abnormalities   . PORT-A-CATH REMOVAL  01/23/2012   Procedure: REMOVAL PORT-A-CATH;  Surgeon: Jamesetta So, MD;  Location: [facility];  Service: General;  Laterality: N/A;  Minor Room  . PORTACATH PLACEMENT    . TONSILECTOMY, ADENOIDECTOMY, BILATERAL MYRINGOTOMY AND TUBES    . TUMOR REMOVAL     from throat  benign    Current Outpatient Medications  Medication Sig Dispense Refill  . Calcium Carbonate (CALCIUM 500 PO) Take 500 mg by mouth 2 (two) times daily.    . Cholecalciferol (VITAMIN D PO) Take 400 Units by mouth daily.    Marland Kitchen denosumab (PROLIA) 60 MG/ML SOSY injection Inject 60 mg into the skin every 6 (six) months.    Marland Kitchen  diltiazem (CARDIZEM CD) 240 MG 24 hr capsule Take 240 mg by mouth daily.    . Diphenhydramine-APAP, sleep, (TYLENOL PM EXTRA STRENGTH PO) Take 1 tablet by mouth at bedtime as needed. Sleep    . furosemide (LASIX) 40 MG tablet Take 1 tablet (40 mg total) by mouth daily. 30 tablet 1  . glipiZIDE (GLUCOTROL) 5 MG tablet Take 5 mg by mouth daily.    . Glucosamine 500 MG CAPS Take 500 mg by mouth 2 (two) times daily.      Marland Kitchen HYDROcodone-acetaminophen (NORCO/VICODIN)  5-325 MG per tablet Take 1 tablet by mouth every 8 (eight) hours as needed for moderate pain.    Marland Kitchen levothyroxine (SYNTHROID) 50 MCG tablet Take 1 tablet (50 mcg total) by mouth daily before breakfast. 90 tablet 1  . magnesium oxide (MAG-OX) 400 MG tablet Take 400 mg by mouth daily.    . metFORMIN (GLUCOPHAGE) 500 MG tablet Take 500 mg by mouth at bedtime. Once daily at bedtime    . metoprolol succinate (TOPROL-XL) 50 MG 24 hr tablet TAKE 1 AND 1/2 TABLETS BY MOUTH TWICE DAILY 270 tablet 2  . Multiple Vitamin (MULTIVITAMIN) tablet Take 1 tablet by mouth daily.    . Omega-3 Fatty Acids (FISH OIL) 1200 MG CAPS Take 1,200 mg by mouth 2 (two) times daily.    Marland Kitchen warfarin (COUMADIN) 5 MG tablet Take 5 mg by mouth daily. 7.5mg  alt w/ 5mg  every other day PER [name]'S OFFICE.     No current facility-administered medications for this visit.   Allergies:  Statins   ROS: No dizziness or syncope.  Physical Exam: VS:  BP (!) 112/48   Pulse (!) 53   Ht 5\' 1"  (1.549 m)   Wt 168 lb (76.2 kg)   SpO2 96%   BMI 31.74 kg/m , BMI Body mass index is 31.74 kg/m.  Wt Readings from Last 3 Encounters:  07/12/20 168 lb (76.2 kg)  02/07/20 181 lb 12.8 oz (82.5 kg)  10/25/19 178 lb (80.7 kg)    General: Elderly woman, appears comfortable at rest. HEENT: Conjunctiva and lids normal, wearing a mask. Neck: Supple, no elevated JVP or carotid bruits, no thyromegaly. Lungs: Clear to auscultation, nonlabored breathing at rest. Cardiac: Irregularly irregular, no S3, 2/6 systolic murmur. Extremities: 2+ lower leg edema and venous stasis.  ECG:  An ECG dated 11/10/2017 was personally reviewed today and demonstrated:  Atrial fibrillation.  Recent Labwork: 08/25/2019: ALT 88; AST 93; B Natriuretic Peptide 583.0 08/27/2019: BUN 29; Creatinine, Ser 0.95; Hemoglobin 9.9; Magnesium 2.0; Platelets 134; Potassium 3.4; Sodium 134 01/31/2020: TSH 6.165   Other Studies Reviewed Today:  Echocardiogram 08/25/2019: 1. Left  ventricular ejection fraction, by estimation, is 50 to 55%. The  left ventricle has low normal function. The left ventricle has no regional  wall motion abnormalities. There is moderate left ventricular hypertrophy.  Left ventricular diastolic  parameters are indeterminate.  2. Right ventricular systolic function is moderately reduced. The right  ventricular size is severely enlarged. There is severely elevated  pulmonary artery systolic pressure.  3. Right atrial size was severely dilated.  4. The mitral valve is normal in structure. Trivial mitral valve  regurgitation. No evidence of mitral stenosis.  5. Tricuspid valve regurgitation is moderate.  6. The aortic valve is tricuspid. Aortic valve regurgitation is not  visualized. No aortic stenosis is present.  7. Severe pulmonary HTN, PASP is 72 mmHg.  8. The inferior vena cava is dilated in size with <50% respiratory  variability, suggesting right atrial pressure of 15 mmHg.   Assessment and Plan:  1.  Multivessel CAD status post CABG in 2010.  She does not report any active angina symptoms.  Remains functional with ADLs.  Currently on Toprol-XL, no aspirin given use of Coumadin, also significant multistatin intolerance.  2.  Permanent atrial fibrillation, CHA2DS2-VASc score is 6.  Heart rate well controlled on combination of Cardizem CD and Toprol-XL.  In fact she is bradycardic, although not clearly symptomatic at rest.  May ultimately need to have doses cut back.  She describes no dizziness or syncope.  Continues on Coumadin with follow-up by PCP for stroke prophylaxis.  3.  Acquired thrombophilia.  CHA2DS2-VASc or is 6, she is on Coumadin for stroke prophylaxis.  No reported spontaneous bleeding problems.  4.  Multistatin intolerance.  Requesting interval lab work from Dr. Sherrie Sport.  Medication Adjustments/Labs and Tests Ordered: Current medicines are reviewed at length with the patient today.  Concerns regarding medicines are  outlined above.   Tests Ordered: Orders Placed This Encounter  Procedures  . EKG 12-Lead    Medication Changes: No orders of the defined types were placed in this encounter.   Disposition:  Follow up 1 year in the [place] office.  Signed, Satira Sark, MD, [facility] 07/12/2020 1:24 PM    [facility] [place], [place], [place] [address] Phone: [phone]; Fax: [phone]

## 2020-07-12 ENCOUNTER — Encounter: Payer: Self-pay | Admitting: Cardiology

## 2020-07-12 ENCOUNTER — Encounter: Payer: Self-pay | Admitting: *Deleted

## 2020-07-12 ENCOUNTER — Ambulatory Visit: Payer: Medicare Other | Admitting: Cardiology

## 2020-07-12 ENCOUNTER — Other Ambulatory Visit: Payer: Self-pay

## 2020-07-12 VITALS — BP 112/48 | HR 53 | Ht 61.0 in | Wt 168.0 lb

## 2020-07-12 DIAGNOSIS — I25119 Atherosclerotic heart disease of native coronary artery with unspecified angina pectoris: Secondary | ICD-10-CM

## 2020-07-12 DIAGNOSIS — I4821 Permanent atrial fibrillation: Secondary | ICD-10-CM

## 2020-07-12 DIAGNOSIS — Z789 Other specified health status: Secondary | ICD-10-CM | POA: Diagnosis not present

## 2020-07-12 NOTE — Patient Instructions (Addendum)

## 2020-07-19 DIAGNOSIS — M810 Age-related osteoporosis without current pathological fracture: Secondary | ICD-10-CM | POA: Diagnosis not present

## 2020-07-23 ENCOUNTER — Other Ambulatory Visit: Payer: Self-pay | Admitting: "Endocrinology

## 2020-08-07 DIAGNOSIS — I480 Paroxysmal atrial fibrillation: Secondary | ICD-10-CM | POA: Diagnosis not present

## 2020-09-14 DIAGNOSIS — I480 Paroxysmal atrial fibrillation: Secondary | ICD-10-CM | POA: Diagnosis not present

## 2020-09-18 NOTE — Progress Notes (Signed)
[facility] Note  09/25/2020     CHIEF COMPLAINT Patient presents for Retina Follow Up   HISTORY OF PRESENT ILLNESS: [name] is a 87 y.o. female who presents to the clinic today for:  HPI     Retina Follow Up   Patient presents with  Diabetic Retinopathy.  In both eyes.  This started months ago.  Severity is moderate.  Duration of 4 months.  Since onset it is stable.  I, the attending physician,  performed the HPI with the patient and updated documentation appropriately.        Comments   87 y/o female pt here for 4 mo f/u for mod NPDR w/DME OU.  No change in [place] OU.  Denies pain, FOL, new floaters.  No gtts.  BS unknown.  A1C 6.2 on 07/2020.      Last edited by Bernarda Caffey, MD on 09/27/2020  1:13 AM.       Referring physician: Leticia Clas, [place]. [place],  [place] [address]  Patient states no changes in vision OU. Last was 6.2 in April 2022.  HISTORICAL INFORMATION:   Selected notes from the MEDICAL RECORD NUMBER Referred by Dr. Leticia Clas for concern of NPDR with mac edema OU LEE: 12.29.20 Leander Rams) [BCVA: OD: 20/30- OS: 20/40-] Ocular Hx-pseduo OU Iona Hansen, '06), PC fibrosis, PCO, PVD, asteroid OU, nevus OD, drusen OU, HTN ret OU   CURRENT MEDICATIONS: No current outpatient medications on file. (Ophthalmic Drugs)   No current facility-administered medications for this visit. (Ophthalmic Drugs)   Current Outpatient Medications (Other)  Medication Sig   Blood Glucose Monitoring Suppl (ONETOUCH VERIO REFLECT) w/Device KIT See admin instructions.   Calcium Carbonate (CALCIUM 500 PO) Take 500 mg by mouth 2 (two) times daily.   Cholecalciferol (VITAMIN D PO) Take 400 Units by mouth daily.   denosumab (PROLIA) 60 MG/ML SOSY injection Inject 60 mg into the skin every 6 (six) months.   diltiazem (CARDIZEM CD) 240 MG 24 hr capsule Take 240 mg by mouth daily.   Diphenhydramine-APAP, sleep, (TYLENOL PM EXTRA  STRENGTH PO) Take 1 tablet by mouth at bedtime as needed. Sleep   furosemide (LASIX) 40 MG tablet Take 1 tablet (40 mg total) by mouth daily.   glipiZIDE (GLUCOTROL) 5 MG tablet Take 5 mg by mouth daily.   Glucosamine 500 MG CAPS Take 500 mg by mouth 2 (two) times daily.     HYDROcodone-acetaminophen (NORCO/VICODIN) 5-325 MG per tablet Take 1 tablet by mouth every 8 (eight) hours as needed for moderate pain.   levothyroxine (SYNTHROID) 25 MCG tablet Take 25 mcg by mouth every morning.   levothyroxine (SYNTHROID) 50 MCG tablet TAKE 1 TABLET BY MOUTH DAILY BEFORE BREAKFAST   magnesium oxide (MAG-OX) 400 MG tablet Take 400 mg by mouth daily.   metFORMIN (GLUCOPHAGE) 500 MG tablet Take 500 mg by mouth at bedtime. Once daily at bedtime   metoprolol succinate (TOPROL-XL) 50 MG 24 hr tablet TAKE 1 AND 1/2 TABLETS BY MOUTH TWICE DAILY   Multiple Vitamin (MULTIVITAMIN) tablet Take 1 tablet by mouth daily.   Omega-3 Fatty Acids (FISH OIL) 1200 MG CAPS Take 1,200 mg by mouth 2 (two) times daily.   ONETOUCH VERIO test strip SMARTSIG:Via Meter 3 Times a Week   warfarin (COUMADIN) 5 MG tablet Take 5 mg by mouth daily. 7.42m alt w/ 593mevery other day PER [name]'S OFFICE.   No current facility-administered medications  for this visit. (Other)      REVIEW OF SYSTEMS: ROS   Positive for: Gastrointestinal, Musculoskeletal, Endocrine, Cardiovascular, Eyes, Respiratory Negative for: Constitutional, Neurological, Skin, Genitourinary, HENT, Psychiatric, Allergic/Imm, Heme/Lymph Last edited by Matthew Folks, COA on 09/25/2020  9:55 AM.        ALLERGIES Allergies  Allergen Reactions   Statins Other (See Comments)    No energy, fatigue.     PAST MEDICAL HISTORY Past Medical History:  Diagnosis Date   Atrial fibrillation [facility])    Maze procedure and ligation of left atrial appendage 2010   Coronary artery disease    Multivessel status post CABG 2010   Diabetic retinopathy [facility])    Endometrial ca  ([place]) 09/25/2010   June, 2012, radiation and chemotherapy - Dr. Tressie Stalker   Essential hypertension    Hiatal hernia    Hyperlipidemia    Hypertensive retinopathy    Osteoarthritis    Statin intolerance    Type 2 diabetes mellitus ([place])    Past Surgical History:  Procedure Laterality Date   ABDOMINAL HYSTERECTOMY  07/2010   complete, endometrial cancer   APPENDECTOMY  60 yrs ago   BREAST MASS EXCISION     left breast [place] Bilateral 2006   Dr. Iona Hansen   choleycystectomy  1998   COLONOSCOPY     several in [place], last one by Dr. Rowe Pavy, polyps once in [place]. records requested.   COLONOSCOPY  06/2010   Dr. Rodrigo Ran good. Moderate diverticulosis in left colon. Inflammation found in descending colon, scope could not be advanced forward.  ACBE-->sigmoid diverticulosis no worrisome findings.    CORONARY ARTERY BYPASS GRAFT  12/21/2008   Prescott Gum; LIMA LAD, SVG posterior descending, SVG OM, maze procedure, ligation left atrial appendage and MAZE procedure for A.fib   ESOPHAGOGASTRODUODENOSCOPY  03/08/2012   [id number] ring was found at the gastroesophageal junction/Nodular gastritis (inflammation)/duodenal mucosa showed no abnormalities    EYE SURGERY     PORT-A-CATH REMOVAL  01/23/2012   Procedure: REMOVAL PORT-A-CATH;  Surgeon: Jamesetta So, MD;  Location: [facility];  Service: General;  Laterality: N/A;  Minor Room   PORTACATH PLACEMENT     TONSILECTOMY, ADENOIDECTOMY, BILATERAL MYRINGOTOMY AND TUBES     TUMOR REMOVAL     from throat  benign    FAMILY HISTORY Family History  Problem Relation Age of Onset   Hypertension Mother    Breast cancer Mother    Heart attack Mother        deceased, age 66   Cancer Mother    Diabetes Sister        deceased - 26, renal failure,    Heart attack Father    Cancer Father    Colon cancer Neg Hx    Ovarian cancer Neg Hx    Lung cancer Neg Hx    Liver disease Neg Hx     SOCIAL  HISTORY Social History   Tobacco Use   Smoking status: Never   Smokeless tobacco: Never  Substance Use Topics   Alcohol use: No    Alcohol/week: 0.0 standard drinks   Drug use: No         OPHTHALMIC EXAM:  Base Eye Exam     Visual Acuity (Snellen - Linear)       Right Left   Dist cc 20/40 -2 20/70 -2   Dist ph cc 20/25 20/30 -2    Correction: Glasses  Tonometry (Tonopen, 9:58 AM)       Right Left   Pressure 12 13         Pupils       Dark Light Shape React APD   Right 3 2 Round Brisk None   Left 3 2 Round Brisk None         Visual Fields (Counting fingers)       Left Right    Full Full         Extraocular Movement       Right Left    Full, Ortho Full, Ortho         Neuro/Psych     Oriented x3: Yes   Mood/Affect: Normal         Dilation     Both eyes: 1.0% Mydriacyl, 2.5% Phenylephrine @ 9:58 AM           Slit Lamp and Fundus Exam     Slit Lamp Exam       Right Left   Lids/Lashes Dermatochalasis - upper lid Dermatochalasis - upper lid   Conjunctiva/Sclera White and quiet White and quiet   Cornea Trace Punctate epithelial erosions, mild EBMD, mild Debris in tear film 1+ Punctate epithelial erosions, mild EBMD, mild Debris in tear film   Anterior Chamber Deep and quiet Deep and quiet   Iris Round and dilated, No NVI Round and dilated, No NVI   Lens PC IOL in good position, 2+ Posterior capsular opacification approaching center PC IOL in good position, mild anterior capsular phimosis, trace, non-central Posterior capsular opacification   Vitreous Vitreous syneresis, Posterior vitreous detachment, vitreous condensations Vitreous syneresis, Posterior vitreous detachment         Fundus Exam       Right Left   Disc Tilted disc, mild temporal Pallor, temporal PPA, Sharp rim Compact, mild Pallor, mildly Tilted disc, mild PPA   C/D Ratio 0.3 0.2   Macula Flat, Blunted foveal reflex, cluster of Microaneurysms IT and  temporal to fovea -- improved, +cystic changes - improved/resolved, mild focal laser changes IT Flat, Blunted foveal reflex, mild RPE mottling and clumping, scattered Microaneurysms, no edema   Vessels attenuated, mild tortuousity attenuated, mild tortuousity   Periphery Attached, scattered MA/DBH and peripheral drusen, ?irregular nevus at 1000 w/overlying drusen -- stable Attached, rare MA/DBH greatest outside ST arcades, scattered peripheral drusen            IMAGING AND PROCEDURES  Imaging and Procedures for _0 @  OCT, Retina - OU - Both Eyes       Right Eye Quality was good. Central Foveal Thickness: 239. Progression has improved. Findings include abnormal foveal contour, intraretinal fluid, no SRF (Interval improvement in central cystic changes / IRF; focal ORA IT to fovea -- focal laser changes).   Left Eye Quality was good. Central Foveal Thickness: 223. Progression has been stable. Findings include normal foveal contour, no SRF, no IRF (Stable improvement in cystic changes).   Notes *Images captured and stored on drive  Diagnosis / Impression:  OD: Interval improvement in central cystic changes / IRF; focal ORA IT to fovea -- focal laser changes OS: NFP; +cystic changes / DME; no SRF -- Stable improvement in cystic changes  Clinical management:  See below  Abbreviations: NFP - Normal foveal profile. CME - cystoid macular edema. PED - pigment epithelial detachment. IRF - intraretinal fluid. SRF - subretinal fluid. EZ - ellipsoid zone. ERM - epiretinal membrane. ORA - outer retinal atrophy. ORT - outer  retinal tubulation. SRHM - subretinal hyper-reflective material               ASSESSMENT/PLAN:    ICD-10-CM   1. Moderate nonproliferative diabetic retinopathy of both eyes with macular edema associated with type 2 diabetes mellitus ([place])  Q22.2979     2. Retinal edema  H35.81 OCT, Retina - OU - Both Eyes    3. Essential hypertension  I10     4. Hypertensive  retinopathy of both eyes  H35.033     5. Pseudophakia of both eyes  Z96.1     6. Choroidal nevus, right eye  D31.31      1,2. Moderate non-proliferative diabetic retinopathy, OU  - s/p focal laser OD (10.05.21)  - pt reports subjective improvement in vision OD  - FA 04.30.21 shows late leaking MA OU, mild vasc perfusion defects OU, no NV  - today, BCVA 20/25 OD (stable) and 20/30-2 OS (stable)  - exam shows scattered MA/IRH OU (OD > OS) -- OD with perifoveal cluster of MA and edema -- improving  - OCT shows OD: Interval improvement in central cystic changes / IRF; focal ORA IT to fovea -- focal laser changes; OS: Stable improvement in cystic changes  - no intervention recommended today  - monitor  - f/u 6-9 months, DFE, OCT  3,4. Hypertensive retinopathy OU  - discussed importance of tight BP control  - monitor  5. Pseudophakia OU  - s/p CE/IOL OU (Dr. Iona Hansen, 2006)  - IOLs in good position, doing well  - monitor  6. Choroidal Nevus OD  - flat, irregular pigmented lesion at 1000 OD  - +overlying drusen; no SRF or orange pigment  - monitor  Ophthalmic Meds Ordered this visit:  No orders of the defined types were placed in this encounter.     Return 6-9 months, for DFE, OCT.  There are no Patient Instructions on file for this visit.  Explained the diagnoses, plan, and follow up with the patient and they expressed understanding.  Patient expressed understanding of the importance of proper follow up care.   This document serves as a record of services personally performed by Gardiner Sleeper, MD, PhD. It was created on their behalf by Estill Bakes, COT an ophthalmic technician. The creation of this record is the provider's dictation and/or activities during the visit.    Electronically signed by: Estill Bakes, COT 6.14.22 @ 1:14 AM   This document serves as a record of services personally performed by Gardiner Sleeper, MD, PhD. It was created on their behalf by Roselee Nova, COMT. The creation of this record is the provider's dictation and/or activities during the visit.  Electronically signed by: Roselee Nova, COMT 09/27/20 1:14 AM  Gardiner Sleeper, M.D., Ph.D. Diseases & Surgery of the Retina and Vitreous [facility]  I have reviewed the above documentation for accuracy and completeness, and I agree with the above. Gardiner Sleeper, M.D., Ph.D. 09/27/20 1:14 AM   Abbreviations: M myopia (nearsighted); A astigmatism; H hyperopia (farsighted); P presbyopia; Mrx spectacle prescription;  CTL contact lenses; OD right eye; OS left eye; OU both eyes  XT exotropia; ET esotropia; PEK punctate epithelial keratitis; PEE punctate epithelial erosions; DES dry eye syndrome; MGD meibomian gland dysfunction; ATs artificial tears; PFAT's preservative free artificial tears; [place] nuclear sclerotic cataract; PSC posterior subcapsular cataract; ERM epi-retinal membrane; PVD posterior vitreous detachment; RD retinal detachment; DM diabetes mellitus; DR diabetic retinopathy; NPDR non-proliferative diabetic retinopathy; PDR  proliferative diabetic retinopathy; CSME clinically significant macular edema; DME diabetic macular edema; dbh dot blot hemorrhages; CWS cotton wool spot; POAG primary open angle glaucoma; C/D cup-to-disc ratio; HVF humphrey visual field; GVF goldmann visual field; OCT optical coherence tomography; IOP intraocular pressure; BRVO Branch retinal vein occlusion; CRVO central retinal vein occlusion; CRAO central retinal artery occlusion; BRAO branch retinal artery occlusion; RT retinal tear; SB scleral buckle; PPV pars plana vitrectomy; VH Vitreous hemorrhage; PRP panretinal laser photocoagulation; IVK intravitreal kenalog; VMT vitreomacular traction; MH Macular hole;  NVD neovascularization of the disc; NVE neovascularization elsewhere; AREDS age related eye disease study; ARMD age related macular degeneration; POAG primary open angle glaucoma; EBMD  epithelial/anterior basement membrane dystrophy; ACIOL anterior chamber intraocular lens; IOL intraocular lens; PCIOL posterior chamber intraocular lens; Phaco/IOL phacoemulsification with intraocular lens placement; [place] photorefractive keratectomy; LASIK laser assisted in situ keratomileusis; HTN hypertension; DM diabetes mellitus; COPD chronic obstructive pulmonary disease

## 2020-09-25 ENCOUNTER — Other Ambulatory Visit: Payer: Self-pay

## 2020-09-25 ENCOUNTER — Encounter (INDEPENDENT_AMBULATORY_CARE_PROVIDER_SITE_OTHER): Payer: Self-pay | Admitting: Ophthalmology

## 2020-09-25 ENCOUNTER — Ambulatory Visit (INDEPENDENT_AMBULATORY_CARE_PROVIDER_SITE_OTHER): Payer: Medicare Other | Admitting: Ophthalmology

## 2020-09-25 DIAGNOSIS — Z961 Presence of intraocular lens: Secondary | ICD-10-CM

## 2020-09-25 DIAGNOSIS — H3581 Retinal edema: Secondary | ICD-10-CM

## 2020-09-25 DIAGNOSIS — E113313 Type 2 diabetes mellitus with moderate nonproliferative diabetic retinopathy with macular edema, bilateral: Secondary | ICD-10-CM | POA: Diagnosis not present

## 2020-09-25 DIAGNOSIS — H35033 Hypertensive retinopathy, bilateral: Secondary | ICD-10-CM

## 2020-09-25 DIAGNOSIS — I10 Essential (primary) hypertension: Secondary | ICD-10-CM

## 2020-09-25 DIAGNOSIS — D3131 Benign neoplasm of right choroid: Secondary | ICD-10-CM

## 2020-10-16 DIAGNOSIS — E782 Mixed hyperlipidemia: Secondary | ICD-10-CM | POA: Diagnosis not present

## 2020-10-16 DIAGNOSIS — E1121 Type 2 diabetes mellitus with diabetic nephropathy: Secondary | ICD-10-CM | POA: Diagnosis not present

## 2020-10-16 DIAGNOSIS — I480 Paroxysmal atrial fibrillation: Secondary | ICD-10-CM | POA: Diagnosis not present

## 2020-10-16 DIAGNOSIS — I5022 Chronic systolic (congestive) heart failure: Secondary | ICD-10-CM | POA: Diagnosis not present

## 2020-10-16 DIAGNOSIS — I10 Essential (primary) hypertension: Secondary | ICD-10-CM | POA: Diagnosis not present

## 2020-10-21 ENCOUNTER — Other Ambulatory Visit: Payer: Self-pay | Admitting: "Endocrinology

## 2020-12-13 DIAGNOSIS — M810 Age-related osteoporosis without current pathological fracture: Secondary | ICD-10-CM | POA: Diagnosis not present

## 2020-12-20 DIAGNOSIS — I480 Paroxysmal atrial fibrillation: Secondary | ICD-10-CM | POA: Diagnosis not present

## 2021-01-17 DIAGNOSIS — I700 Atherosclerosis of aorta: Secondary | ICD-10-CM | POA: Diagnosis not present

## 2021-01-17 DIAGNOSIS — I10 Essential (primary) hypertension: Secondary | ICD-10-CM | POA: Diagnosis not present

## 2021-01-17 DIAGNOSIS — Z0000 Encounter for general adult medical examination without abnormal findings: Secondary | ICD-10-CM | POA: Diagnosis not present

## 2021-01-17 DIAGNOSIS — E1121 Type 2 diabetes mellitus with diabetic nephropathy: Secondary | ICD-10-CM | POA: Diagnosis not present

## 2021-01-17 DIAGNOSIS — I5022 Chronic systolic (congestive) heart failure: Secondary | ICD-10-CM | POA: Diagnosis not present

## 2021-01-17 DIAGNOSIS — E782 Mixed hyperlipidemia: Secondary | ICD-10-CM | POA: Diagnosis not present

## 2021-01-17 DIAGNOSIS — Z1331 Encounter for screening for depression: Secondary | ICD-10-CM | POA: Diagnosis not present

## 2021-01-19 ENCOUNTER — Other Ambulatory Visit: Payer: Self-pay | Admitting: Nurse Practitioner

## 2021-01-29 DIAGNOSIS — M810 Age-related osteoporosis without current pathological fracture: Secondary | ICD-10-CM | POA: Diagnosis not present

## 2021-02-21 DIAGNOSIS — I480 Paroxysmal atrial fibrillation: Secondary | ICD-10-CM | POA: Diagnosis not present

## 2021-03-22 DIAGNOSIS — I480 Paroxysmal atrial fibrillation: Secondary | ICD-10-CM | POA: Diagnosis not present

## 2021-05-09 DIAGNOSIS — Z7901 Long term (current) use of anticoagulants: Secondary | ICD-10-CM | POA: Diagnosis not present

## 2021-05-09 DIAGNOSIS — E1121 Type 2 diabetes mellitus with diabetic nephropathy: Secondary | ICD-10-CM | POA: Diagnosis not present

## 2021-05-09 DIAGNOSIS — I700 Atherosclerosis of aorta: Secondary | ICD-10-CM | POA: Diagnosis not present

## 2021-05-09 DIAGNOSIS — E782 Mixed hyperlipidemia: Secondary | ICD-10-CM | POA: Diagnosis not present

## 2021-05-09 DIAGNOSIS — I10 Essential (primary) hypertension: Secondary | ICD-10-CM | POA: Diagnosis not present

## 2021-06-13 DIAGNOSIS — I480 Paroxysmal atrial fibrillation: Secondary | ICD-10-CM | POA: Diagnosis not present

## 2021-06-21 NOTE — Progress Notes (Signed)
?[facility] Note ? ?06/25/2021 ? ?  ? ?CHIEF COMPLAINT ?Patient presents for Retina Follow Up ? ? ?HISTORY OF PRESENT ILLNESS: ?[name] is a 88 y.o. female who presents to the clinic today for:  ?HPI   ? ? Retina Follow Up   ?Patient presents with  Diabetic Retinopathy.  In both eyes.  Severity is moderate.  Duration of 9 months.  Since onset it is stable.  I, the attending physician,  performed the HPI with the patient and updated documentation appropriately. ? ?  ?  ? ? Comments   ?Pt here for 9 mo ret f/u for NPDR OU. Pt states VA is stable, no change. Last A1C was 6.7.  ? ?  ?  ?Last edited by Bernarda Caffey, MD on 06/26/2021  9:46 PM.  ?  ?A1C 6.7 ?  ?Referring physician: ?Neale Burly, MD ?[address] ?[place],  [place] [address] ? ?HISTORICAL INFORMATION:  ? ?Selected notes from the [place] ?Referred by Dr. Leticia Clas for concern of NPDR with mac edema OU ?LEE: 12.29.20 Leander Rams) [BCVA: OD: 20/30- OS: 20/40-] ?Ocular Hx-pseduo OU Iona Hansen, '06), PC fibrosis, PCO, PVD, asteroid OU, nevus OD, drusen OU, HTN ret OU  ? ?CURRENT MEDICATIONS: ?No current outpatient medications on file. (Ophthalmic Drugs)  ? ?No current facility-administered medications for this visit. (Ophthalmic Drugs)  ? ?Current Outpatient Medications (Other)  ?Medication Sig  ? Blood Glucose Monitoring Suppl (ONETOUCH VERIO REFLECT) w/Device KIT See admin instructions.  ? Calcium Carbonate (CALCIUM 500 PO) Take 500 mg by mouth 2 (two) times daily.  ? Cholecalciferol (VITAMIN D PO) Take 400 Units by mouth daily.  ? denosumab (PROLIA) 60 MG/ML SOSY injection Inject 60 mg into the skin every 6 (six) months.  ? diltiazem (CARDIZEM CD) 240 MG 24 hr capsule Take 240 mg by mouth daily.  ? Diphenhydramine-APAP, sleep, (TYLENOL PM EXTRA STRENGTH PO) Take 1 tablet by mouth at bedtime as needed. Sleep  ? furosemide (LASIX) 40 MG tablet Take 1 tablet (40 mg total) by mouth daily.  ? glipiZIDE  (GLUCOTROL) 5 MG tablet Take 5 mg by mouth daily.  ? Glucosamine 500 MG CAPS Take 500 mg by mouth 2 (two) times daily.    ? HYDROcodone-acetaminophen (NORCO/VICODIN) 5-325 MG per tablet Take 1 tablet by mouth every 8 (eight) hours as needed for moderate pain.  ? levothyroxine (SYNTHROID) 25 MCG tablet Take 25 mcg by mouth every morning.  ? levothyroxine (SYNTHROID) 50 MCG tablet TAKE 1 TABLET BY MOUTH DAILY BEFORE BREAKFAST  ? magnesium oxide (MAG-OX) 400 MG tablet Take 400 mg by mouth daily.  ? metFORMIN (GLUCOPHAGE) 500 MG tablet Take 500 mg by mouth at bedtime. Once daily at bedtime  ? metoprolol succinate (TOPROL-XL) 50 MG 24 hr tablet TAKE 1 AND 1/2 TABLETS BY MOUTH TWICE DAILY  ? Multiple Vitamin (MULTIVITAMIN) tablet Take 1 tablet by mouth daily.  ? Omega-3 Fatty Acids (FISH OIL) 1200 MG CAPS Take 1,200 mg by mouth 2 (two) times daily.  ? ONETOUCH VERIO test strip SMARTSIG:Via Meter 3 Times a Week  ? warfarin (COUMADIN) 5 MG tablet Take 5 mg by mouth daily. 7.60m alt w/ 532mevery other day PER [name]'S OFFICE.  ? ?No current facility-administered medications for this visit. (Other)  ? ?REVIEW OF SYSTEMS: ?ROS   ?Positive for: Gastrointestinal, Musculoskeletal, Endocrine, Cardiovascular, Eyes, Respiratory ?Negative for: Constitutional, Neurological, Skin, Genitourinary, HENT, Psychiatric, Allergic/Imm, Heme/Lymph ?Last edited by SiOrvan Falconer  E, COT on 06/25/2021 10:15 AM.  ?  ? ?ALLERGIES ?Allergies  ?Allergen Reactions  ? Statins Other (See Comments)  ?  No energy, fatigue.   ? ?PAST MEDICAL HISTORY ?Past Medical History:  ?Diagnosis Date  ? Atrial fibrillation ([place])   ? Maze procedure and ligation of left atrial appendage 2010  ? Coronary artery disease   ? Multivessel status post CABG 2010  ? Diabetic retinopathy ([place])   ? Endometrial ca ([facility]) 09/25/2010  ? June, 2012, radiation and chemotherapy - Dr. Tressie Stalker  ? Essential hypertension   ? Hiatal hernia   ? Hyperlipidemia   ? Hypertensive retinopathy    ? Osteoarthritis   ? Statin intolerance   ? Type 2 diabetes mellitus ([place])   ? ?Past Surgical History:  ?Procedure Laterality Date  ? ABDOMINAL HYSTERECTOMY  07/2010  ? complete, endometrial cancer  ? APPENDECTOMY  60 yrs ago  ? BREAST MASS EXCISION    ? left breast 1985  ? CARDIAC CATHETERIZATION    ? CATARACT EXTRACTION Bilateral 2006  ? Dr. Iona Hansen  ? choleycystectomy  1998  ? COLONOSCOPY    ? several in [place], last one by Dr. Rowe Pavy, polyps once in [place]. records requested.  ? COLONOSCOPY  06/2010  ? Dr. Rodrigo Ran good. Moderate diverticulosis in left colon. Inflammation found in descending colon, scope could not be advanced forward.  ACBE-->sigmoid diverticulosis no worrisome findings.   ? CORONARY ARTERY BYPASS GRAFT  12/21/2008  ? [place] Gum; LIMA LAD, SVG posterior descending, SVG OM, maze procedure, ligation left atrial appendage and MAZE procedure for A.fib  ? ESOPHAGOGASTRODUODENOSCOPY  03/08/2012  ? [id number] ring was found at the gastroesophageal junction/Nodular gastritis (inflammation)/duodenal mucosa showed no abnormalities   ? EYE SURGERY    ? PORT-A-CATH REMOVAL  01/23/2012  ? Procedure: REMOVAL PORT-A-CATH;  Surgeon: Jamesetta So, MD;  Location: [facility];  Service: General;  Laterality: N/A;  Minor Room  ? PORTACATH PLACEMENT    ? TONSILECTOMY, ADENOIDECTOMY, BILATERAL MYRINGOTOMY AND TUBES    ? TUMOR REMOVAL    ? from throat  benign  ? ?FAMILY HISTORY ?Family History  ?Problem Relation Age of Onset  ? Hypertension Mother   ? Breast cancer Mother   ? Heart attack Mother   ?     deceased, age 6  ? Cancer Mother   ? Diabetes Sister   ?     deceased - 42, renal failure,   ? Heart attack Father   ? Cancer Father   ? Colon cancer Neg Hx   ? Ovarian cancer Neg Hx   ? Lung cancer Neg Hx   ? Liver disease Neg Hx   ? ?SOCIAL HISTORY ?Social History  ? ?Tobacco Use  ? Smoking status: Never  ? Smokeless tobacco: Never  ?Substance Use Topics  ? Alcohol use: No  ?  Alcohol/week: 0.0 standard drinks   ? Drug use: No  ?  ? ?  ?OPHTHALMIC EXAM: ? ?Base Eye Exam   ? ? Visual Acuity (Snellen - Linear)   ? ?   Right Left  ? Dist cc 20/40 -2 20/60 -1  ? Dist ph cc 20/20 -2 20/30  ? ? Correction: Glasses  ? ?  ?  ? ? Tonometry (Tonopen, 10:24 AM)   ? ?   Right Left  ? Pressure 9 13  ? ?  ?  ? ? Pupils   ? ?   Dark Light Shape React APD  ? Right 3 2 Round  Brisk None  ? Left 3 2 Round Brisk None  ? ?  ?  ? ? Visual Fields (Counting fingers)   ? ?   Left Right  ?  Full Full  ? ?  ?  ? ? Extraocular Movement   ? ?   Right Left  ?  Full, Ortho Full, Ortho  ? ?  ?  ? ? Neuro/Psych   ? ? Oriented x3: Yes  ? Mood/Affect: Normal  ? ?  ?  ? ? Dilation   ? ? Both eyes: 1.0% Mydriacyl, 2.5% Phenylephrine @ 10:24 AM  ? ?  ?  ? ?  ? ?Slit Lamp and Fundus Exam   ? ? Slit Lamp Exam   ? ?   Right Left  ? Lids/Lashes Dermatochalasis - upper lid Dermatochalasis - upper lid  ? Conjunctiva/Sclera White and quiet White and quiet  ? Cornea Trace Punctate epithelial erosions, mild EBMD, mild Debris in tear film 1+ Punctate epithelial erosions, mild EBMD, mild Debris in tear film, Well healed temporal cataract wound  ? Anterior Chamber Deep and quiet Deep and quiet  ? Iris Round and dilated, No NVI Round and dilated, No NVI  ? Lens PC IOL in good position, 2+ Posterior capsular opacification approaching center PC IOL in good position, mild anterior capsular phimosis, trace, non-central Posterior capsular opacification  ? Anterior Vitreous Vitreous syneresis, Posterior vitreous detachment, vitreous condensations Vitreous syneresis, Posterior vitreous detachment, Vitreous condensations  ? ?  ?  ? ? Fundus Exam   ? ?   Right Left  ? Disc Tilted disc, mild temporal Pallor, temporal PPA, Sharp rim Compact, mild Pallor, mildly Tilted disc, mild PPA  ? C/D Ratio 0.3 0.2  ? Macula Flat, Blunted foveal reflex, cluster of Microaneurysms IT and temporal to fovea -- improved, +cystic changes - improved/stably resolved, mild focal laser changes IT Flat,  Blunted foveal reflex, mild RPE mottling and clumping, scattered Microaneurysms, no edema  ? Vessels attenuated, mild tortuousity attenuated, mild tortuousity  ? Periphery Attached, scattered MA/DBH and periph

## 2021-06-25 ENCOUNTER — Ambulatory Visit (INDEPENDENT_AMBULATORY_CARE_PROVIDER_SITE_OTHER): Payer: Medicare Other | Admitting: Ophthalmology

## 2021-06-25 ENCOUNTER — Encounter (INDEPENDENT_AMBULATORY_CARE_PROVIDER_SITE_OTHER): Payer: Self-pay | Admitting: Ophthalmology

## 2021-06-25 ENCOUNTER — Other Ambulatory Visit: Payer: Self-pay

## 2021-06-25 DIAGNOSIS — Z961 Presence of intraocular lens: Secondary | ICD-10-CM | POA: Diagnosis not present

## 2021-06-25 DIAGNOSIS — H35033 Hypertensive retinopathy, bilateral: Secondary | ICD-10-CM

## 2021-06-25 DIAGNOSIS — E113313 Type 2 diabetes mellitus with moderate nonproliferative diabetic retinopathy with macular edema, bilateral: Secondary | ICD-10-CM

## 2021-06-25 DIAGNOSIS — I10 Essential (primary) hypertension: Secondary | ICD-10-CM | POA: Diagnosis not present

## 2021-06-25 DIAGNOSIS — D3131 Benign neoplasm of right choroid: Secondary | ICD-10-CM | POA: Diagnosis not present

## 2021-06-26 ENCOUNTER — Encounter (INDEPENDENT_AMBULATORY_CARE_PROVIDER_SITE_OTHER): Payer: Self-pay | Admitting: Ophthalmology

## 2021-07-23 DIAGNOSIS — I480 Paroxysmal atrial fibrillation: Secondary | ICD-10-CM | POA: Diagnosis not present

## 2021-08-15 DIAGNOSIS — I480 Paroxysmal atrial fibrillation: Secondary | ICD-10-CM | POA: Diagnosis not present

## 2021-08-15 DIAGNOSIS — N3942 Incontinence without sensory awareness: Secondary | ICD-10-CM | POA: Diagnosis not present

## 2021-08-15 DIAGNOSIS — E782 Mixed hyperlipidemia: Secondary | ICD-10-CM | POA: Diagnosis not present

## 2021-08-15 DIAGNOSIS — I700 Atherosclerosis of aorta: Secondary | ICD-10-CM | POA: Diagnosis not present

## 2021-08-15 DIAGNOSIS — Z6832 Body mass index (BMI) 32.0-32.9, adult: Secondary | ICD-10-CM | POA: Diagnosis not present

## 2021-08-15 DIAGNOSIS — I10 Essential (primary) hypertension: Secondary | ICD-10-CM | POA: Diagnosis not present

## 2021-08-15 DIAGNOSIS — E1121 Type 2 diabetes mellitus with diabetic nephropathy: Secondary | ICD-10-CM | POA: Diagnosis not present

## 2021-09-27 DIAGNOSIS — I480 Paroxysmal atrial fibrillation: Secondary | ICD-10-CM | POA: Diagnosis not present

## 2021-09-28 ENCOUNTER — Emergency Department (HOSPITAL_COMMUNITY): Payer: Medicare Other

## 2021-09-28 ENCOUNTER — Encounter (HOSPITAL_COMMUNITY): Payer: Self-pay

## 2021-09-28 ENCOUNTER — Observation Stay (HOSPITAL_COMMUNITY)
Admission: EM | Admit: 2021-09-28 | Discharge: 2021-10-02 | Disposition: A | Discharge status: Home Health | Payer: Medicare Other | Attending: Internal Medicine | Admitting: Internal Medicine

## 2021-09-28 ENCOUNTER — Other Ambulatory Visit: Payer: Self-pay

## 2021-09-28 DIAGNOSIS — E039 Hypothyroidism, unspecified: Secondary | ICD-10-CM | POA: Diagnosis present

## 2021-09-28 DIAGNOSIS — D68318 Other hemorrhagic disorder due to intrinsic circulating anticoagulants, antibodies, or inhibitors: Secondary | ICD-10-CM | POA: Diagnosis not present

## 2021-09-28 DIAGNOSIS — S3210XD Unspecified fracture of sacrum, subsequent encounter for fracture with routine healing: Secondary | ICD-10-CM | POA: Diagnosis not present

## 2021-09-28 DIAGNOSIS — Z951 Presence of aortocoronary bypass graft: Secondary | ICD-10-CM | POA: Diagnosis not present

## 2021-09-28 DIAGNOSIS — I10 Essential (primary) hypertension: Secondary | ICD-10-CM | POA: Diagnosis present

## 2021-09-28 DIAGNOSIS — Z7901 Long term (current) use of anticoagulants: Secondary | ICD-10-CM

## 2021-09-28 DIAGNOSIS — M6281 Muscle weakness (generalized): Secondary | ICD-10-CM | POA: Insufficient documentation

## 2021-09-28 DIAGNOSIS — Z8542 Personal history of malignant neoplasm of other parts of uterus: Secondary | ICD-10-CM | POA: Insufficient documentation

## 2021-09-28 DIAGNOSIS — D689 Coagulation defect, unspecified: Secondary | ICD-10-CM

## 2021-09-28 DIAGNOSIS — N3001 Acute cystitis with hematuria: Secondary | ICD-10-CM | POA: Diagnosis not present

## 2021-09-28 DIAGNOSIS — S3282XA Multiple fractures of pelvis without disruption of pelvic ring, initial encounter for closed fracture: Secondary | ICD-10-CM | POA: Diagnosis not present

## 2021-09-28 DIAGNOSIS — N3091 Cystitis, unspecified with hematuria: Secondary | ICD-10-CM | POA: Diagnosis not present

## 2021-09-28 DIAGNOSIS — E785 Hyperlipidemia, unspecified: Secondary | ICD-10-CM | POA: Diagnosis present

## 2021-09-28 DIAGNOSIS — E11319 Type 2 diabetes mellitus with unspecified diabetic retinopathy without macular edema: Secondary | ICD-10-CM | POA: Insufficient documentation

## 2021-09-28 DIAGNOSIS — I2510 Atherosclerotic heart disease of native coronary artery without angina pectoris: Secondary | ICD-10-CM | POA: Diagnosis present

## 2021-09-28 DIAGNOSIS — R2689 Other abnormalities of gait and mobility: Secondary | ICD-10-CM | POA: Diagnosis not present

## 2021-09-28 DIAGNOSIS — N939 Abnormal uterine and vaginal bleeding, unspecified: Secondary | ICD-10-CM | POA: Diagnosis present

## 2021-09-28 DIAGNOSIS — I350 Nonrheumatic aortic (valve) stenosis: Secondary | ICD-10-CM | POA: Diagnosis not present

## 2021-09-28 DIAGNOSIS — K449 Diaphragmatic hernia without obstruction or gangrene: Secondary | ICD-10-CM | POA: Diagnosis not present

## 2021-09-28 DIAGNOSIS — D7389 Other diseases of spleen: Secondary | ICD-10-CM | POA: Diagnosis not present

## 2021-09-28 DIAGNOSIS — Z79899 Other long term (current) drug therapy: Secondary | ICD-10-CM | POA: Insufficient documentation

## 2021-09-28 DIAGNOSIS — C541 Malignant neoplasm of endometrium: Secondary | ICD-10-CM | POA: Diagnosis present

## 2021-09-28 DIAGNOSIS — Z789 Other specified health status: Secondary | ICD-10-CM | POA: Diagnosis present

## 2021-09-28 DIAGNOSIS — Z7984 Long term (current) use of oral hypoglycemic drugs: Secondary | ICD-10-CM | POA: Insufficient documentation

## 2021-09-28 DIAGNOSIS — R319 Hematuria, unspecified: Secondary | ICD-10-CM

## 2021-09-28 DIAGNOSIS — I4891 Unspecified atrial fibrillation: Secondary | ICD-10-CM | POA: Diagnosis present

## 2021-09-28 DIAGNOSIS — E119 Type 2 diabetes mellitus without complications: Secondary | ICD-10-CM

## 2021-09-28 LAB — COMPREHENSIVE METABOLIC PANEL
ALT: 20 U/L (ref 0–44)
AST: 26 U/L (ref 15–41)
Albumin: 3.6 g/dL (ref 3.5–5.0)
Alkaline Phosphatase: 72 U/L (ref 38–126)
Anion gap: 7 (ref 5–15)
BUN: 29 mg/dL — ABNORMAL HIGH (ref 8–23)
CO2: 24 mmol/L (ref 22–32)
Calcium: 9.2 mg/dL (ref 8.9–10.3)
Chloride: 107 mmol/L (ref 98–111)
Creatinine, Ser: 1.21 mg/dL — ABNORMAL HIGH (ref 0.44–1.00)
GFR, Estimated: 43 mL/min — ABNORMAL LOW (ref >60)
Glucose, Bld: 155 mg/dL — ABNORMAL HIGH (ref 70–99)
Potassium: 4.6 mmol/L (ref 3.5–5.1)
Sodium: 138 mmol/L (ref 135–145)
Total Bilirubin: 0.6 mg/dL (ref 0.3–1.2)
Total Protein: 7.4 g/dL (ref 6.5–8.1)

## 2021-09-28 LAB — CBC WITH DIFFERENTIAL/PLATELET
Abs Immature Granulocytes: 0.04 10*3/uL (ref 0.00–0.07)
Basophils Absolute: 0 10*3/uL (ref 0.0–0.1)
Basophils Relative: 0 %
Eosinophils Absolute: 0.2 10*3/uL (ref 0.0–0.5)
Eosinophils Relative: 3 %
HCT: 32.2 % — ABNORMAL LOW (ref 36.0–46.0)
Hemoglobin: 10.5 g/dL — ABNORMAL LOW (ref 12.0–15.0)
Immature Granulocytes: 1 %
Lymphocytes Relative: 10 %
Lymphs Abs: 0.6 10*3/uL — ABNORMAL LOW (ref 0.7–4.0)
MCH: 29.8 pg (ref 26.0–34.0)
MCHC: 32.6 g/dL (ref 30.0–36.0)
MCV: 91.5 fL (ref 80.0–100.0)
Monocytes Absolute: 0.5 10*3/uL (ref 0.1–1.0)
Monocytes Relative: 8 %
Neutro Abs: 4.7 10*3/uL (ref 1.7–7.7)
Neutrophils Relative %: 78 %
Platelets: 168 10*3/uL (ref 150–400)
RBC: 3.52 MIL/uL — ABNORMAL LOW (ref 3.87–5.11)
RDW: 13.8 % (ref 11.5–15.5)
WBC: 6 10*3/uL (ref 4.0–10.5)
nRBC: 0 % (ref 0.0–0.2)

## 2021-09-28 LAB — GLUCOSE, CAPILLARY
Glucose-Capillary: 152 mg/dL — ABNORMAL HIGH (ref 70–99)
Glucose-Capillary: 131 mg/dL — ABNORMAL HIGH (ref 70–99)

## 2021-09-28 LAB — PROTIME-INR
INR: 4 — ABNORMAL HIGH (ref 0.8–1.2)
Prothrombin Time: 38.3 seconds — ABNORMAL HIGH (ref 11.4–15.2)

## 2021-09-28 LAB — URINALYSIS, ROUTINE W REFLEX MICROSCOPIC
Bacteria, UA: NONE SEEN
Bilirubin Urine: NEGATIVE
Glucose, UA: NEGATIVE mg/dL
Ketones, ur: NEGATIVE mg/dL
Nitrite: NEGATIVE
Protein, ur: >=300 mg/dL — AB
Specific Gravity, Urine: 1.017 (ref 1.005–1.030)
pH: 8 (ref 5.0–8.0)

## 2021-09-28 LAB — HEMOGLOBIN A1C
Hgb A1c MFr Bld: 5.7 % — ABNORMAL HIGH (ref 4.8–5.6)
Mean Plasma Glucose: 116.89 mg/dL

## 2021-09-28 LAB — TYPE AND SCREEN
ABO/RH(D): O POS
Antibody Screen: NEGATIVE

## 2021-09-28 LAB — PHOSPHORUS: Phosphorus: 3.7 mg/dL (ref 2.5–4.6)

## 2021-09-28 LAB — MAGNESIUM: Magnesium: 2 mg/dL (ref 1.7–2.4)

## 2021-09-28 MED ORDER — SODIUM CHLORIDE 0.9 % IV SOLN: INTRAVENOUS | Status: AC

## 2021-09-28 MED ORDER — SODIUM CHLORIDE 0.9 % IV SOLN: INTRAVENOUS | Status: DC

## 2021-09-28 MED ORDER — OMEGA-3-ACID ETHYL ESTERS 1 G PO CAPS
1.0000 g | ORAL_CAPSULE | Freq: Two times a day (BID) | ORAL | Status: DC
  Administered 2021-09-28 – 2021-10-02 (×8): 1 g via ORAL
  Filled 2021-09-28 (×8): qty 1

## 2021-09-28 MED ORDER — NYSTATIN 100000 UNIT/GM EX POWD
Freq: Two times a day (BID) | CUTANEOUS | Status: DC
Start: 2021-09-28 — End: 2021-10-02
  Filled 2021-09-28 (×2): qty 15

## 2021-09-28 MED ORDER — HEPARIN SODIUM (PORCINE) 5000 UNIT/ML IJ SOLN: 5000.0000 [IU] | Freq: Three times a day (TID) | INTRAMUSCULAR | Status: DC

## 2021-09-28 MED ORDER — ONDANSETRON HCL 4 MG/2ML IJ SOLN: 4.0000 mg | Freq: Four times a day (QID) | INTRAMUSCULAR | Status: DC | PRN

## 2021-09-28 MED ORDER — LEVOTHYROXINE SODIUM 25 MCG PO TABS
125.0000 ug | ORAL_TABLET | Freq: Every day | ORAL | Status: DC
  Administered 2021-09-29 – 2021-10-02 (×4): 125 ug via ORAL
  Filled 2021-09-28 (×4): qty 1

## 2021-09-28 MED ORDER — GLIPIZIDE 5 MG PO TABS
5.0000 mg | ORAL_TABLET | Freq: Every day | ORAL | Status: DC
  Administered 2021-09-29 – 2021-10-02 (×4): 5 mg via ORAL
  Filled 2021-09-28 (×4): qty 1

## 2021-09-28 MED ORDER — OXYCODONE HCL 5 MG PO TABS: 5.0000 mg | ORAL_TABLET | ORAL | Status: DC | PRN

## 2021-09-28 MED ORDER — PHYTONADIONE 5 MG PO TABS
2.5000 mg | ORAL_TABLET | Freq: Once | ORAL | Status: AC
Start: 2021-09-28 — End: 2021-09-28
  Administered 2021-09-28: 2.5 mg via ORAL
  Filled 2021-09-28: qty 1

## 2021-09-28 MED ORDER — INSULIN ASPART 100 UNIT/ML IJ SOLN
0.0000 [IU] | Freq: Three times a day (TID) | INTRAMUSCULAR | Status: DC
  Administered 2021-09-28: 1 [IU] via SUBCUTANEOUS

## 2021-09-28 MED ORDER — ACETAMINOPHEN 325 MG PO TABS: 650.0000 mg | ORAL_TABLET | Freq: Four times a day (QID) | ORAL | Status: DC | PRN

## 2021-09-28 MED ORDER — HYDRALAZINE HCL 20 MG/ML IJ SOLN: 10.0000 mg | INTRAMUSCULAR | Status: DC | PRN

## 2021-09-28 MED ORDER — MAGNESIUM OXIDE 400 MG PO TABS
400.0000 mg | ORAL_TABLET | Freq: Every day | ORAL | Status: DC
  Administered 2021-09-28 – 2021-10-02 (×5): 400 mg via ORAL
  Filled 2021-09-28 (×11): qty 1

## 2021-09-28 MED ORDER — SODIUM CHLORIDE 0.9 % IV BOLUS
500.0000 mL | Freq: Once | INTRAVENOUS | Status: AC
  Administered 2021-09-28: 500 mL via INTRAVENOUS

## 2021-09-28 MED ORDER — SODIUM CHLORIDE 0.9 % IV SOLN: 250.0000 mL | INTRAVENOUS | Status: DC | PRN

## 2021-09-28 MED ORDER — LEVALBUTEROL HCL 0.63 MG/3ML IN NEBU
0.6300 mg | INHALATION_SOLUTION | Freq: Four times a day (QID) | RESPIRATORY_TRACT | Status: DC | PRN
Start: 2021-09-28 — End: 2021-10-02

## 2021-09-28 MED ORDER — FLEET ENEMA 7-19 GM/118ML RE ENEM: 1.0000 | ENEMA | Freq: Once | RECTAL | Status: DC | PRN

## 2021-09-28 MED ORDER — FUROSEMIDE 20 MG PO TABS
20.0000 mg | ORAL_TABLET | Freq: Every day | ORAL | Status: DC
  Administered 2021-09-29 – 2021-10-02 (×4): 20 mg via ORAL
  Filled 2021-09-28 (×4): qty 1

## 2021-09-28 MED ORDER — TRAZODONE HCL 50 MG PO TABS
25.0000 mg | ORAL_TABLET | Freq: Every evening | ORAL | Status: DC | PRN
  Administered 2021-09-30 – 2021-10-01 (×2): 25 mg via ORAL
  Filled 2021-09-28 (×2): qty 1

## 2021-09-28 MED ORDER — SODIUM CHLORIDE 0.9% FLUSH
3.0000 mL | Freq: Two times a day (BID) | INTRAVENOUS | Status: DC
  Administered 2021-09-28 – 2021-09-30 (×5): 3 mL via INTRAVENOUS

## 2021-09-28 MED ORDER — IPRATROPIUM BROMIDE 0.02 % IN SOLN: 0.5000 mg | Freq: Four times a day (QID) | RESPIRATORY_TRACT | Status: DC | PRN

## 2021-09-28 MED ORDER — WARFARIN SODIUM 5 MG PO TABS: 5.0000 mg | ORAL_TABLET | Freq: Every day | ORAL | Status: DC

## 2021-09-28 MED ORDER — ONDANSETRON HCL 4 MG PO TABS: 4.0000 mg | ORAL_TABLET | Freq: Four times a day (QID) | ORAL | Status: DC | PRN

## 2021-09-28 MED ORDER — SODIUM CHLORIDE 0.9% FLUSH: 3.0000 mL | INTRAVENOUS | Status: DC | PRN

## 2021-09-28 MED ORDER — ENSURE ENLIVE PO LIQD
237.0000 mL | Freq: Two times a day (BID) | ORAL | Status: DC
  Administered 2021-09-29 – 2021-10-01 (×3): 237 mL via ORAL

## 2021-09-28 MED ORDER — HYDROMORPHONE HCL 1 MG/ML IJ SOLN: 0.5000 mg | INTRAMUSCULAR | Status: DC | PRN

## 2021-09-28 MED ORDER — ACETAMINOPHEN 650 MG RE SUPP: 650.0000 mg | Freq: Four times a day (QID) | RECTAL | Status: DC | PRN

## 2021-09-28 MED ORDER — SODIUM CHLORIDE 0.9 % IV SOLN
1.0000 g | INTRAVENOUS | Status: DC
  Administered 2021-09-29 – 2021-10-01 (×3): 1 g via INTRAVENOUS
  Filled 2021-09-28 (×3): qty 10

## 2021-09-28 MED ORDER — IOHEXOL 300 MG/ML  SOLN
80.0000 mL | Freq: Once | INTRAMUSCULAR | Status: AC | PRN
  Administered 2021-09-28: 80 mL via INTRAVENOUS

## 2021-09-28 MED ORDER — SENNOSIDES-DOCUSATE SODIUM 8.6-50 MG PO TABS: 1.0000 | ORAL_TABLET | Freq: Every evening | ORAL | Status: DC | PRN

## 2021-09-28 MED ORDER — ADULT MULTIVITAMIN W/MINERALS CH
1.0000 | ORAL_TABLET | Freq: Every day | ORAL | Status: DC
  Administered 2021-09-28 – 2021-10-02 (×5): 1 via ORAL
  Filled 2021-09-28 (×7): qty 1

## 2021-09-28 MED ORDER — DILTIAZEM HCL ER COATED BEADS 120 MG PO CP24
240.0000 mg | ORAL_CAPSULE | Freq: Every day | ORAL | Status: DC
Start: 2021-09-28 — End: 2021-10-02
  Administered 2021-09-28 – 2021-10-02 (×5): 240 mg via ORAL
  Filled 2021-09-28 (×5): qty 2

## 2021-09-28 MED ORDER — METOPROLOL SUCCINATE ER 50 MG PO TB24
75.0000 mg | ORAL_TABLET | Freq: Two times a day (BID) | ORAL | Status: DC
  Administered 2021-09-28 – 2021-10-02 (×8): 75 mg via ORAL
  Filled 2021-09-28 (×8): qty 1

## 2021-09-28 MED ORDER — BISACODYL 5 MG PO TBEC: 5.0000 mg | DELAYED_RELEASE_TABLET | Freq: Every day | ORAL | Status: DC | PRN

## 2021-09-28 MED ORDER — SODIUM CHLORIDE 0.9 % IV SOLN
1.0000 g | Freq: Once | INTRAVENOUS | Status: AC
  Administered 2021-09-28: 1 g via INTRAVENOUS
  Filled 2021-09-28: qty 10

## 2021-09-28 MED ORDER — CHOLECALCIFEROL 10 MCG (400 UNIT) PO TABS
400.0000 [IU] | ORAL_TABLET | Freq: Every day | ORAL | Status: DC
  Administered 2021-09-28 – 2021-10-02 (×5): 400 [IU] via ORAL
  Filled 2021-09-28 (×5): qty 1

## 2021-09-28 MED ORDER — SODIUM CHLORIDE 0.9% FLUSH
3.0000 mL | Freq: Two times a day (BID) | INTRAVENOUS | Status: DC
  Administered 2021-09-28 – 2021-10-02 (×6): 3 mL via INTRAVENOUS

## 2021-09-28 NOTE — Assessment & Plan Note (Signed)
Statin intolerance -Continuing alternative including fish oil

## 2021-09-28 NOTE — ED Provider Notes (Signed)
[facility] EMERGENCY DEPARTMENT Provider Note   CSN: [id number] Arrival date & time: 09/28/21  4098     History  Chief Complaint  Patient presents with   Vaginal Bleeding    [name] is a 88 y.o. female.  Patient had bleeding she was not sure from where she thought it was vaginal bleeding.  When she got up this morning had to go to the bathroom she went to the toilet was red blood but seem to be diluted.  She can tell if it was from her anal area posterior vaginal area or from her urine.  Patient arrived here with heart rate up a little bit at 101 but in no acute distress.  Patient's never had any bleeding from any of those places.  Patient's had a total abdominal hysterectomy in the past.  Patient is on Coumadin for atrial fibrillation.  Past medical history significant for endometrial cancer course now she has had the hysterectomy.  That was in 2020 type 2 diabetes atrial fibrillation hyperlipidemia hypertension and coronary artery disease multiple vessels CABG in 2010.  Past surgical history significant for appendectomy cholecystectomy and abdominal hysterectomy in 2012.  Complete due to endometrial cancer.       Home Medications Prior to Admission medications   Medication Sig Start Date End Date Taking? Authorizing Provider  Blood Glucose Monitoring Suppl (ONETOUCH VERIO REFLECT) w/Device KIT See admin instructions. 04/06/20   [provider]  Calcium Carbonate (CALCIUM 500 PO) Take 500 mg by mouth 2 (two) times daily.    [provider]  Cholecalciferol (VITAMIN D PO) Take 400 Units by mouth daily.    [provider]  denosumab (PROLIA) 60 MG/ML SOSY injection Inject 60 mg into the skin every 6 (six) months.    [provider]  diltiazem (CARDIZEM CD) 240 MG 24 hr capsule Take 240 mg by mouth daily.    [provider]  Diphenhydramine-APAP, sleep, (TYLENOL PM EXTRA STRENGTH PO) Take 1 tablet by mouth at bedtime as needed. Sleep     [provider]  furosemide (LASIX) 40 MG tablet Take 1 tablet (40 mg total) by mouth daily. 08/27/19   Catarina Hartshorn, MD  glipiZIDE (GLUCOTROL) 5 MG tablet Take 5 mg by mouth daily.    [provider]  Glucosamine 500 MG CAPS Take 500 mg by mouth 2 (two) times daily.      [provider]  HYDROcodone-acetaminophen (NORCO/VICODIN) 5-325 MG per tablet Take 1 tablet by mouth every 8 (eight) hours as needed for moderate pain.    [provider]  levothyroxine (SYNTHROID) 25 MCG tablet Take 25 mcg by mouth every morning. 08/02/20   [provider]  levothyroxine (SYNTHROID) 50 MCG tablet TAKE 1 TABLET BY MOUTH DAILY BEFORE BREAKFAST 10/23/20   Dani Gobble, NP  magnesium oxide (MAG-OX) 400 MG tablet Take 400 mg by mouth daily.    [provider]  metFORMIN (GLUCOPHAGE) 500 MG tablet Take 500 mg by mouth at bedtime. Once daily at bedtime    [provider]  metoprolol succinate (TOPROL-XL) 50 MG 24 hr tablet TAKE 1 AND 1/2 TABLETS BY MOUTH TWICE DAILY 08/01/19   Laqueta Linden, MD  Multiple Vitamin (MULTIVITAMIN) tablet Take 1 tablet by mouth daily.    [provider]  Omega-3 Fatty Acids (FISH OIL) 1200 MG CAPS Take 1,200 mg by mouth 2 (two) times daily.    [provider]  [facility] VERIO test strip SMARTSIG:Via Meter 3 Times  a Week 04/06/20   [provider]  warfarin (COUMADIN) 5 MG tablet Take 5 mg by mouth daily. 7.5mg  alt w/ 5mg  every other day PER [name]'S OFFICE.    [provider]      Allergies    Statins    Review of Systems   Review of Systems  Constitutional:  Negative for chills and fever.  HENT:  Negative for ear pain and sore throat.   Eyes:  Negative for pain and visual disturbance.  Respiratory:  Negative for cough and shortness of breath.   Cardiovascular:  Negative for chest pain and palpitations.  Gastrointestinal:  Negative for abdominal pain and vomiting.   Genitourinary:  Positive for vaginal bleeding. Negative for dysuria and hematuria.  Musculoskeletal:  Negative for arthralgias and back pain.  Skin:  Negative for color change and rash.  Neurological:  Negative for seizures and syncope.  All other systems reviewed and are negative.   Physical Exam Updated Vital Signs BP (!) 160/78 (BP Location: Left Arm)   Pulse 85   Temp 97.8 F (36.6 C) (Oral)   Resp 20   Ht 1.549 m (5\' 1" )   Wt 71.7 kg   SpO2 96%   BMI 29.85 kg/m  Physical Exam Vitals and nursing note reviewed.  Constitutional:      General: She is not in acute distress.    Appearance: Normal appearance. She is well-developed.  HENT:     Head: Normocephalic and atraumatic.  Eyes:     Extraocular Movements: Extraocular movements intact.     Conjunctiva/sclera: Conjunctivae normal.     Pupils: Pupils are equal, round, and reactive to light.  Cardiovascular:     Rate and Rhythm: Normal rate and regular rhythm.     Heart sounds: No murmur heard. Pulmonary:     Effort: Pulmonary effort is normal. No respiratory distress.     Breath sounds: Normal breath sounds.  Abdominal:     Palpations: Abdomen is soft.     Tenderness: There is no abdominal tenderness.  Genitourinary:    Comments: Rectum stool brown no bleeding from there.  Hemoccult not done because it would have been cross contaminated.  Vaginal vault without any bleeding deep.  Bleeding seems to be coming from the urine.  This was eventually verified with the wick.  Plus her urinalysis was bloody. Musculoskeletal:        General: No swelling.     Cervical back: Normal range of motion and neck supple.  Skin:    General: Skin is warm and dry.     Capillary Refill: Capillary refill takes less than 2 seconds.  Neurological:     General: No focal deficit present.     Mental Status: She is alert and oriented to person, place, and time.  Psychiatric:        Mood and Affect: Mood normal.     ED Results / Procedures  / Treatments   Labs (all labs ordered are listed, but only abnormal results are displayed) Labs Reviewed  CBC WITH DIFFERENTIAL/PLATELET - Abnormal; Notable for the following components:      Result Value   RBC 3.52 (*)    Hemoglobin 10.5 (*)    HCT 32.2 (*)    Lymphs Abs 0.6 (*)    All other components within normal limits  COMPREHENSIVE METABOLIC PANEL  PROTIME-INR  TYPE AND SCREEN    EKG None  Radiology No results found.  Procedures Procedures    Medications Ordered in ED  Medications - No data to display  ED Course/ Medical Decision Making/ A&P                           Medical Decision Making Amount and/or Complexity of Data Reviewed Labs: ordered. Radiology: ordered.  Risk Prescription drug management. Decision regarding hospitalization.   The blood is definitely coming from the urine.  Urinalysis though not classic for urinary tract infection but there must be something about the cell count as a mention no red blood cells.  Definitely the urine is grossly bloody.  CT scan suggestive of thickening of the bladder.  A little bit of distal hydroureter.  But no proximal hydroureter.  Based on this patient started on Rocephin.  Hemoglobin stable for her 10.5 no significant change platelets are good.  White count normal.  Complete metabolic panel creatinine is 1.21 for a GFR 43 so a little off from baseline.  Patient's Coumadin INR is 4.  So she coagulopathy.  Based on this felt patient needed admission for following H&H's and she did not take her Coumadin today and will hold the Coumadin think she needs to be reversed at this time she is hemodynamically stable.  Discussed with the hospitalist they will admit urine was sent for culture. Final Clinical Impression(s) / ED Diagnoses Final diagnoses:  None    Rx / DC Orders ED Discharge Orders     None         Vanetta Mulders, MD 09/28/21 1534

## 2021-09-28 NOTE — Assessment & Plan Note (Signed)
-   History of CABG 2010 with Dr. Donata Clay -Currently stable  -Resuming home meds

## 2021-09-28 NOTE — ED Triage Notes (Signed)
Pt woke up at 0200 and noticed bleeding - "filled up carpet, bathroom floor and toilet".  Pt states she she Warfarin for A-Fib.

## 2021-09-29 DIAGNOSIS — N3091 Cystitis, unspecified with hematuria: Secondary | ICD-10-CM | POA: Diagnosis not present

## 2021-09-29 LAB — BASIC METABOLIC PANEL
Anion gap: 6 (ref 5–15)
BUN: 25 mg/dL — ABNORMAL HIGH (ref 8–23)
CO2: 22 mmol/L (ref 22–32)
Calcium: 8.6 mg/dL — ABNORMAL LOW (ref 8.9–10.3)
Chloride: 108 mmol/L (ref 98–111)
Creatinine, Ser: 1 mg/dL (ref 0.44–1.00)
GFR, Estimated: 54 mL/min — ABNORMAL LOW (ref >60)
Glucose, Bld: 102 mg/dL — ABNORMAL HIGH (ref 70–99)
Potassium: 4 mmol/L (ref 3.5–5.1)
Sodium: 136 mmol/L (ref 135–145)

## 2021-09-29 LAB — GLUCOSE, CAPILLARY
Glucose-Capillary: 103 mg/dL — ABNORMAL HIGH (ref 70–99)
Glucose-Capillary: 91 mg/dL (ref 70–99)
Glucose-Capillary: 101 mg/dL — ABNORMAL HIGH (ref 70–99)
Glucose-Capillary: 99 mg/dL (ref 70–99)

## 2021-09-29 LAB — CBC
HCT: 28.5 % — ABNORMAL LOW (ref 36.0–46.0)
Hemoglobin: 9.3 g/dL — ABNORMAL LOW (ref 12.0–15.0)
MCH: 29.7 pg (ref 26.0–34.0)
MCHC: 32.6 g/dL (ref 30.0–36.0)
MCV: 91.1 fL (ref 80.0–100.0)
Platelets: 149 10*3/uL — ABNORMAL LOW (ref 150–400)
RBC: 3.13 MIL/uL — ABNORMAL LOW (ref 3.87–5.11)
RDW: 13.8 % (ref 11.5–15.5)
WBC: 4.6 10*3/uL (ref 4.0–10.5)
nRBC: 0 % (ref 0.0–0.2)

## 2021-09-29 LAB — PROTIME-INR
INR: 2.3 — ABNORMAL HIGH (ref 0.8–1.2)
Prothrombin Time: 25.1 seconds — ABNORMAL HIGH (ref 11.4–15.2)

## 2021-09-29 LAB — APTT: aPTT: 44 seconds — ABNORMAL HIGH (ref 24–36)

## 2021-09-29 LAB — MAGNESIUM: Magnesium: 1.9 mg/dL (ref 1.7–2.4)

## 2021-09-30 DIAGNOSIS — N3091 Cystitis, unspecified with hematuria: Secondary | ICD-10-CM | POA: Diagnosis not present

## 2021-09-30 LAB — URINE CULTURE

## 2021-09-30 LAB — BASIC METABOLIC PANEL
Anion gap: 10 (ref 5–15)
BUN: 22 mg/dL (ref 8–23)
CO2: 21 mmol/L — ABNORMAL LOW (ref 22–32)
Calcium: 8.5 mg/dL — ABNORMAL LOW (ref 8.9–10.3)
Chloride: 104 mmol/L (ref 98–111)
Creatinine, Ser: 1 mg/dL (ref 0.44–1.00)
GFR, Estimated: 54 mL/min — ABNORMAL LOW (ref >60)
Glucose, Bld: 105 mg/dL — ABNORMAL HIGH (ref 70–99)
Potassium: 3.7 mmol/L (ref 3.5–5.1)
Sodium: 135 mmol/L (ref 135–145)

## 2021-09-30 LAB — GLUCOSE, CAPILLARY
Glucose-Capillary: 111 mg/dL — ABNORMAL HIGH (ref 70–99)
Glucose-Capillary: 127 mg/dL — ABNORMAL HIGH (ref 70–99)
Glucose-Capillary: 87 mg/dL (ref 70–99)
Glucose-Capillary: 143 mg/dL — ABNORMAL HIGH (ref 70–99)

## 2021-09-30 LAB — CBC
HCT: 27.4 % — ABNORMAL LOW (ref 36.0–46.0)
Hemoglobin: 9.3 g/dL — ABNORMAL LOW (ref 12.0–15.0)
MCH: 30.7 pg (ref 26.0–34.0)
MCHC: 33.9 g/dL (ref 30.0–36.0)
MCV: 90.4 fL (ref 80.0–100.0)
Platelets: 138 10*3/uL — ABNORMAL LOW (ref 150–400)
RBC: 3.03 MIL/uL — ABNORMAL LOW (ref 3.87–5.11)
RDW: 13.9 % (ref 11.5–15.5)
WBC: 5.2 10*3/uL (ref 4.0–10.5)
nRBC: 0 % (ref 0.0–0.2)

## 2021-09-30 LAB — PROTIME-INR
INR: 1.4 — ABNORMAL HIGH (ref 0.8–1.2)
Prothrombin Time: 17 seconds — ABNORMAL HIGH (ref 11.4–15.2)

## 2021-09-30 NOTE — Evaluation (Signed)
Physical Therapy Evaluation Patient Details Name: [name] MRN: [id number] DOB: 31-Dec-1930 Today's Date: 09/30/2021  History of Present Illness  [name] is a 88 year old female with extensive history of A-fib on Coumadin, CAD with CABG, HTN, HLD (statin intolerant), DM 2, metria cancer (status post surgery) hypothyroidism presenting with bleeding.     Patient noted around 0200 today that she had blood tinged urine.  Initially she thought she was bleeding vaginally... Subsequently urination was clear that is in her urine.  No rectal bleeding.   Clinical Impression  Patient functioning near baseline for functional mobility and gait demonstrating good return for bed mobility, transfers and ambulation using RW without loss of balance.  Patient limited for gait training mostly due to fatigue and tolerated sitting up in chair after therapy.  Patient will benefit from continued skilled physical therapy in hospital and recommended venue below to increase strength, balance, endurance for safe ADLs and gait.         Recommendations for follow up therapy are one component of a multi-disciplinary discharge planning process, led by the attending physician.  Recommendations may be updated based on patient status, additional functional criteria and insurance authorization.  Follow Up Recommendations No PT follow up      Assistance Recommended at Discharge PRN  Patient can return home with the following  Help with stairs or ramp for entrance    Equipment Recommendations None recommended by PT  Recommendations for Other Services       Functional Status Assessment Patient has had a recent decline in their functional status and demonstrates the ability to make significant improvements in function in a reasonable and predictable amount of time.     Precautions / Restrictions Precautions Precautions: Fall Restrictions Weight Bearing Restrictions: No      Mobility  Bed  Mobility Overal bed mobility: Modified Independent             General bed mobility comments: slightly increased time    Transfers Overall transfer level: Needs assistance Equipment used: Rolling walker (2 wheels) Transfers: Sit to/from Stand, Bed to chair/wheelchair/BSC Sit to Stand: Supervision   Step pivot transfers: Supervision       General transfer comment: increased time having to use arm rest of chair during transfers without AD    Ambulation/Gait Ambulation/Gait assistance: Supervision Gait Distance (Feet): 55 Feet Assistive device: Rolling walker (2 wheels) Gait Pattern/deviations: Decreased step length - right, Decreased step length - left, Decreased stride length Gait velocity: decreased     General Gait Details: slightly labored cadence without loss of balance, limited mostly due to fatigue  Stairs            Wheelchair Mobility    Modified Rankin (Stroke Patients Only)       Balance Overall balance assessment: Needs assistance Sitting-balance support: Feet supported, No upper extremity supported Sitting balance-Leahy Scale: Good Sitting balance - Comments: seated at EOB   Standing balance support: During functional activity, No upper extremity supported Standing balance-Leahy Scale: Poor Standing balance comment: fair/poor without AD, fair/good using RW                             Pertinent Vitals/Pain Pain Assessment Pain Assessment: No/denies pain    Home Living Family/patient expects to be discharged to:: Private residence Living Arrangements: Alone Available Help at Discharge: Family;Available 24 hours/day Type of Home: House Home Access: Ramped entrance       Home  Layout: One level Home Equipment: Shower seat - built [employer] fundraiser (2 wheels)      Prior Function Prior Level of Function : Independent/Modified Independent             Mobility Comments: household ambulator without AD, uses RW for longer  distances ADLs Comments: assisted by family     Hand Dominance        Extremity/Trunk Assessment   Upper Extremity Assessment Upper Extremity Assessment: Overall WFL for tasks assessed    Lower Extremity Assessment Lower Extremity Assessment: Generalized weakness    Cervical / Trunk Assessment Cervical / Trunk Assessment: Normal  Communication   Communication: No difficulties  Cognition Arousal/Alertness: Awake/alert Behavior During Therapy: WFL for tasks assessed/performed Overall Cognitive Status: Within Functional Limits for tasks assessed                                          General Comments      Exercises     Assessment/Plan    PT Assessment Patient needs continued PT services  PT Problem List Decreased strength;Decreased activity tolerance;Decreased balance;Decreased mobility       PT Treatment Interventions DME instruction;Gait training;Stair training;Functional mobility training;Therapeutic activities;Therapeutic exercise;Patient/family education;Balance training    PT Goals (Current goals can be found in the Care Plan section)  Acute Rehab PT Goals Patient Stated Goal: return home with family to assist PT Goal Formulation: With patient Time For Goal Achievement: 10/03/21 Potential to Achieve Goals: Good    Frequency Min 3X/week     Co-evaluation               AM-PAC PT "6 Clicks" Mobility  Outcome Measure Help needed turning from your back to your side while in a flat bed without using bedrails?: None Help needed moving from lying on your back to sitting on the side of a flat bed without using bedrails?: None Help needed moving to and from a bed to a chair (including a wheelchair)?: None Help needed standing up from a chair using your arms (e.g., wheelchair or bedside chair)?: None Help needed to walk in hospital room?: A Little Help needed climbing 3-5 steps with a railing? : A Little 6 Click Score: 22    End of  Session   Activity Tolerance: Patient tolerated treatment well;Patient limited by fatigue Patient left: in chair;with call bell/phone within reach Nurse Communication: Mobility status PT Visit Diagnosis: Unsteadiness on feet (R26.81);Other abnormalities of gait and mobility (R26.89);Muscle weakness (generalized) (M62.81)    Time: 4696-2952 PT Time Calculation (min) (ACUTE ONLY): 21 min   Charges:   PT Evaluation $PT Eval Moderate Complexity: 1 Mod PT Treatments $Therapeutic Activity: 8-22 mins        12:03 PM, 09/30/21 Ocie Bob, MPT Physical Therapist with [facility] 336 [phone] office [phone] mobile phone

## 2021-09-30 NOTE — Progress Notes (Signed)
Patient has been stable this shift.  Urine is still red, but no clots noted this shift.  Patient has had urinary output.  No others complaints from patient.

## 2021-10-01 DIAGNOSIS — N3091 Cystitis, unspecified with hematuria: Secondary | ICD-10-CM | POA: Diagnosis not present

## 2021-10-01 LAB — BASIC METABOLIC PANEL
Anion gap: 7 (ref 5–15)
BUN: 20 mg/dL (ref 8–23)
CO2: 24 mmol/L (ref 22–32)
Calcium: 8.3 mg/dL — ABNORMAL LOW (ref 8.9–10.3)
Chloride: 105 mmol/L (ref 98–111)
Creatinine, Ser: 0.93 mg/dL (ref 0.44–1.00)
GFR, Estimated: 59 mL/min — ABNORMAL LOW (ref >60)
Glucose, Bld: 80 mg/dL (ref 70–99)
Potassium: 4 mmol/L (ref 3.5–5.1)
Sodium: 136 mmol/L (ref 135–145)

## 2021-10-01 LAB — GLUCOSE, CAPILLARY
Glucose-Capillary: 97 mg/dL (ref 70–99)
Glucose-Capillary: 122 mg/dL — ABNORMAL HIGH (ref 70–99)
Glucose-Capillary: 158 mg/dL — ABNORMAL HIGH (ref 70–99)

## 2021-10-01 LAB — CBC
HCT: 27.1 % — ABNORMAL LOW (ref 36.0–46.0)
Hemoglobin: 8.9 g/dL — ABNORMAL LOW (ref 12.0–15.0)
MCH: 30.2 pg (ref 26.0–34.0)
MCHC: 32.8 g/dL (ref 30.0–36.0)
MCV: 91.9 fL (ref 80.0–100.0)
Platelets: 150 10*3/uL (ref 150–400)
RBC: 2.95 MIL/uL — ABNORMAL LOW (ref 3.87–5.11)
RDW: 13.4 % (ref 11.5–15.5)
WBC: 4.3 10*3/uL (ref 4.0–10.5)
nRBC: 0 % (ref 0.0–0.2)

## 2021-10-01 LAB — PROTIME-INR
INR: 1.2 (ref 0.8–1.2)
Prothrombin Time: 15.5 seconds — ABNORMAL HIGH (ref 11.4–15.2)

## 2021-10-01 MED ORDER — INSULIN ASPART 100 UNIT/ML IJ SOLN: 0.0000 [IU] | Freq: Three times a day (TID) | INTRAMUSCULAR | Status: DC

## 2021-10-02 DIAGNOSIS — N3091 Cystitis, unspecified with hematuria: Secondary | ICD-10-CM | POA: Diagnosis not present

## 2021-10-02 LAB — BASIC METABOLIC PANEL
Anion gap: 7 (ref 5–15)
BUN: 25 mg/dL — ABNORMAL HIGH (ref 8–23)
CO2: 23 mmol/L (ref 22–32)
Calcium: 8.3 mg/dL — ABNORMAL LOW (ref 8.9–10.3)
Chloride: 106 mmol/L (ref 98–111)
Creatinine, Ser: 0.96 mg/dL (ref 0.44–1.00)
GFR, Estimated: 57 mL/min — ABNORMAL LOW (ref >60)
Glucose, Bld: 99 mg/dL (ref 70–99)
Potassium: 4 mmol/L (ref 3.5–5.1)
Sodium: 136 mmol/L (ref 135–145)

## 2021-10-02 LAB — CBC
HCT: 26.6 % — ABNORMAL LOW (ref 36.0–46.0)
Hemoglobin: 8.8 g/dL — ABNORMAL LOW (ref 12.0–15.0)
MCH: 29.9 pg (ref 26.0–34.0)
MCHC: 33.1 g/dL (ref 30.0–36.0)
MCV: 90.5 fL (ref 80.0–100.0)
Platelets: 141 10*3/uL — ABNORMAL LOW (ref 150–400)
RBC: 2.94 MIL/uL — ABNORMAL LOW (ref 3.87–5.11)
RDW: 13.4 % (ref 11.5–15.5)
WBC: 4.2 10*3/uL (ref 4.0–10.5)
nRBC: 0 % (ref 0.0–0.2)

## 2021-10-02 LAB — PROTIME-INR
INR: 1.2 (ref 0.8–1.2)
Prothrombin Time: 15.2 seconds (ref 11.4–15.2)

## 2021-10-02 LAB — GLUCOSE, CAPILLARY
Glucose-Capillary: 114 mg/dL — ABNORMAL HIGH (ref 70–99)
Glucose-Capillary: 144 mg/dL — ABNORMAL HIGH (ref 70–99)

## 2021-10-02 MED ORDER — ENOXAPARIN SODIUM 120 MG/0.8ML IJ SOSY: 120.0000 mg | PREFILLED_SYRINGE | INTRAMUSCULAR | 0 refills | Status: DC

## 2021-10-02 MED ORDER — ENSURE ENLIVE PO LIQD: 237.0000 mL | Freq: Two times a day (BID) | ORAL | 12 refills | Status: AC

## 2021-10-02 MED ORDER — FOSFOMYCIN TROMETHAMINE 3 G PO PACK
3.0000 g | PACK | Freq: Once | ORAL | Status: AC
Start: 2021-10-02 — End: 2021-10-02
  Administered 2021-10-02: 3 g via ORAL
  Filled 2021-10-02: qty 3

## 2021-10-02 MED ORDER — ENOXAPARIN SODIUM 120 MG/0.8ML IJ SOSY
120.0000 mg | PREFILLED_SYRINGE | INTRAMUSCULAR | Status: DC
Start: 2021-10-02 — End: 2021-10-02
  Administered 2021-10-02: 120 mg via SUBCUTANEOUS
  Filled 2021-10-02: qty 0.8

## 2021-10-02 NOTE — Progress Notes (Signed)
ANTICOAGULATION CONSULT NOTE - Initial Consult  Pharmacy Consult for once daily enoxaparin dosing Indication: atrial fibrillation  Allergies  Allergen Reactions   Statins Other (See Comments)    No energy, fatigue.     Patient Measurements: Height: _0  (154.9 cm) Weight: 79.2 kg (174 lb 9.7 oz) IBW/kg (Calculated) : 47.8   Vital Signs: Temp: 98 F (36.7 C) (06/28 0519) Temp Source: Oral (06/28 0519) BP: 140/69 (06/28 0519) Pulse Rate: 84 (06/28 0519)  Labs: Recent Labs    09/30/21 0000 10/01/21 0405 10/02/21 0500  HGB 9.3* 8.9* 8.8*  HCT 27.4* 27.1* 26.6*  PLT 138* 150 141*  LABPROT 17.0* 15.5* 15.2  INR 1.4* 1.2 1.2  CREATININE 1.00 0.93 0.96    Estimated Creatinine Clearance: 38.6 mL/min (by C-G formula based on SCr of 0.96 mg/dL). Lab Results  Component Value Date   CREATININE 0.96 10/02/2021   CREATININE 0.93 10/01/2021   CREATININE 1.00 09/30/2021   Filed Weights   09/30/21 0549 10/01/21 0500 10/02/21 0519  Weight: 81 kg (178 lb 9.2 oz) 79 kg (174 lb 3.2 oz) 79.2 kg (174 lb 9.7 oz)     Medical History: Past Medical History:  Diagnosis Date   Atrial fibrillation ([place])    Maze procedure and ligation of left atrial appendage 2010   Coronary artery disease    Multivessel status post CABG 2010   Diabetic retinopathy ([place])    Endometrial ca ([place]) 09/25/2010   June, 2012, radiation and chemotherapy - Dr. Tressie Stalker   Essential hypertension    Hiatal hernia    Hyperlipidemia    Hypertensive retinopathy    Osteoarthritis    Statin intolerance    Type 2 diabetes mellitus ([facility])     Medications:  Medications Prior to Admission  Medication Sig Dispense Refill Last Dose   Calcium Carbonate (CALCIUM 500 PO) Take 500 mg by mouth 2 (two) times daily.   09/27/2021   Cholecalciferol (VITAMIN D PO) Take 400 Units by mouth daily.   09/27/2021   denosumab (PROLIA) 60 MG/ML SOSY injection Inject 60 mg into the skin every 6 (six) months.   February 2023    diltiazem (CARDIZEM CD) 240 MG 24 hr capsule Take 240 mg by mouth daily.   09/27/2021   Diphenhydramine-APAP, sleep, (TYLENOL PM EXTRA STRENGTH PO) Take 1 tablet by mouth at bedtime as needed (sleep).   Past Month   furosemide (LASIX) 20 MG tablet Take 20 mg by mouth daily.   09/27/2021   glipiZIDE (GLUCOTROL) 5 MG tablet Take 5 mg by mouth daily.   09/27/2021   Glucosamine 500 MG CAPS Take 500 mg by mouth 2 (two) times daily.     09/27/2021   levothyroxine (SYNTHROID) 125 MCG tablet Take 125 mcg by mouth daily.   09/27/2021   magnesium oxide (MAG-OX) 400 MG tablet Take 400 mg by mouth daily.   09/27/2021   metFORMIN (GLUCOPHAGE) 500 MG tablet Take 500 mg by mouth at bedtime.   09/27/2021   metoprolol succinate (TOPROL-XL) 50 MG 24 hr tablet TAKE 1 AND 1/2 TABLETS BY MOUTH TWICE DAILY (Patient taking differently: Take 75 mg by mouth in the morning and at bedtime.) 270 tablet 2 09/27/2021 at 1800   Mirabegron (MYRBETRIQ PO) Take 1 tablet by mouth daily. Patient received samples from Dr. Joselyn Arrow office. Does not know the strength at this time.   09/27/2021   Multiple Vitamin (MULTIVITAMIN) tablet Take 1 tablet by mouth daily.   09/27/2021   Omega-3 Fatty Acids (FISH  OIL) 1200 MG CAPS Take 1,200 mg by mouth 2 (two) times daily.   09/27/2021   warfarin (COUMADIN) 5 MG tablet Take 5-7.5 mg by mouth daily. Take 5 mg on _0 /25/23 1300  cefTRIAXone (ROCEPHIN) 1 g in sodium chloride 0.9 % 100 mL IVPB  Status:  Discontinued        1 g 200 mL/hr over 30 Minutes Intravenous Every 24 hours 09/28/21 1428 10/02/21 0935   09/28/21 1300  cefTRIAXone (ROCEPHIN) 1 g in sodium chloride 0.9 % 100 mL IVPB        1 g 200 mL/hr over 30 Minutes Intravenous  Once 09/28/21 1259 09/28/21 1342       Assessment: [name] a 88 y.o. female requires anticoagulation with enoxaparin for the indication of  atrial fibrillation.   Goal of Therapy:  Patient to be properly anticoagulated with enoxaparin Monitor platelets by anticoagulation protocol: yes   Plan:  Enoxaparin 1.23m/kg (1239m subq q24h  CBC MWF Monitor for signs and symptoms of bleeding   GaThomasenia SalesPharmD Clinical Pharmacist 10/02/2021,10:31 AM

## 2021-10-02 NOTE — Discharge Summary (Signed)
Physician Discharge Summary  [name]:[id number] DOB: [birth year] DOA: 09/28/2021  PCP: Neale Burly, MD  Admit date: 09/28/2021  Discharge date: 10/02/2021  Admitted From:Home  Disposition:  Home  Recommendations for Outpatient Follow-up:  Follow up with PCP in 5-7 days and if no further bleeding noted while on Lovenox, may transition to oral Eliquis for anticoagulation.  Hold further use of Coumadin. Follow up with Urology with referral sent regarding hemorrhagic cystitis Remain on Lovenox daily for anticoagulation in the setting of atrial fibrillation as prescribed and if no further bleeding episodes noted in the next 1 week, may be transition to Eliquis Continue other home medications as noted below  Home Health: Yes with RN  Equipment/Devices: None  Discharge Condition:Stable  CODE STATUS: Full  Diet recommendation: Heart Healthy/carb modified  Brief/Interim Summary: [name] is a 88 year old female with extensive history of A-fib on Coumadin, CAD with CABG, HTN, HLD (statin intolerant), DM 2, endometrial cancer (status post surgery) hypothyroidism presented with hematuria in the setting of supratherapeutic INR.  She was admitted for evaluation of hemorrhagic cystitis and was taken off of her Coumadin due to elevated INR levels.  UA was not significant for UTI, however she was empirically started on IV Rocephin and no longer requires antibiotics.  She no longer has hematuria, however she has not started anticoagulation at present and therefore it would be safest to start her on Lovenox for now and ensure that she does not have any further hematuria episodes while on anticoagulation with this and if no further episodes are noted in the next 5-7 days and follow-up to PCP she may be transition to oral Eliquis.  She will also require urology follow-up in the outpatient setting as noted above.  No other acute events noted throughout the course of the stay.  Discharge  Diagnoses:  Principal Problem:   Hemorrhagic cystitis Active Problems:   Chronic anticoagulation   Hx of CABG   Coronary artery disease   Atrial fibrillation (HCC)   DM II (diabetes mellitus, type II), controlled (HCC)   HLD (hyperlipidemia)   Essential hypertension   Endometrial ca (HCC)   Statin intolerance   Hypothyroidism   Aortic stenosis  Principal discharge diagnosis: Hemorrhagic cystitis likely due to supratherapeutic INR in the setting of chronic anticoagulation with Coumadin for atrial fibrillation.  Discharge Instructions  Discharge Instructions     Ambulatory referral to Urology   Complete by: As directed    Diet - low sodium heart healthy   Complete by: As directed    Increase activity slowly   Complete by: As directed       Allergies as of 10/02/2021       Reactions   Statins Other (See Comments)   No energy, fatigue.         Medication List     STOP taking these medications    warfarin 5 MG tablet Commonly known as: COUMADIN       TAKE these medications    CALCIUM 500 PO Take 500 mg by mouth 2 (two) times daily.   denosumab 60 MG/ML Sosy injection Commonly known as: PROLIA Inject 60 mg into the skin every 6 (six) months.   diltiazem 240 MG 24 hr capsule Commonly known as: CARDIZEM CD Take 240 mg by mouth daily.   enoxaparin 120 MG/0.8ML injection Commonly known as: LOVENOX Inject 0.8 mLs (120 mg total) into the skin daily for 10 days.   feeding supplement Liqd Take 237 mLs by  mouth 2 (two) times daily between meals.   Fish Oil 1200 MG Caps Take 1,200 mg by mouth 2 (two) times daily.   furosemide 20 MG tablet Commonly known as: LASIX Take 20 mg by mouth daily.   glipiZIDE 5 MG tablet Commonly known as: GLUCOTROL Take 5 mg by mouth daily.   Glucosamine 500 MG Caps Take 500 mg by mouth 2 (two) times daily.   levothyroxine 125 MCG tablet Commonly known as: SYNTHROID Take 125 mcg by mouth daily.   magnesium oxide 400 MG  tablet Commonly known as: MAG-OX Take 400 mg by mouth daily.   metFORMIN 500 MG tablet Commonly known as: GLUCOPHAGE Take 500 mg by mouth at bedtime.   metoprolol succinate 50 MG 24 hr tablet Commonly known as: TOPROL-XL TAKE 1 AND 1/2 TABLETS BY MOUTH TWICE DAILY What changed: when to take this   multivitamin tablet Take 1 tablet by mouth daily.   MYRBETRIQ PO Take 1 tablet by mouth daily. Patient received samples from Dr. Joselyn Arrow office. Does not know the strength at this time.   OneTouch Verio Reflect w/Device Kit See admin instructions.   OneTouch Verio test strip Generic drug: glucose blood SMARTSIG:Via Meter 3 Times a Week   TYLENOL PM EXTRA STRENGTH PO Take 1 tablet by mouth at bedtime as needed (sleep).   VITAMIN D PO Take 400 Units by mouth daily.        Follow-up Information     Neale Burly, MD .   Specialty: Internal Medicine Contact information: [place] [address] [phone]         [facility] [place]. Go to.   Specialty: Urology Contact information: [place] [address] [phone]               Allergies  Allergen Reactions   Statins Other (See Comments)    No energy, fatigue.     Consultations: None   Procedures/Studies: CT Abdomen Pelvis W Contrast  Result Date: 09/28/2021 CLINICAL DATA:  UTI.  Bleeding. EXAM: CT ABDOMEN AND PELVIS WITH CONTRAST TECHNIQUE: Multidetector CT imaging of the abdomen and pelvis was performed using the standard protocol following bolus administration of intravenous contrast. RADIATION DOSE REDUCTION: This exam was performed according to the departmental dose-optimization program which includes automated exposure control, adjustment of the mA and/or kV according to patient size and/or use of iterative reconstruction technique. CONTRAST:  57m OMNIPAQUE IOHEXOL 300 MG/ML  SOLN COMPARISON:  11/11/2012 FINDINGS: Lower  chest: Moderate hiatal hernia, sliding. Coronary atherosclerosis and cardiomegaly. Mild interlobular septal thickening at the bases. Hepatobiliary: No focal liver abnormality.Cholecystectomy. No bile duct dilatation. Pancreas: Generalized atrophy. Spleen: Small low densities scattered in the spleen, measuring up to 15 mm in the subcapsular region. Adrenals/Urinary Tract: Negative adrenals. Bilateral hydroureter to the level of the pelvis . The bladder is thick walled with layering high-density. There is a small high-density focus in the lumen of the left paramedian bladder which is indeterminate between enhancement or stone. Stomach/Bowel:  No obstruction. No visible bowel inflammation. Vascular/Lymphatic: Atherosclerosis with severe calcific deposition in the aorta and iliacs. No mass or adenopathy. Reproductive:Hysterectomy. Other: No ascites or pneumoperitoneum. Musculoskeletal: Chronic bilateral obturator ring, sacral ala/body, and iliac fractures with hypertrophic and attempted hypertrophic healing. The symphysis pubis is diastasis from the degree of displaced obturator ring fractures. Severe lumbar spine degeneration with L4-5 anterolisthesis. Remote T12 compression fracture. IMPRESSION: 1. High-density in the bladder lumen correlating with history of  urinary hemorrhage. The bladder is thick walled with indistinctness suggesting underlying cystitis. A small high-density in the posterior left bladder could be an enhancing focus or stone. Bilateral hydroureter. 2. Multiple small splenic lesions, usually benign and incidental but not seen in 2014 or described on a 2017 report. These are indeterminate, please ensure no clinical signs of active metastatic disease or lymphoma. Consider follow-up. 3. Chronic insufficiency fractures affecting the pelvis with extensive sclerosis and pelvic deformity. Prior pelvic radiation may contribute to this appearance. 4. Interstitial pulmonary edema at the bases. 5. Moderate  hiatal hernia. Electronically Signed   By: Jorje Guild M.D.   On: 09/28/2021 11:22     Discharge Exam: Vitals:   10/01/21 2029 10/02/21 0519  BP: (!) 141/54 140/69  Pulse: 69 84  Resp: 20 20  Temp: 98.2 F (36.8 C) 98 F (36.7 C)  SpO2: 100% 92%   Vitals:   10/01/21 1341 10/01/21 1650 10/01/21 2029 10/02/21 0519  BP: (!) 114/53 (!) 141/58 (!) 141/54 140/69  Pulse: (!) 47 (!) 53 69 84  Resp: _0 Temp: 98 F (36.7 C)  98.2 F (36.8 C) 98 F (36.7 C)  TempSrc: Oral  Oral Oral  SpO2: 93%  100% 92%  Weight:    79.2 kg  Height:        General: Pt is alert, awake, not in acute distress Cardiovascular: RRR, S1/S2 +, no rubs, no gallops Respiratory: CTA bilaterally, no wheezing, no rhonchi Abdominal: Soft, NT, ND, bowel sounds + Extremities: no edema, no cyanosis    The results of significant diagnostics from this hospitalization (including imaging, microbiology, ancillary and laboratory) are listed below for reference.     Microbiology: Recent Results (from the past 240 hour(s))  Urine Culture     Status: Abnormal   Collection Time: 09/28/21  9:41 AM   Specimen: Urine, Clean Catch  Result Value Ref Range Status   Specimen Description   Final    URINE, CLEAN CATCH Performed at [facility], [address], [place], [place] [address]    Special Requests   Final    NONE Performed at [facility], [address] [place], [place] [address]    Culture MULTIPLE SPECIES PRESENT, SUGGEST RECOLLECTION (A)  Final   Report Status 09/30/2021 FINAL  Final  Culture, blood (Routine X 2) w Reflex to ID Panel     Status: None (Preliminary result)   Collection Time: 09/30/21 12:00 AM   Specimen: BLOOD LEFT HAND  Result Value Ref Range Status   Specimen Description BLOOD LEFT HAND  Final   Special Requests   Final    BOTTLES DRAWN AEROBIC AND ANAEROBIC Blood Culture adequate volume   Culture   Final    NO GROWTH 2 DAYS Performed at [facility], [address],  [place],  [address]    Report Status PENDING  Incomplete  Culture, blood (Routine X 2) w Reflex to ID Panel     Status: None (Preliminary result)   Collection Time: 09/30/21 12:15 AM   Specimen: Left Antecubital; Blood  Result Value Ref Range Status   Specimen Description LEFT ANTECUBITAL  Final   Special Requests   Final    BOTTLES DRAWN AEROBIC AND ANAEROBIC Blood Culture adequate volume   Culture   Final    NO GROWTH 2 DAYS Performed at [facility], [address] [place],  [address]    Report Status PENDING  Incomplete     Labs: BNP (last  3 results) No results for input(s): "BNP" in the last 8760 hours. Basic Metabolic Panel: Recent Labs  Lab 09/28/21 0651 09/28/21 1424 09/29/21 0425 09/29/21 1351 09/30/21 0000 10/01/21 0405 10/02/21 0500  NA 138  --  136  --  135 136 136  K 4.6  --  4.0  --  3.7 4.0 4.0  CL 107  --  108  --  104 105 106  CO2 24  --  22  --  21* 24 23  GLUCOSE 155*  --  102*  --  105* 80 99  BUN 29*  --  25*  --  22 20 25*  CREATININE 1.21*  --  1.00  --  1.00 0.93 0.96  CALCIUM 9.2  --  8.6*  --  8.5* 8.3* 8.3*  MG  --  2.0  --  1.9  --   --   --   PHOS  --  3.7  --   --   --   --   --    Liver Function Tests: Recent Labs  Lab 09/28/21 0651  AST 26  ALT 20  ALKPHOS 72  BILITOT 0.6  PROT 7.4  ALBUMIN 3.6   No results for input(s): "LIPASE", "AMYLASE" in the last 168 hours. No results for input(s): "AMMONIA" in the last 168 hours. CBC: Recent Labs  Lab 09/28/21 0651 09/29/21 0425 09/30/21 0000 10/01/21 0405 10/02/21 0500  WBC 6.0 4.6 5.2 4.3 4.2  NEUTROABS 4.7  --   --   --   --   HGB 10.5* 9.3* 9.3* 8.9* 8.8*  HCT 32.2* 28.5* 27.4* 27.1* 26.6*  MCV 91.5 91.1 90.4 91.9 90.5  PLT 168 149* 138* 150 141*   Cardiac Enzymes: No results for input(s): "CKTOTAL", "CKMB", "CKMBINDEX", "TROPONINI" in the last 168 hours. BNP: Invalid input(s): "POCBNP" CBG: Recent Labs  Lab 09/30/21 2044 10/01/21 0739 10/01/21 1610  10/01/21 2033 10/02/21 0745  GLUCAP 143* 97 122* 158* 114*   D-Dimer No results for input(s): "DDIMER" in the last 72 hours. Hgb A1c No results for input(s): "HGBA1C" in the last 72 hours. Lipid Profile No results for input(s): "CHOL", "HDL", "LDLCALC", "TRIG", "CHOLHDL", "LDLDIRECT" in the last 72 hours. Thyroid function studies No results for input(s): "TSH", "T4TOTAL", "T3FREE", "THYROIDAB" in the last 72 hours.  Invalid input(s): "FREET3" Anemia work up No results for input(s): "VITAMINB12", "FOLATE", "FERRITIN", "TIBC", "IRON", "RETICCTPCT" in the last 72 hours. Urinalysis    Component Value Date/Time   COLORURINE AMBER (A) 09/28/2021 0941   APPEARANCEUR CLOUDY (A) 09/28/2021 0941   LABSPEC 1.017 09/28/2021 0941   PHURINE 8.0 09/28/2021 0941   GLUCOSEU NEGATIVE 09/28/2021 0941   HGBUR MODERATE (A) 09/28/2021 0941   BILIRUBINUR NEGATIVE 09/28/2021 0941   KETONESUR NEGATIVE 09/28/2021 0941   PROTEINUR >=300 (A) 09/28/2021 0941   UROBILINOGEN 0.2 12/19/2008 2303   NITRITE NEGATIVE 09/28/2021 0941   LEUKOCYTESUR SMALL (A) 09/28/2021 0941   Sepsis Labs Recent Labs  Lab 09/29/21 0425 09/30/21 0000 10/01/21 0405 10/02/21 0500  WBC 4.6 5.2 4.3 4.2   Microbiology Recent Results (from the past 240 hour(s))  Urine Culture     Status: Abnormal   Collection Time: 09/28/21  9:41 AM   Specimen: Urine, Clean Catch  Result Value Ref Range Status   Specimen Description   Final    URINE, CLEAN CATCH Performed at [facility], [address], [place], [place] [address]    Special Requests   Final    NONE Performed  at [facility], [address], [place], [place] [address]    Culture MULTIPLE SPECIES PRESENT, SUGGEST RECOLLECTION (A)  Final   Report Status 09/30/2021 FINAL  Final  Culture, blood (Routine X 2) w Reflex to ID Panel     Status: None (Preliminary result)   Collection Time: 09/30/21 12:00 AM   Specimen: BLOOD LEFT HAND  Result Value Ref Range Status   Specimen  Description BLOOD LEFT HAND  Final   Special Requests   Final    BOTTLES DRAWN AEROBIC AND ANAEROBIC Blood Culture adequate volume   Culture   Final    NO GROWTH 2 DAYS Performed at [facility], [address], [place], [place] [address]    Report Status PENDING  Incomplete  Culture, blood (Routine X 2) w Reflex to ID Panel     Status: None (Preliminary result)   Collection Time: 09/30/21 12:15 AM   Specimen: Left Antecubital; Blood  Result Value Ref Range Status   Specimen Description LEFT ANTECUBITAL  Final   Special Requests   Final    BOTTLES DRAWN AEROBIC AND ANAEROBIC Blood Culture adequate volume   Culture   Final    NO GROWTH 2 DAYS Performed at [facility], [address], [place], [place] [address]    Report Status PENDING  Incomplete     Time coordinating discharge: 35 minutes  SIGNED:   Rodena Goldmann, DO [facility] 10/02/2021, 10:46 AM  If 7PM-7AM, please contact night-coverage [email or web address]

## 2021-10-02 NOTE — TOC Transition Note (Signed)
Transition of Care [facility]) - CM/SW Discharge Note   Patient Details  Name: [name] MRN: [id number] Date of Birth: [birth year]  [facility] CM/SW Contact:  Boneta Lucks, RN Phone Number: 10/02/2021, 11:08 AM   Clinical Narrative:   Patient discharging home. States she walks with a walker and independent doing well living alone. MD ordered HHR, Patient is agreeable to have RN visit. Vaughan Basta with [facility] accepted the referral.  Final next level of care: [place] Barriers to Discharge: Barriers Resolved  Patient Goals and CMS Choice Patient states their goals for this hospitalization and ongoing recovery are:: to go home. CMS Medicare.gov Compare Post Acute Care list provided to:: Patient Choice offered to / list presented to : Patient  Discharge Placement    Patient and family notified of of transfer: 10/02/21  Discharge Plan and Services    HH Arranged: RN [facility] Agency: [place] ([place]) Date [place]: 10/02/21 Time [place]: 1107 Representative spoke with at [place]: Romualdo Bolk  Readmission Risk Interventions     No data to display

## 2021-10-02 NOTE — Progress Notes (Signed)
[name] to be D/C'd home per MD order. Discussed with the patient and all questions fully answered.  Skin clean and dry. IV catheter discontinued intact. Site without signs and symptoms of complications. Dressing and pressure applied.  An After Visit Summary was printed and given to the patient. Patient able to demonstrate self administration of Lovenox injections and has a niece that can help if needed. Patient escorted via [place], and D/C home via private auto.  [name]  10/02/2021 1:21 PM

## 2021-10-04 DIAGNOSIS — I350 Nonrheumatic aortic (valve) stenosis: Secondary | ICD-10-CM | POA: Diagnosis not present

## 2021-10-04 DIAGNOSIS — Z7901 Long term (current) use of anticoagulants: Secondary | ICD-10-CM | POA: Diagnosis not present

## 2021-10-04 DIAGNOSIS — I10 Essential (primary) hypertension: Secondary | ICD-10-CM | POA: Diagnosis not present

## 2021-10-04 DIAGNOSIS — E039 Hypothyroidism, unspecified: Secondary | ICD-10-CM | POA: Diagnosis not present

## 2021-10-04 DIAGNOSIS — H35039 Hypertensive retinopathy, unspecified eye: Secondary | ICD-10-CM | POA: Diagnosis not present

## 2021-10-04 DIAGNOSIS — E785 Hyperlipidemia, unspecified: Secondary | ICD-10-CM | POA: Diagnosis not present

## 2021-10-04 DIAGNOSIS — K449 Diaphragmatic hernia without obstruction or gangrene: Secondary | ICD-10-CM | POA: Diagnosis not present

## 2021-10-04 DIAGNOSIS — N3091 Cystitis, unspecified with hematuria: Secondary | ICD-10-CM | POA: Diagnosis not present

## 2021-10-04 DIAGNOSIS — M1990 Unspecified osteoarthritis, unspecified site: Secondary | ICD-10-CM | POA: Diagnosis not present

## 2021-10-04 DIAGNOSIS — I4891 Unspecified atrial fibrillation: Secondary | ICD-10-CM | POA: Diagnosis not present

## 2021-10-04 DIAGNOSIS — E11319 Type 2 diabetes mellitus with unspecified diabetic retinopathy without macular edema: Secondary | ICD-10-CM | POA: Diagnosis not present

## 2021-10-04 DIAGNOSIS — Z8542 Personal history of malignant neoplasm of other parts of uterus: Secondary | ICD-10-CM | POA: Diagnosis not present

## 2021-10-04 DIAGNOSIS — Z7984 Long term (current) use of oral hypoglycemic drugs: Secondary | ICD-10-CM | POA: Diagnosis not present

## 2021-10-04 DIAGNOSIS — I2510 Atherosclerotic heart disease of native coronary artery without angina pectoris: Secondary | ICD-10-CM | POA: Diagnosis not present

## 2021-10-05 LAB — CULTURE, BLOOD (ROUTINE X 2)
Culture: NO GROWTH
Culture: NO GROWTH
Special Requests: ADEQUATE
Special Requests: ADEQUATE

## 2021-10-11 ENCOUNTER — Ambulatory Visit: Payer: Medicare Other | Admitting: Urology

## 2021-10-11 VITALS — BP 114/46 | HR 60

## 2021-10-11 DIAGNOSIS — N3091 Cystitis, unspecified with hematuria: Secondary | ICD-10-CM | POA: Diagnosis not present

## 2021-10-11 LAB — BLADDER SCAN AMB NON-IMAGING: Scan Result: 42

## 2021-10-11 NOTE — Patient Instructions (Signed)
Hemorrhagic Cystitis Hemorrhagic cystitis is bleeding from damage to the inner lining of the bladder. This condition results when the inner lining of the bladder (transitional epithelium) is damaged along with the blood vessels that supply the area. Hemorrhagic cystitis may make it difficult or painful to pass urine. What are the causes? This condition may be caused by: Damage from certain cancer treatments. This is the most common cause. It can result from: Radiation treatment that involves the bladder. Chemotherapy drugs used to treat certain cancers or to treat people who have a bone marrow transplant (cyclophosphamide and ifosfamide). Infections with bacteria or viruses, especially in people with a weak body defense system (immune system). Rare causes of the condition include: Other drugs, including penicillin drugs and a type of steroid (danazol). Exposure to toxic chemicals used in dyes, markers, shoe polish, or pesticides. What are the signs or symptoms? The main sign of this condition is blood in the urine (hematuria). This can range from very mild to severe. Mild hematuria can include microscopic bleeding that does not change the color of your urine. Severe hematuria can cause you to have urine with bright red blood or blood clots in it. In some cases, severe hematuria can cause large clots that fill the bladder and block urine flow (urinary obstruction). Hemorrhagic cystitis may also cause symptoms such as: An urgent or frequent need to pass urine. Pain when passing urine. Lower belly pain and fullness. Urinary obstruction. How is this diagnosed? This condition may be diagnosed based on: Your symptoms and medical history. A physical exam. Tests, such as: Urine tests to check for blood or signs of infection. Blood tests to check for signs of infection and a low red blood cell count due to bleeding. Imaging tests of the bladder, such as ultrasound, CT scan, or MRI. A procedure to  examine the inside of your bladder using a flexible scope (cystoscopy). How is this treated? Treatment depends on the cause of the condition and how severe the bleeding is. If you are being treated with chemotherapy drugs, you may be given other medicines to reduce the risk for this condition during treatment. Other treatments may include: Removing your exposure to substances that are causing the condition, such as a chemical toxin or a medicine. Taking antibiotic or antiviral medicine if the condition is caused by an infection. You may also be treated for hematuria. This can include: Fluids (hydration), bed rest, and observation. These methods may be all that is needed if you are able to pass urine and have no blood clots. Placing a flexible tube (catheter) into the bladder to continuously flush out (irrigate) the bladder with sterile saline solution. This may be needed if clots are passing or if bleeding is continuing. Medicine to reduce bleeding. A cystoscopy to remove clots if clots are filling the bladder. This may also include a procedure to stop bleeding (coagulation). A transfusion to replace blood loss. You may need surgery to stop bleeding or to remove the bladder if other treatments have not helped. Follow these instructions at home:  If you had surgery, your health care provider will give you instructions for taking care of yourself at home after your procedure. Follow these instructions carefully. Take over-the-counter and prescription medicines only as told by your health care provider. If you were prescribed an antibiotic medicine, take it as told by your health care provider. Do not stop using the antibiotic even if you start to feel better. Return to your normal activities as told  by your health care provider. Ask your health care provider what activities are safe for you. Drink enough fluid to keep your urine pale yellow. Keep all follow-up visits as told by your health care  provider. This is important. Contact a health care provider if you have: Chills or a fever. Blood in your urine. An urgent or frequent need to pass urine. Pain when passing urine. Get help right away if you: Have bright red blood or clots in your urine. Are unable to pass urine. Summary Hemorrhagic cystitis is bleeding caused by damage to the inner lining of your bladder. Hemorrhagic cystitis may make it difficult or painful to pass urine. This condition may be caused by damage from infections, radiation therapy, or chemotherapy drugs. Blood in the urine (hematuria) is the main sign of hemorrhagic cystitis. Hematuria can be very mild and involve microscopic bleeding that does not change the color of urine. It can also be severe and include passing urine with bright red blood or blood clots in it. Treatment for hemorrhagic cystitis depends on the cause and severity of the condition. In most cases, the condition will clear up with supportive care that may include rest, fluids, and antibiotics, along with removing exposure to the cause. Other treatments may be needed in more serious cases. This information is not intended to replace advice given to you by your health care provider. Make sure you discuss any questions you have with your health care provider. Document Revised: 02/07/2021 Document Reviewed: 02/07/2021 Elsevier Patient Education  [place].

## 2021-10-11 NOTE — Progress Notes (Unsigned)
10/11/2021 10:46 AM   [name] 1930-08-30 [id number]  Referring provider: Heath Lark D, DO [address] [place],  [place] [address]  hematuria   HPI: [name] is a 88yo here for evaluation of hematuria and UTI. She was admitted 09/28/2021 with hemorrhagic cystitis. A the time her INR was 4 and she had a UTI. Since her INR has normalized and she has complete antibiotic therapy her hematuria resolved. NO history of nephrolithiasis. Ct 09/28/2021 showed a thickened bladder wall with stranding consistent with cystitis. NO other complaints today   PMH: Past Medical History:  Diagnosis Date   Atrial fibrillation ([place])    Maze procedure and ligation of left atrial appendage 2010   Coronary artery disease    Multivessel status post CABG 2010   Diabetic retinopathy ([place])    Endometrial ca [facility]) 09/25/2010   June, 2012, radiation and chemotherapy - Dr. Tressie Stalker   Essential hypertension    Hiatal hernia    Hyperlipidemia    Hypertensive retinopathy    Osteoarthritis    Statin intolerance    Type 2 diabetes mellitus [facility])     Surgical History: Past Surgical History:  Procedure Laterality Date   ABDOMINAL HYSTERECTOMY  07/2010   complete, endometrial cancer   APPENDECTOMY  60 yrs ago   BREAST MASS EXCISION     left breast [place] Bilateral 2006   Dr. Iona Hansen   choleycystectomy  1998   COLONOSCOPY     several in [place], last one by Dr. Rowe Pavy, polyps once in [place]. records requested.   COLONOSCOPY  06/2010   Dr. Rodrigo Ran good. Moderate diverticulosis in left colon. Inflammation found in descending colon, scope could not be advanced forward.  ACBE-->sigmoid diverticulosis no worrisome findings.    CORONARY ARTERY BYPASS GRAFT  12/21/2008   [place] Gum; LIMA LAD, SVG posterior descending, SVG OM, maze procedure, ligation left atrial appendage and MAZE procedure for A.fib   ESOPHAGOGASTRODUODENOSCOPY   03/08/2012   [id number] ring was found at the gastroesophageal junction/Nodular gastritis (inflammation)/duodenal mucosa showed no abnormalities    EYE SURGERY     PORT-A-CATH REMOVAL  01/23/2012   Procedure: REMOVAL PORT-A-CATH;  Surgeon: Jamesetta So, MD;  Location: [facility];  Service: General;  Laterality: N/A;  Minor Room   PORTACATH PLACEMENT     TONSILECTOMY, ADENOIDECTOMY, BILATERAL MYRINGOTOMY AND TUBES     TUMOR REMOVAL     from throat  benign    Home Medications:  Allergies as of 10/11/2021       Reactions   Statins Other (See Comments)   No energy, fatigue.         Medication List        Accurate as of October 11, 2021 10:46 AM. If you have any questions, ask your nurse or doctor.          CALCIUM 500 PO Take 500 mg by mouth 2 (two) times daily.   denosumab 60 MG/ML Sosy injection Commonly known as: PROLIA Inject 60 mg into the skin every 6 (six) months.   diltiazem 240 MG 24 hr capsule Commonly known as: CARDIZEM CD Take 240 mg by mouth daily.   enoxaparin 120 MG/0.8ML injection Commonly known as: LOVENOX Inject 0.8 mLs (120 mg total) into the skin daily for 10 days.   feeding supplement Liqd Take 237 mLs by mouth 2 (two) times daily between meals.   Fish Oil 1200 MG Caps Take  1,200 mg by mouth 2 (two) times daily.   furosemide 20 MG tablet Commonly known as: LASIX Take 20 mg by mouth daily.   glipiZIDE 5 MG tablet Commonly known as: GLUCOTROL Take 5 mg by mouth daily.   Glucosamine 500 MG Caps Take 500 mg by mouth 2 (two) times daily.   levothyroxine 125 MCG tablet Commonly known as: SYNTHROID Take 125 mcg by mouth daily.   magnesium oxide 400 MG tablet Commonly known as: MAG-OX Take 400 mg by mouth daily.   metFORMIN 500 MG tablet Commonly known as: GLUCOPHAGE Take 500 mg by mouth at bedtime.   metoprolol succinate 50 MG 24 hr tablet Commonly known as: TOPROL-XL TAKE 1 AND 1/2 TABLETS BY MOUTH TWICE DAILY What changed: when to  take this   multivitamin tablet Take 1 tablet by mouth daily.   MYRBETRIQ PO Take 1 tablet by mouth daily. Patient received samples from Dr. Joselyn Arrow office. Does not know the strength at this time.   OneTouch Verio Reflect w/Device Kit See admin instructions.   OneTouch Verio test strip Generic drug: glucose blood SMARTSIG:Via Meter 3 Times a Week   TYLENOL PM EXTRA STRENGTH PO Take 1 tablet by mouth at bedtime as needed (sleep).   VITAMIN D PO Take 400 Units by mouth daily.        Allergies:  Allergies  Allergen Reactions   Statins Other (See Comments)    No energy, fatigue.     Family History: Family History  Problem Relation Age of Onset   Hypertension Mother    Breast cancer Mother    Heart attack Mother        deceased, age 41   Cancer Mother    Diabetes Sister        deceased - 67, renal failure,    Heart attack Father    Cancer Father    Colon cancer Neg Hx    Ovarian cancer Neg Hx    Lung cancer Neg Hx    Liver disease Neg Hx     Social History:  reports that she has never smoked. She has never used smokeless tobacco. She reports that she does not drink alcohol and does not use drugs.  ROS: All other review of systems were reviewed and are negative except what is noted above in HPI  Physical Exam: BP (!) 114/46   Pulse 60   Constitutional:  Alert and oriented, No acute distress. HEENT: [place] AT, moist mucus membranes.  Trachea midline, no masses. Cardiovascular: No clubbing, cyanosis, or edema. Respiratory: Normal respiratory effort, no increased work of breathing. GI: Abdomen is soft, nontender, nondistended, no abdominal masses GU: No CVA tenderness.  Lymph: No cervical or inguinal lymphadenopathy. Skin: No rashes, bruises or suspicious lesions. Neurologic: Grossly intact, no focal deficits, moving all 4 extremities. Psychiatric: Normal mood and affect.  Laboratory Data: Lab Results  Component Value Date   WBC 4.2 10/02/2021   HGB 8.8  (L) 10/02/2021   HCT 26.6 (L) 10/02/2021   MCV 90.5 10/02/2021   PLT 141 (L) 10/02/2021    Lab Results  Component Value Date   CREATININE 0.96 10/02/2021    No results found for: "PSA"  No results found for: "TESTOSTERONE"  Lab Results  Component Value Date   HGBA1C 5.7 (H) 09/28/2021    Urinalysis    Component Value Date/Time   COLORURINE AMBER (A) 09/28/2021 0941   APPEARANCEUR CLOUDY (A) 09/28/2021 0941   LABSPEC 1.017 09/28/2021 0941   PHURINE 8.0  09/28/2021 0941   GLUCOSEU NEGATIVE 09/28/2021 0941   HGBUR MODERATE (A) 09/28/2021 0941   BILIRUBINUR NEGATIVE 09/28/2021 0941   KETONESUR NEGATIVE 09/28/2021 0941   PROTEINUR >=300 (A) 09/28/2021 0941   UROBILINOGEN 0.2 12/19/2008 2303   NITRITE NEGATIVE 09/28/2021 0941   LEUKOCYTESUR SMALL (A) 09/28/2021 0941    Lab Results  Component Value Date   BACTERIA NONE SEEN 09/28/2021    Pertinent Imaging: CT 09/28/2021: Images reviewed and discussed with the the patient  No results found for this or any previous visit.  Results for orders placed during the hospital encounter of 08/24/19  US Venous Img Lower Bilateral (DVT)  Narrative CLINICAL DATA:  Bilateral lower extremity edema and pain.  EXAM: Bilateral LOWER EXTREMITY VENOUS DOPPLER ULTRASOUND  TECHNIQUE: Gray-scale sonography with compression, as well as color and duplex ultrasound, were performed to evaluate the deep venous system(s) from the level of the common femoral vein through the popliteal and proximal calf veins.  COMPARISON:  July 02, 2011.  FINDINGS: VENOUS  Normal compressibility of the common femoral, superficial femoral, and popliteal veins, as well as the visualized calf veins. Visualized portions of profunda femoral vein and great saphenous vein unremarkable. No filling defects to suggest DVT on grayscale or color Doppler imaging. Doppler waveforms show normal direction of venous flow, normal respiratory plasticity and response  to augmentation.  Limited views of the contralateral common femoral vein are unremarkable.  OTHER  None.  Limitations: none  IMPRESSION: Negative.   Electronically Signed By: Marijo Conception M.D. On: 08/25/2019 13:20  No results found for this or any previous visit.  No results found for this or any previous visit.  No results found for this or any previous visit.  No results found for this or any previous visit.  No results found for this or any previous visit.  No results found for this or any previous visit.   Assessment & Plan:    1. Hemorrhagic cystitis -Likely related to suprathereputic INR and UTI. Urine is currently clear. I will see her back prn - Urinalysis, Routine w reflex microscopic - BLADDER SCAN AMB NON-IMAGING   No follow-ups on file.  Nicolette Bang, MD  [facility]

## 2021-10-15 ENCOUNTER — Encounter: Payer: Self-pay | Admitting: Urology

## 2021-10-15 DIAGNOSIS — D6832 Hemorrhagic disorder due to extrinsic circulating anticoagulants: Secondary | ICD-10-CM | POA: Diagnosis not present

## 2021-10-15 DIAGNOSIS — Z6832 Body mass index (BMI) 32.0-32.9, adult: Secondary | ICD-10-CM | POA: Diagnosis not present

## 2021-11-14 ENCOUNTER — Ambulatory Visit: Payer: Medicare Other | Admitting: Cardiology

## 2021-11-14 DIAGNOSIS — M810 Age-related osteoporosis without current pathological fracture: Secondary | ICD-10-CM | POA: Diagnosis not present

## 2021-11-21 DIAGNOSIS — E1121 Type 2 diabetes mellitus with diabetic nephropathy: Secondary | ICD-10-CM | POA: Diagnosis not present

## 2021-11-21 DIAGNOSIS — E782 Mixed hyperlipidemia: Secondary | ICD-10-CM | POA: Diagnosis not present

## 2021-11-21 DIAGNOSIS — I10 Essential (primary) hypertension: Secondary | ICD-10-CM | POA: Diagnosis not present

## 2021-11-21 DIAGNOSIS — I5022 Chronic systolic (congestive) heart failure: Secondary | ICD-10-CM | POA: Diagnosis not present

## 2021-12-10 DIAGNOSIS — R319 Hematuria, unspecified: Secondary | ICD-10-CM | POA: Diagnosis not present

## 2021-12-10 DIAGNOSIS — M25551 Pain in right hip: Secondary | ICD-10-CM | POA: Diagnosis not present

## 2021-12-10 DIAGNOSIS — E785 Hyperlipidemia, unspecified: Secondary | ICD-10-CM | POA: Diagnosis not present

## 2021-12-10 DIAGNOSIS — N1330 Unspecified hydronephrosis: Secondary | ICD-10-CM | POA: Diagnosis not present

## 2021-12-10 DIAGNOSIS — Z7982 Long term (current) use of aspirin: Secondary | ICD-10-CM | POA: Diagnosis not present

## 2021-12-10 DIAGNOSIS — E860 Dehydration: Secondary | ICD-10-CM | POA: Diagnosis not present

## 2021-12-10 DIAGNOSIS — R54 Age-related physical debility: Secondary | ICD-10-CM | POA: Diagnosis not present

## 2021-12-10 DIAGNOSIS — Z7984 Long term (current) use of oral hypoglycemic drugs: Secondary | ICD-10-CM | POA: Diagnosis not present

## 2021-12-10 DIAGNOSIS — M170 Bilateral primary osteoarthritis of knee: Secondary | ICD-10-CM | POA: Diagnosis not present

## 2021-12-10 DIAGNOSIS — K760 Fatty (change of) liver, not elsewhere classified: Secondary | ICD-10-CM | POA: Diagnosis not present

## 2021-12-10 DIAGNOSIS — E119 Type 2 diabetes mellitus without complications: Secondary | ICD-10-CM | POA: Diagnosis not present

## 2021-12-10 DIAGNOSIS — I2510 Atherosclerotic heart disease of native coronary artery without angina pectoris: Secondary | ICD-10-CM | POA: Diagnosis not present

## 2021-12-10 DIAGNOSIS — N390 Urinary tract infection, site not specified: Secondary | ICD-10-CM | POA: Diagnosis not present

## 2021-12-10 DIAGNOSIS — N3001 Acute cystitis with hematuria: Secondary | ICD-10-CM | POA: Diagnosis not present

## 2021-12-10 DIAGNOSIS — E871 Hypo-osmolality and hyponatremia: Secondary | ICD-10-CM | POA: Diagnosis not present

## 2021-12-10 DIAGNOSIS — I10 Essential (primary) hypertension: Secondary | ICD-10-CM | POA: Diagnosis not present

## 2021-12-10 DIAGNOSIS — Z7901 Long term (current) use of anticoagulants: Secondary | ICD-10-CM | POA: Diagnosis not present

## 2021-12-10 DIAGNOSIS — N3000 Acute cystitis without hematuria: Secondary | ICD-10-CM | POA: Diagnosis not present

## 2021-12-10 DIAGNOSIS — I482 Chronic atrial fibrillation, unspecified: Secondary | ICD-10-CM | POA: Diagnosis not present

## 2021-12-10 DIAGNOSIS — N179 Acute kidney failure, unspecified: Secondary | ICD-10-CM | POA: Diagnosis not present

## 2021-12-11 DIAGNOSIS — N3000 Acute cystitis without hematuria: Secondary | ICD-10-CM | POA: Diagnosis not present

## 2021-12-11 DIAGNOSIS — I482 Chronic atrial fibrillation, unspecified: Secondary | ICD-10-CM | POA: Diagnosis not present

## 2021-12-11 DIAGNOSIS — N179 Acute kidney failure, unspecified: Secondary | ICD-10-CM | POA: Diagnosis not present

## 2021-12-11 DIAGNOSIS — R54 Age-related physical debility: Secondary | ICD-10-CM | POA: Diagnosis not present

## 2021-12-13 ENCOUNTER — Ambulatory Visit: Payer: Medicare Other | Admitting: Urology

## 2021-12-26 DIAGNOSIS — N3000 Acute cystitis without hematuria: Secondary | ICD-10-CM | POA: Diagnosis not present

## 2021-12-26 DIAGNOSIS — E860 Dehydration: Secondary | ICD-10-CM | POA: Diagnosis not present

## 2021-12-26 DIAGNOSIS — Z6832 Body mass index (BMI) 32.0-32.9, adult: Secondary | ICD-10-CM | POA: Diagnosis not present

## 2022-01-09 ENCOUNTER — Encounter: Payer: Self-pay | Admitting: Cardiology

## 2022-01-09 ENCOUNTER — Ambulatory Visit: Payer: Medicare Other | Attending: Cardiology | Admitting: Cardiology

## 2022-01-09 VITALS — BP 144/98 | HR 69 | Ht 61.0 in | Wt 174.2 lb

## 2022-01-09 DIAGNOSIS — I25119 Atherosclerotic heart disease of native coronary artery with unspecified angina pectoris: Secondary | ICD-10-CM

## 2022-01-09 DIAGNOSIS — I4821 Permanent atrial fibrillation: Secondary | ICD-10-CM | POA: Diagnosis not present

## 2022-01-09 NOTE — Patient Instructions (Addendum)

## 2022-01-09 NOTE — Progress Notes (Signed)
Cardiology Office Note  Date: 01/09/2022   ID: Lindsay, Lee 18-Jul-1930, MRN [id number]  PCP:  Neale Burly, MD  Cardiologist:  Rozann Lesches, MD Electrophysiologist:  None   Chief Complaint  Patient presents with   Cardiac follow-up    History of Present Illness: ISELLA Lee is an 88 y.o. female last seen in April 2022.  She is here for a follow-up visit.  Reports occasional sense of palpitations, heart rate is controlled today on Toprol-XL alone (has been taken off Cardizem CD in the interim).  She is now on Eliquis, previously Coumadin with follow-up by Dr. Sherrie Sport.  She did have a hospitalization in June with severe anemia and hemorrhagic cystitis.  It was following this that transition and anticoagulation was made.  I reviewed the remainder of her medications.  She reports no angina.  Past Medical History:  Diagnosis Date   Atrial fibrillation [facility])    Maze procedure and ligation of left atrial appendage 2010   Coronary artery disease    Multivessel status post CABG 2010   Diabetic retinopathy [facility])    Endometrial ca [facility]) 09/25/2010   June, 2012, radiation and chemotherapy - Dr. Tressie Stalker   Essential hypertension    Hiatal hernia    Hyperlipidemia    Hypertensive retinopathy    Osteoarthritis    Statin intolerance    Type 2 diabetes mellitus ([place])     Past Surgical History:  Procedure Laterality Date   ABDOMINAL HYSTERECTOMY  07/2010   complete, endometrial cancer   APPENDECTOMY  60 yrs ago   BREAST MASS EXCISION     left breast [place] Bilateral 2006   Dr. Iona Hansen   choleycystectomy  1998   COLONOSCOPY     several in [place], last one by Dr. Rowe Pavy, polyps once in [place]. records requested.   COLONOSCOPY  06/2010   Dr. Rodrigo Ran good. Moderate diverticulosis in left colon. Inflammation found in descending colon, scope could not be advanced forward.  ACBE-->sigmoid diverticulosis no  worrisome findings.    CORONARY ARTERY BYPASS GRAFT  12/21/2008   [place] Gum; LIMA LAD, SVG posterior descending, SVG OM, maze procedure, ligation left atrial appendage and MAZE procedure for A.fib   ESOPHAGOGASTRODUODENOSCOPY  03/08/2012   [id number] ring was found at the gastroesophageal junction/Nodular gastritis (inflammation)/duodenal mucosa showed no abnormalities    EYE SURGERY     PORT-A-CATH REMOVAL  01/23/2012   Procedure: REMOVAL PORT-A-CATH;  Surgeon: Jamesetta So, MD;  Location: [facility];  Service: General;  Laterality: N/A;  Minor Room   PORTACATH PLACEMENT     TONSILECTOMY, ADENOIDECTOMY, BILATERAL MYRINGOTOMY AND TUBES     TUMOR REMOVAL     from throat  benign    Current Outpatient Medications  Medication Sig Dispense Refill   apixaban (ELIQUIS) 2.5 MG TABS tablet Take 2.5 mg by mouth 2 (two) times daily.     Blood Glucose Monitoring Suppl (ONETOUCH VERIO REFLECT) w/Device KIT See admin instructions.     Calcium Carbonate-Vitamin D (OSCAL 500 D-3 PO) Take 1,200 mg by mouth in the morning and at bedtime.     Cholecalciferol (VITAMIN D PO) Take 500 Units by mouth daily.     denosumab (PROLIA) 60 MG/ML SOSY injection Inject 60 mg into the skin every 6 (six) months.     Diphenhydramine-APAP, sleep, (TYLENOL PM EXTRA STRENGTH PO) Take 1 tablet by mouth at bedtime as needed (sleep).  feeding supplement (ENSURE ENLIVE / ENSURE PLUS) LIQD Take 237 mLs by mouth 2 (two) times daily between meals. 237 mL 12   furosemide (LASIX) 20 MG tablet Take 40 mg by mouth daily.     glipiZIDE (GLUCOTROL) 5 MG tablet Take 5 mg by mouth daily.     Glucosamine 500 MG CAPS Take 1,500 mg by mouth daily.     levothyroxine (SYNTHROID) 50 MCG tablet Take 50 mcg by mouth daily.     magnesium oxide (MAG-OX) 400 MG tablet Take 400 mg by mouth daily.     metFORMIN (GLUCOPHAGE) 500 MG tablet Take 500 mg by mouth at bedtime.     metoprolol succinate (TOPROL-XL) 50 MG 24 hr tablet TAKE 1 AND 1/2 TABLETS  BY MOUTH TWICE DAILY (Patient taking differently: Take 75 mg by mouth in the morning and at bedtime.) 270 tablet 2   Mirabegron (MYRBETRIQ PO) Take 50 mg by mouth daily. Patient received samples from Dr. Joselyn Arrow office. Does not know the strength at this time.     Multiple Vitamin (MULTIVITAMIN) tablet Take 1 tablet by mouth daily.     Omega-3 Fatty Acids (FISH OIL) 1200 MG CAPS Take 1,000 mg by mouth 2 (two) times daily.     ONETOUCH VERIO test strip SMARTSIG:Via Meter 3 Times a Week     traMADol (ULTRAM) 50 MG tablet Take 50 mg by mouth 2 (two) times daily.     No current facility-administered medications for this visit.   Allergies:  Statins   ROS: No syncope.  No recent falls, using a walker.  Physical Exam: VS:  BP (!) 144/98 (BP Location: Left Arm, Patient Position: Sitting, Cuff Size: Normal)   Pulse 69   Ht '5\' 1"'  (1.549 m)   Wt 174 lb 3.2 oz (79 kg)   SpO2 99%   BMI 32.91 kg/m , BMI Body mass index is 32.91 kg/m.  Wt Readings from Last 3 Encounters:  01/09/22 174 lb 3.2 oz (79 kg)  10/02/21 174 lb 9.7 oz (79.2 kg)  07/12/20 168 lb (76.2 kg)    General: Patient appears comfortable at rest. HEENT: Conjunctiva and lids normal. Neck: Supple, no elevated JVP or carotid bruits. Lungs: Clear to auscultation, nonlabored breathing at rest. Cardiac: Irregularly irregular, no S3, 2/6 systolic murmur. Extremities: Mild lower leg edema.  ECG:  An ECG dated 09/28/2021 was personally reviewed today and demonstrated:  Atrial fibrillation with rightward axis, nonspecific ST changes.  Recent Labwork: 09/28/2021: ALT 20; AST 26 09/29/2021: Magnesium 1.9 10/02/2021: BUN 25; Creatinine, Ser 0.96; Hemoglobin 8.8; Platelets 141; Potassium 4.0; Sodium 136  September 2023: Hemoglobin 9.9, platelets 239, potassium 4.2, BUN 49, creatinine 1.29  Other Studies Reviewed Today:  Echocardiogram 08/25/2019:  1. Left ventricular ejection fraction, by estimation, is 50 to 55%. The  left ventricle  has low normal function. The left ventricle has no regional  wall motion abnormalities. There is moderate left ventricular hypertrophy.  Left ventricular diastolic  parameters are indeterminate.   2. Right ventricular systolic function is moderately reduced. The right  ventricular size is severely enlarged. There is severely elevated  pulmonary artery systolic pressure.   3. Right atrial size was severely dilated.   4. The mitral valve is normal in structure. Trivial mitral valve  regurgitation. No evidence of mitral stenosis.   5. Tricuspid valve regurgitation is moderate.   6. The aortic valve is tricuspid. Aortic valve regurgitation is not  visualized. No aortic stenosis is present.   7. Severe pulmonary  HTN, PASP is 72 mmHg.   8. The inferior vena cava is dilated in size with <50% respiratory  variability, suggesting right atrial pressure of 15 mmHg.  Assessment and Plan:  1.  Permanent atrial fibrillation with CHA2DS2-VASc score of 6.  Heart rate adequately controlled today on Toprol-XL.  She is now on Eliquis for stroke prophylaxis.  Following lab work with PCP.  No reported recurrent bleeding problems.  2.  Multivessel CAD status post CABG in 2010.  She does not describe any angina at this time.  Continue omega-3 supplements.  She has a history of multistatin intolerance.  Medication Adjustments/Labs and Tests Ordered: Current medicines are reviewed at length with the patient today.  Concerns regarding medicines are outlined above.   Tests Ordered: No orders of the defined types were placed in this encounter.   Medication Changes: No orders of the defined types were placed in this encounter.   Disposition:  Follow up 6 months.  Signed, Satira Sark, MD, [facility] 01/09/2022 2:16 PM    [facility] [place], [place], [place] [address] Phone: [phone]; Fax: [phone]

## 2022-03-11 DIAGNOSIS — I5022 Chronic systolic (congestive) heart failure: Secondary | ICD-10-CM | POA: Diagnosis not present

## 2022-03-11 DIAGNOSIS — N3000 Acute cystitis without hematuria: Secondary | ICD-10-CM | POA: Diagnosis not present

## 2022-03-11 DIAGNOSIS — E860 Dehydration: Secondary | ICD-10-CM | POA: Diagnosis not present

## 2022-03-11 DIAGNOSIS — E1121 Type 2 diabetes mellitus with diabetic nephropathy: Secondary | ICD-10-CM | POA: Diagnosis not present

## 2022-03-11 DIAGNOSIS — I10 Essential (primary) hypertension: Secondary | ICD-10-CM | POA: Diagnosis not present

## 2022-03-11 DIAGNOSIS — R32 Unspecified urinary incontinence: Secondary | ICD-10-CM | POA: Diagnosis not present

## 2022-03-31 ENCOUNTER — Emergency Department (HOSPITAL_COMMUNITY): Payer: Medicare Other

## 2022-03-31 ENCOUNTER — Encounter (HOSPITAL_COMMUNITY): Payer: Self-pay

## 2022-03-31 ENCOUNTER — Observation Stay (HOSPITAL_COMMUNITY)
Admission: EM | Admit: 2022-03-31 | Discharge: 2022-04-01 | Disposition: A | Discharge status: Home / Self Care | Payer: Medicare Other | Attending: Internal Medicine | Admitting: Internal Medicine

## 2022-03-31 ENCOUNTER — Other Ambulatory Visit: Payer: Self-pay

## 2022-03-31 DIAGNOSIS — I10 Essential (primary) hypertension: Secondary | ICD-10-CM | POA: Diagnosis present

## 2022-03-31 DIAGNOSIS — Z7901 Long term (current) use of anticoagulants: Secondary | ICD-10-CM | POA: Diagnosis not present

## 2022-03-31 DIAGNOSIS — N179 Acute kidney failure, unspecified: Secondary | ICD-10-CM | POA: Diagnosis not present

## 2022-03-31 DIAGNOSIS — E119 Type 2 diabetes mellitus without complications: Secondary | ICD-10-CM | POA: Diagnosis not present

## 2022-03-31 DIAGNOSIS — Z79899 Other long term (current) drug therapy: Secondary | ICD-10-CM | POA: Diagnosis not present

## 2022-03-31 DIAGNOSIS — E039 Hypothyroidism, unspecified: Secondary | ICD-10-CM | POA: Diagnosis not present

## 2022-03-31 DIAGNOSIS — R0602 Shortness of breath: Secondary | ICD-10-CM | POA: Diagnosis present

## 2022-03-31 DIAGNOSIS — I4891 Unspecified atrial fibrillation: Secondary | ICD-10-CM | POA: Diagnosis present

## 2022-03-31 DIAGNOSIS — M6281 Muscle weakness (generalized): Secondary | ICD-10-CM | POA: Diagnosis not present

## 2022-03-31 DIAGNOSIS — E11319 Type 2 diabetes mellitus with unspecified diabetic retinopathy without macular edema: Secondary | ICD-10-CM

## 2022-03-31 DIAGNOSIS — Z7984 Long term (current) use of oral hypoglycemic drugs: Secondary | ICD-10-CM | POA: Insufficient documentation

## 2022-03-31 DIAGNOSIS — Z8542 Personal history of malignant neoplasm of other parts of uterus: Secondary | ICD-10-CM | POA: Insufficient documentation

## 2022-03-31 DIAGNOSIS — I110 Hypertensive heart disease with heart failure: Secondary | ICD-10-CM | POA: Diagnosis not present

## 2022-03-31 DIAGNOSIS — I2510 Atherosclerotic heart disease of native coronary artery without angina pectoris: Secondary | ICD-10-CM | POA: Diagnosis not present

## 2022-03-31 DIAGNOSIS — Z1152 Encounter for screening for COVID-19: Secondary | ICD-10-CM | POA: Insufficient documentation

## 2022-03-31 DIAGNOSIS — I5031 Acute diastolic (congestive) heart failure: Secondary | ICD-10-CM | POA: Diagnosis not present

## 2022-03-31 DIAGNOSIS — I509 Heart failure, unspecified: Secondary | ICD-10-CM

## 2022-03-31 DIAGNOSIS — Z951 Presence of aortocoronary bypass graft: Secondary | ICD-10-CM | POA: Diagnosis not present

## 2022-03-31 DIAGNOSIS — R2681 Unsteadiness on feet: Secondary | ICD-10-CM | POA: Diagnosis not present

## 2022-03-31 LAB — RESP PANEL BY RT-PCR (RSV, FLU A&B, COVID)  RVPGX2
Influenza A by PCR: NEGATIVE
Influenza B by PCR: NEGATIVE
Resp Syncytial Virus by PCR: NEGATIVE
SARS Coronavirus 2 by RT PCR: NEGATIVE

## 2022-03-31 LAB — BASIC METABOLIC PANEL
Anion gap: 9 (ref 5–15)
BUN: 66 mg/dL — ABNORMAL HIGH (ref 8–23)
CO2: 19 mmol/L — ABNORMAL LOW (ref 22–32)
Calcium: 9.1 mg/dL (ref 8.9–10.3)
Chloride: 104 mmol/L (ref 98–111)
Creatinine, Ser: 1.62 mg/dL — ABNORMAL HIGH (ref 0.44–1.00)
GFR, Estimated: 30 mL/min — ABNORMAL LOW (ref >60)
Glucose, Bld: 135 mg/dL — ABNORMAL HIGH (ref 70–99)
Potassium: 4.9 mmol/L (ref 3.5–5.1)
Sodium: 132 mmol/L — ABNORMAL LOW (ref 135–145)

## 2022-03-31 LAB — CBC
HCT: 35.6 % — ABNORMAL LOW (ref 36.0–46.0)
Hemoglobin: 10.8 g/dL — ABNORMAL LOW (ref 12.0–15.0)
MCH: 24.5 pg — ABNORMAL LOW (ref 26.0–34.0)
MCHC: 30.3 g/dL (ref 30.0–36.0)
MCV: 80.7 fL (ref 80.0–100.0)
Platelets: 172 10*3/uL (ref 150–400)
RBC: 4.41 MIL/uL (ref 3.87–5.11)
RDW: 18.6 % — ABNORMAL HIGH (ref 11.5–15.5)
WBC: 5.9 10*3/uL (ref 4.0–10.5)
nRBC: 0 % (ref 0.0–0.2)

## 2022-03-31 LAB — URINALYSIS, ROUTINE W REFLEX MICROSCOPIC
Bilirubin Urine: NEGATIVE
Glucose, UA: NEGATIVE mg/dL
Ketones, ur: 5 mg/dL — AB
Nitrite: NEGATIVE
Protein, ur: >=300 mg/dL — AB
RBC / HPF: >50 RBC/hpf — ABNORMAL HIGH (ref 0–5)
Specific Gravity, Urine: 1.022 (ref 1.005–1.030)
pH: 7 (ref 5.0–8.0)

## 2022-03-31 LAB — BRAIN NATRIURETIC PEPTIDE: B Natriuretic Peptide: 2610 pg/mL — ABNORMAL HIGH (ref 0.0–100.0)

## 2022-03-31 LAB — PROCALCITONIN: Procalcitonin: <0.1 ng/mL

## 2022-03-31 MED ORDER — FENTANYL CITRATE PF 50 MCG/ML IJ SOSY
25.0000 ug | PREFILLED_SYRINGE | Freq: Once | INTRAMUSCULAR | Status: AC
  Administered 2022-03-31: 25 ug via INTRAVENOUS
  Filled 2022-03-31: qty 1

## 2022-03-31 NOTE — Assessment & Plan Note (Signed)
Continue synthroid.

## 2022-03-31 NOTE — ED Triage Notes (Signed)
Pt had a mechanical fall on 12/21 and then subsequently started having ShOB and lower back pain. Pt came to ED today at the urging of her family. Family states pt has more labored breathing with activity. Pt has associated cough.

## 2022-03-31 NOTE — H&P (Signed)
History and Physical    Patient: [name] XKG:[id number] DOB: Feb 27, 1931 DOA: 03/31/2022 DOS: the patient was seen and examined on 03/31/2022 PCP: Neale Burly, MD  Patient coming from: Home  Chief Complaint: fall and dyspnea HPI: [name] is a 88 y.o. female with medical history significant of history of atrial fibrillation coronary artery disease, diabetes mellitus type 2, hyperlipidemia, hypertensive retinal the constant intolerance, and more presents the ED with a chief complaint of dyspnea and fall.  Patient reports the fall occurred on the 21st.  She was in the bathroom standing at the sink, and she does not know what happened that caused her fall.  She denies chest pain and palpitations.  She did not lose consciousness.  She did not hit her head.  She is on eliquis.  Patient reports that she has had pain in her lower back since the fall.  It is worse when she is laying down or sitting.  It is better when she is walking.  She has not tried anything for it at home.  She denies any numbness in her lower extremities, or new incontinence.  Patient notes that she is also been more short of breath.  She reports shortness of breath has been ongoing for a while, but it became acutely worse a couple days ago.  She has had no fever.  She does have a dry cough that is been present for about 2 weeks.  She denies swelling in her legs even though it is present on exam.  She is diuretic at home and has been taking it daily with no missed doses.  Patient reports two-pillow orthopnea.  Her dyspnea is worse on exertion.  Lastly patient has had a decreased appetite.  She denies belly pain, nausea, vomiting, diarrhea.  Patient has no other complaints at this time.  Patient does not smoke, drink, or use illicit drugs.  She is vaccinated for COVID.  Patient is DNR. Review of Systems: As mentioned in the history of present illness. All other systems reviewed and are negative. Past Medical History:   Diagnosis Date   Atrial fibrillation [facility])    Maze procedure and ligation of left atrial appendage 2010   Coronary artery disease    Multivessel status post CABG 2010   Diabetic retinopathy [facility])    Endometrial ca [facility]) 09/25/2010   June, 2012, radiation and chemotherapy - Dr. Tressie Stalker   Essential hypertension    Hiatal hernia    Hyperlipidemia    Hypertensive retinopathy    Osteoarthritis    Statin intolerance    Type 2 diabetes mellitus ([place])    Past Surgical History:  Procedure Laterality Date   ABDOMINAL HYSTERECTOMY  07/2010   complete, endometrial cancer   APPENDECTOMY  60 yrs ago   BREAST MASS EXCISION     left breast [place] Bilateral 2006   Dr. Iona Hansen   choleycystectomy  1998   COLONOSCOPY     several in [place], last one by Dr. Rowe Pavy, polyps once in [place]. records requested.   COLONOSCOPY  06/2010   Dr. Rodrigo Ran good. Moderate diverticulosis in left colon. Inflammation found in descending colon, scope could not be advanced forward.  ACBE-->sigmoid diverticulosis no worrisome findings.    CORONARY ARTERY BYPASS GRAFT  12/21/2008   [place] Gum; LIMA LAD, SVG posterior descending, SVG OM, maze procedure, ligation left atrial appendage and MAZE procedure for A.fib   ESOPHAGOGASTRODUODENOSCOPY  03/08/2012  KDT:OIZTIWPY ring was found at the gastroesophageal junction/Nodular gastritis (inflammation)/duodenal mucosa showed no abnormalities    EYE SURGERY     PORT-A-CATH REMOVAL  01/23/2012   Procedure: REMOVAL PORT-A-CATH;  Surgeon: Jamesetta So, MD;  Location: [facility];  Service: General;  Laterality: N/A;  Minor Room   PORTACATH PLACEMENT     TONSILECTOMY, ADENOIDECTOMY, BILATERAL MYRINGOTOMY AND TUBES     TUMOR REMOVAL     from throat  benign   Social History:  reports that she has never smoked. She has never been exposed to tobacco smoke. She has never used smokeless tobacco. She reports that she does not drink  alcohol and does not use drugs.  Allergies  Allergen Reactions   Statins Other (See Comments)    No energy, fatigue.     Family History  Problem Relation Age of Onset   Hypertension Mother    Breast cancer Mother    Heart attack Mother        deceased, age 49   Cancer Mother    Diabetes Sister        deceased - 40, renal failure,    Heart attack Father    Cancer Father    Colon cancer Neg Hx    Ovarian cancer Neg Hx    Lung cancer Neg Hx    Liver disease Neg Hx     Prior to Admission medications   Medication Sig Start Date End Date Taking? Authorizing Provider  apixaban (ELIQUIS) 2.5 MG TABS tablet Take 2.5 mg by mouth 2 (two) times daily.   Yes [provider]  Calcium Carbonate-Vitamin D (OSCAL 500 D-3 PO) Take 1,200 mg by mouth in the morning and at bedtime.   Yes [provider]  Cholecalciferol (VITAMIN D PO) Take 500 Units by mouth daily.   Yes [provider]  denosumab (PROLIA) 60 MG/ML SOSY injection Inject 60 mg into the skin every 6 (six) months.   Yes [provider]  Diphenhydramine-APAP, sleep, (TYLENOL PM EXTRA STRENGTH PO) Take 1 tablet by mouth at bedtime as needed (sleep).   Yes [provider]  feeding supplement (ENSURE ENLIVE / ENSURE PLUS) LIQD Take 237 mLs by mouth 2 (two) times daily between meals. 10/02/21  Yes Shah, Pratik D, DO  furosemide (LASIX) 20 MG tablet Take 40 mg by mouth daily. 07/30/21  Yes [provider]  glipiZIDE (GLUCOTROL) 5 MG tablet Take 5 mg by mouth daily.   Yes [provider]  levothyroxine (SYNTHROID) 100 MCG tablet Take 100 mcg by mouth every morning. 01/23/22  Yes [provider]  levothyroxine (SYNTHROID) 50 MCG tablet Take 100 mcg by mouth daily. 08/20/21  Yes [provider]  magnesium oxide (MAG-OX) 400 MG tablet Take 400 mg by mouth daily.   Yes [provider]  metFORMIN (GLUCOPHAGE) 500 MG tablet Take 500 mg by mouth every evening.    Yes [provider]  metoprolol succinate (TOPROL-XL) 50 MG 24 hr tablet TAKE 1 AND 1/2 TABLETS BY MOUTH TWICE DAILY Patient taking differently: Take 75 mg by mouth in the morning and at bedtime. 08/01/19  Yes Herminio Commons, MD  Mirabegron (MYRBETRIQ PO) Take 50 mg by mouth daily. Patient received samples from Dr. Joselyn Arrow office. Does not know the strength at this time.   Yes [provider]  Multiple Vitamin (MULTIVITAMIN) tablet Take 1 tablet by mouth daily.   Yes [provider]  Omega-3 Fatty Acids (FISH OIL) 1200 MG CAPS Take 1,000  mg by mouth 2 (two) times daily.   Yes [provider]  traMADol (ULTRAM) 50 MG tablet Take 50 mg by mouth 2 (two) times daily. 12/31/21  Yes [provider]  Blood Glucose Monitoring Suppl (ONETOUCH VERIO REFLECT) w/Device KIT See admin instructions. 04/06/20   [provider]  calcium carbonate (OS-CAL) 1250 (500 Ca) MG chewable tablet Chew by mouth.    [provider]  Cholecalciferol 10 MCG (400 UNIT) CAPS Take by mouth.    [provider]  [facility] VERIO test strip SMARTSIG:Via Meter 3 Times a Week 04/06/20   [provider]    Physical Exam: Vitals:   03/31/22 1739 03/31/22 1815 03/31/22 2000 03/31/22 2001  BP:  114/79 104/72   Pulse:  (!) 45 71   Resp:  18 19   Temp:      TempSrc:      SpO2: 100% (!) 80% 91% 95%  Weight:      Height:       1.  General: Patient lying supine in bed,  no acute distress   2. Psychiatric: Alert and oriented x 3, mood and behavior normal for situation, pleasant and cooperative with exam   3. Neurologic: Speech and language are normal, face is symmetric, moves all 4 extremities voluntarily, at baseline without acute deficits on limited exam   4. HEENMT:  Head is atraumatic, normocephalic, pupils reactive to light, neck is supple, trachea is midline, mucous membranes are moist   5. Respiratory : Lungs are clear to auscultation  bilaterally without wheezing, rhonchi, rales, no cyanosis, no increase in work of breathing or accessory muscle use   6. Cardiovascular : Heart rate normal, rhythm is regularly irregular, no murmurs, rubs or gallops, no peripheral edema, peripheral pulses palpated   7. Gastrointestinal:  Abdomen is soft, nondistended, nontender to palpation bowel sounds active, no masses or organomegaly palpated   8. Skin:  Skin is warm, dry and intact without rashes, acute lesions, or ulcers on limited exam   9.Musculoskeletal:  No acute deformities or trauma, no asymmetry in tone, 2+ peripheral pitting edema, peripheral pulses palpated, no tenderness to palpation in the extremities  Data Reviewed: In the ED Temp 97.7, heart rate 45-124, respiratory rate 18-24, blood pressure 112/79-114/84 maintaining oxygen sats on room air.  There is an oxygen saturation noted at 80% which is an outlier and considered an error. No leukocytosis white blood cell count 5.9, hemoglobin 10.8 Chemistry is unremarkable aside from a slight hyponatremia 132 and elevated BUN at 66 and an AKI at 1.62 COVID, flu, RSV negative Lumbar spine imaging shows remote compression fraction of T12-L1 and advanced degenerative disc disease.  X-ray of the pelvis shows remote fractures, but nothing acute.  EKG shows A-fib with RVR 124 with QTc 4 and 65, RVR is resolved at the time of my exam.  There are also chronic ST changes since June 2023.  Fentanyl was given for pain and offered no relief. Chest x-ray shows mild bilateral interstitial opacities likely pulmonary edema Admission was requested for further management and evaluation of AKI which at this point is thought to be due to cardiorenal syndrome  Assessment and Plan: * AKI (acute kidney injury) ([place]) -0.96 >> 1.62 -Cardio-renal syndrome -Start diuresis -Strict intake and output -Fluid restrictions -Trend in the AM  Atrial fibrillation (HCC) -EKG shows afib - chronic -Continue  eliquis and metoprolol -Continue to monitor  Hypothyroidism -Continue synthroid  Essential hypertension -Continue metoprolol -Bp well controlled in the ED -  Closely monitor in the setting of increased lasix dose as well  DM II (diabetes mellitus, type II), controlled (HCC) -Hold glipizide -Sliding scale coverage -Carb modified diet -Glucose 135 -Continue to monitor  Acute diastolic CHF (congestive heart failure) ([place]) -Last echo 08/25/19 = EF 50-55% with indeterminate diastolic parameters -Orthopnea, dyspnea on exertion, and pulm edema on Chest Xray, BNP 2600+ -Update echo -Continue metoprolol -Strict intake and output -fluid restrictions -Continue diuresis -Continue to monitor      Advance Care Planning:   Code Status: DNR   Consults: None  Family Communication: Sister and son at bedside  Severity of Illness: The appropriate patient status for this patient is OBSERVATION. Observation status is judged to be reasonable and necessary in order to provide the required intensity of service to ensure the patient's safety. The patient's presenting symptoms, physical exam findings, and initial radiographic and laboratory data in the context of their medical condition is felt to place them at decreased risk for further clinical deterioration. Furthermore, it is anticipated that the patient will be medically stable for discharge from the hospital within 2 midnights of admission.   Author: Rolla Plate, DO 03/31/2022 9:28 PM  For on call review [email or web address].

## 2022-03-31 NOTE — Assessment & Plan Note (Signed)
-  EKG shows afib - chronic -Continue eliquis and metoprolol -Continue to monitor

## 2022-03-31 NOTE — ED Provider Notes (Signed)
[facility] EMERGENCY DEPARTMENT Provider Note   CSN: [id number] Arrival date & time: 03/31/22  1650     History  No chief complaint on file.   [name] is a 88 y.o. female.  HPI 88 year old female presents with a chief complaint of back pain and cough/dyspnea.  History is from patient and family.  She fell on 12/21, injuring her low back pain.  Her back pain seems to be getting worse.  No new weakness or numbness in her legs.  No hip pain.  She is also had cough and shortness of breath without leg swelling for the same amount of time.  No fevers or chest pain.  Home Medications Prior to Admission medications   Medication Sig Start Date End Date Taking? Authorizing Provider  apixaban (ELIQUIS) 2.5 MG TABS tablet Take 2.5 mg by mouth 2 (two) times daily.   Yes [provider]  Calcium Carbonate-Vitamin D (OSCAL 500 D-3 PO) Take 1,200 mg by mouth in the morning and at bedtime.   Yes [provider]  Cholecalciferol (VITAMIN D PO) Take 500 Units by mouth daily.   Yes [provider]  denosumab (PROLIA) 60 MG/ML SOSY injection Inject 60 mg into the skin every 6 (six) months.   Yes [provider]  Diphenhydramine-APAP, sleep, (TYLENOL PM EXTRA STRENGTH PO) Take 1 tablet by mouth at bedtime as needed (sleep).   Yes [provider]  feeding supplement (ENSURE ENLIVE / ENSURE PLUS) LIQD Take 237 mLs by mouth 2 (two) times daily between meals. 10/02/21  Yes Shah, Pratik D, DO  furosemide (LASIX) 20 MG tablet Take 40 mg by mouth daily. 07/30/21  Yes [provider]  glipiZIDE (GLUCOTROL) 5 MG tablet Take 5 mg by mouth daily.   Yes [provider]  levothyroxine (SYNTHROID) 100 MCG tablet Take 100 mcg by mouth every morning. 01/23/22  Yes [provider]  levothyroxine (SYNTHROID) 50 MCG tablet Take 100 mcg by mouth daily. 08/20/21  Yes [provider]  magnesium oxide (MAG-OX) 400 MG tablet Take 400 mg by  mouth daily.   Yes [provider]  metFORMIN (GLUCOPHAGE) 500 MG tablet Take 500 mg by mouth every evening.   Yes [provider]  metoprolol succinate (TOPROL-XL) 50 MG 24 hr tablet TAKE 1 AND 1/2 TABLETS BY MOUTH TWICE DAILY Patient taking differently: Take 75 mg by mouth in the morning and at bedtime. 08/01/19  Yes Herminio Commons, MD  Mirabegron (MYRBETRIQ PO) Take 50 mg by mouth daily. Patient received samples from Dr. Joselyn Arrow office. Does not know the strength at this time.   Yes [provider]  Multiple Vitamin (MULTIVITAMIN) tablet Take 1 tablet by mouth daily.   Yes [provider]  Omega-3 Fatty Acids (FISH OIL) 1200 MG CAPS Take 1,000 mg by mouth 2 (two) times daily.   Yes [provider]  traMADol (ULTRAM) 50 MG tablet Take 50 mg by mouth 2 (two) times daily. 12/31/21  Yes [provider]  Blood Glucose Monitoring Suppl (ONETOUCH VERIO REFLECT) w/Device KIT See admin instructions. 04/06/20   [provider]  calcium carbonate (OS-CAL) 1250 (500 Ca) MG chewable tablet Chew by mouth.    [provider]  Cholecalciferol 10 MCG (400 UNIT) CAPS Take by mouth.    [provider]  Roma Schanz test strip SMARTSIG:Via Meter 3 Times a Week 04/06/20   [provider]      Allergies    Statins  Review of Systems   Review of Systems  Constitutional:  Negative for fever.  Respiratory:  Positive for cough and shortness of breath.   Cardiovascular:  Negative for chest pain and leg swelling.  Gastrointestinal:  Negative for abdominal pain.  Musculoskeletal:  Positive for back pain.  Neurological:  Negative for weakness.    Physical Exam Updated Vital Signs BP 115/78   Pulse (!) 105   Temp 97.6 F (36.4 C) (Oral)   Resp 16   Ht _0  (1.549 m)   Wt 74.4 kg   SpO2 94%   BMI 30.99 kg/m  Physical Exam Vitals and nursing note reviewed.  Constitutional:      Appearance: She is  well-developed.  HENT:     Head: Normocephalic and atraumatic.  Cardiovascular:     Rate and Rhythm: Tachycardia present. Rhythm irregular.     Heart sounds: Normal heart sounds.  Pulmonary:     Effort: Pulmonary effort is normal.     Breath sounds: Normal breath sounds. No wheezing.  Abdominal:     General: There is no distension.     Palpations: Abdomen is soft.     Tenderness: There is no abdominal tenderness.  Musculoskeletal:     Lumbar back: Tenderness present.       Back:     Right hip: No tenderness. Normal range of motion.     Left hip: No tenderness. Normal range of motion.     Comments: No significant lower extremity edema noted  Skin:    General: Skin is warm and dry.  Neurological:     Mental Status: She is alert.     Comments: Equal strength in BLE. Grossly normal sensation     ED Results / Procedures / Treatments   Labs (all labs ordered are listed, but only abnormal results are displayed) Labs Reviewed  BASIC METABOLIC PANEL - Abnormal; Notable for the following components:      Result Value   Sodium 132 (*)    CO2 19 (*)    Glucose, Bld 135 (*)    BUN 66 (*)    Creatinine, Ser 1.62 (*)    GFR, Estimated 30 (*)    All other components within normal limits  CBC - Abnormal; Notable for the following components:   Hemoglobin 10.8 (*)    HCT 35.6 (*)    MCH 24.5 (*)    RDW 18.6 (*)    All other components within normal limits  BRAIN NATRIURETIC PEPTIDE - Abnormal; Notable for the following components:   B Natriuretic Peptide 2,610.0 (*)    All other components within normal limits  URINALYSIS, ROUTINE W REFLEX MICROSCOPIC - Abnormal; Notable for the following components:   Color, Urine AMBER (*)    APPearance CLOUDY (*)    Hgb urine dipstick SMALL (*)    Ketones, ur 5 (*)    Protein, ur >=300 (*)    Leukocytes,Ua TRACE (*)    RBC / HPF >50 (*)    Bacteria, UA RARE (*)    All other components within normal limits  RESP PANEL BY RT-PCR (RSV, FLU  A&B, COVID)  [id number]  PROCALCITONIN    EKG EKG Interpretation  Date/Time:  Monday March 31 2022 17:03:42 EST Ventricular Rate:  124 PR Interval:    QRS Duration: 94 QT Interval:  324 QTC Calculation: 465 R Axis:   141 Text Interpretation: Atrial fibrillation with rapid ventricular response Left posterior fascicular block Cannot rule out Anterior infarct , age  undetermined  ST/T changes similar to June 2023 Confirmed by Sherwood Gambler [phone]) on 03/31/2022 5:12:15 PM  Radiology DG Pelvis 1-2 Views  Result Date: 03/31/2022 CLINICAL DATA:  Fall EXAM: PELVIS - 1-2 VIEW COMPARISON:  CT 09/28/2021 FINDINGS: Remote fracture deformities of the a iliac wings, sacral ala a, and parasymphyseal pubic rami bilaterally with resultant diastasis of the pubic symphysis and hyperostotic changes are again identified and are unchanged. No acute fracture or dislocation. Vascular calcifications are noted. IMPRESSION: 1. No acute fracture or dislocation. 2. Remote fracture deformities of the pelvis as described above. Electronically Signed   By: Fidela Salisbury M.D.   On: 03/31/2022 18:16   DG Lumbar Spine Complete  Result Date: 03/31/2022 CLINICAL DATA:  Fall EXAM: LUMBAR SPINE - COMPLETE 4+ VIEW COMPARISON:  None Available. FINDINGS: Mild lumbar dextrocurvature, apex right at L1. No acute fracture or listhesis of the lumbar spine. Stable remote compression deformities of T12 and L1. Intervertebral disc space narrowing and endplate remodeling is seen throughout the lumbar spine in keeping with changes of advanced degenerative disc disease. Facet arthrosis is seen throughout the lumbar spine, not well profiled on this examination. No remote bilateral sacral alar fractures are identified. Apparent lucency involving the distal sacrum relates to hypertrophic changes involving the marked cranial sacrum and sacral ala bilaterally appears stable since prior CT examination IMPRESSION: 1. No acute fracture or listhesis.  2. Remote compression deformities of T12 and L1. 3. Advanced degenerative disc and degenerative joint disease. Electronically Signed   By: Fidela Salisbury M.D.   On: 03/31/2022 18:15   DG Chest Port 1 View  Result Date: 03/31/2022 CLINICAL DATA:  Cough and wheezing EXAM: PORTABLE CHEST 1 VIEW COMPARISON:  Chest x-ray dated Aug 26, 2020 FINDINGS: Unchanged cardiomegaly status post median sternotomy and CABG. Mild bilateral interstitial opacities, similar to prior exam. No focal consolidation. No large pleural effusion or evidence of pneumothorax IMPRESSION: Mild bilateral interstitial opacities, similar to prior exam, likely due to mild pulmonary edema. Electronically Signed   By: Yetta Glassman M.D.   On: 03/31/2022 17:32    Procedures Procedures    Medications Ordered in ED Medications  fentaNYL (SUBLIMAZE) injection 25 mcg (25 mcg Intravenous Given 03/31/22 1813)    ED Course/ Medical Decision Making/ A&P                           Medical Decision Making Amount and/or Complexity of Data Reviewed Labs: ordered.    Details: Acute kidney injury.  BNP of 2600 most consistent with CHF.  COVID/flu negative. Radiology: ordered and independent interpretation performed.    Details: Mild CHF on chest x-ray No lumbar or pelvic fracture ECG/medicine tests: ordered and independent interpretation performed.    Details: A-fib with RVR  Risk Prescription drug management. Decision regarding hospitalization.   Patient has some soft blood pressures but is not outright hypotensive.  She has A-fib though I suspect the tachycardia is at least partially due to pain.  She has some trace edema in her lower extremities but is not impressive.  Unclear if her acute kidney injury is from dehydration as family is concerned she is not eating and drinking very well but her workup is also consistent with CHF.  Discussed with hospitalist, Dr. Clearence Ped. She is difficult to tell, but for now will treat as CHF  and monitor in hospital.        Final Clinical Impression(s) / ED Diagnoses Final diagnoses:  Acute kidney injury ([place])  Acute congestive heart failure, unspecified heart failure type [facility])    Rx / DC Orders ED Discharge Orders     None         Sherwood Gambler, MD 03/31/22 2333

## 2022-03-31 NOTE — Assessment & Plan Note (Signed)
-  Last echo 08/25/19 = EF 50-55% with indeterminate diastolic parameters -Orthopnea, dyspnea on exertion, and pulm edema on Chest Xray, BNP 2600+ -Update echo; read pending at discharge - rapid improvement with IV lasix; patient not on RA and no further dyspnea; stable for discharge home - continue Toprol and home regimen

## 2022-03-31 NOTE — Assessment & Plan Note (Signed)
-  Continue metoprolol -Bp well controlled in the ED -Closely monitor in the setting of increased lasix dose as well

## 2022-03-31 NOTE — Assessment & Plan Note (Signed)
-  Hold glipizide -Sliding scale coverage -Carb modified diet -Glucose 135 -Continue to monitor

## 2022-03-31 NOTE — Assessment & Plan Note (Addendum)
-  0.96 >> 1.62 -Cardio-renal syndrome -Start diuresis -Strict intake and output -Fluid restrictions -Trend in the AM

## 2022-04-01 ENCOUNTER — Observation Stay (HOSPITAL_BASED_OUTPATIENT_CLINIC_OR_DEPARTMENT_OTHER): Payer: Medicare Other

## 2022-04-01 ENCOUNTER — Encounter (HOSPITAL_COMMUNITY): Payer: Self-pay | Admitting: Family Medicine

## 2022-04-01 DIAGNOSIS — I5031 Acute diastolic (congestive) heart failure: Secondary | ICD-10-CM

## 2022-04-01 DIAGNOSIS — N179 Acute kidney failure, unspecified: Secondary | ICD-10-CM | POA: Diagnosis not present

## 2022-04-01 LAB — ECHOCARDIOGRAM COMPLETE
AR max vel: 1.16 cm2
AV Area VTI: 1.06 cm2
AV Area mean vel: 1.11 cm2
AV Mean grad: 14.2 mmHg
AV Peak grad: 27.9 mmHg
Ao pk vel: 2.64 m/s
Height: 61 in
S' Lateral: 1.96 cm
Weight: 2804.25 oz

## 2022-04-01 LAB — CBC WITH DIFFERENTIAL/PLATELET
Abs Immature Granulocytes: 0.02 10*3/uL (ref 0.00–0.07)
Basophils Absolute: 0 10*3/uL (ref 0.0–0.1)
Basophils Relative: 0 %
Eosinophils Absolute: 0.1 10*3/uL (ref 0.0–0.5)
Eosinophils Relative: 2 %
HCT: 32.3 % — ABNORMAL LOW (ref 36.0–46.0)
Hemoglobin: 10.1 g/dL — ABNORMAL LOW (ref 12.0–15.0)
Immature Granulocytes: 0 %
Lymphocytes Relative: 12 %
Lymphs Abs: 0.6 10*3/uL — ABNORMAL LOW (ref 0.7–4.0)
MCH: 24.9 pg — ABNORMAL LOW (ref 26.0–34.0)
MCHC: 31.3 g/dL (ref 30.0–36.0)
MCV: 79.6 fL — ABNORMAL LOW (ref 80.0–100.0)
Monocytes Absolute: 0.7 10*3/uL (ref 0.1–1.0)
Monocytes Relative: 14 %
Neutro Abs: 3.7 10*3/uL (ref 1.7–7.7)
Neutrophils Relative %: 72 %
Platelets: 143 10*3/uL — ABNORMAL LOW (ref 150–400)
RBC: 4.06 MIL/uL (ref 3.87–5.11)
RDW: 18.3 % — ABNORMAL HIGH (ref 11.5–15.5)
WBC: 5.1 10*3/uL (ref 4.0–10.5)
nRBC: 0 % (ref 0.0–0.2)

## 2022-04-01 LAB — COMPREHENSIVE METABOLIC PANEL
ALT: 15 U/L (ref 0–44)
AST: 28 U/L (ref 15–41)
Albumin: 3.2 g/dL — ABNORMAL LOW (ref 3.5–5.0)
Alkaline Phosphatase: 88 U/L (ref 38–126)
Anion gap: 10 (ref 5–15)
BUN: 65 mg/dL — ABNORMAL HIGH (ref 8–23)
CO2: 18 mmol/L — ABNORMAL LOW (ref 22–32)
Calcium: 8.9 mg/dL (ref 8.9–10.3)
Chloride: 105 mmol/L (ref 98–111)
Creatinine, Ser: 1.53 mg/dL — ABNORMAL HIGH (ref 0.44–1.00)
GFR, Estimated: 32 mL/min — ABNORMAL LOW (ref >60)
Glucose, Bld: 138 mg/dL — ABNORMAL HIGH (ref 70–99)
Potassium: 4.6 mmol/L (ref 3.5–5.1)
Sodium: 133 mmol/L — ABNORMAL LOW (ref 135–145)
Total Bilirubin: 1.2 mg/dL (ref 0.3–1.2)
Total Protein: 6.7 g/dL (ref 6.5–8.1)

## 2022-04-01 LAB — GLUCOSE, CAPILLARY
Glucose-Capillary: 147 mg/dL — ABNORMAL HIGH (ref 70–99)
Glucose-Capillary: 133 mg/dL — ABNORMAL HIGH (ref 70–99)
Glucose-Capillary: 200 mg/dL — ABNORMAL HIGH (ref 70–99)

## 2022-04-01 LAB — HEMOGLOBIN A1C
Hgb A1c MFr Bld: 6.2 % — ABNORMAL HIGH (ref 4.8–5.6)
Mean Plasma Glucose: 131 mg/dL

## 2022-04-01 LAB — MAGNESIUM: Magnesium: 2 mg/dL (ref 1.7–2.4)

## 2022-04-01 MED ORDER — ONDANSETRON HCL 4 MG PO TABS: 4.0000 mg | ORAL_TABLET | Freq: Four times a day (QID) | ORAL | Status: DC | PRN

## 2022-04-01 MED ORDER — MIRABEGRON ER 25 MG PO TB24
50.0000 mg | ORAL_TABLET | Freq: Every day | ORAL | Status: DC
  Administered 2022-04-01: 50 mg via ORAL
  Filled 2022-04-01: qty 2

## 2022-04-01 MED ORDER — FUROSEMIDE 10 MG/ML IJ SOLN
40.0000 mg | Freq: Two times a day (BID) | INTRAMUSCULAR | Status: DC
  Administered 2022-04-01 (×2): 40 mg via INTRAVENOUS
  Filled 2022-04-01 (×2): qty 4

## 2022-04-01 MED ORDER — MORPHINE SULFATE (PF) 2 MG/ML IV SOLN: 2.0000 mg | INTRAVENOUS | Status: DC | PRN

## 2022-04-01 MED ORDER — LEVALBUTEROL HCL 0.63 MG/3ML IN NEBU: 0.6300 mg | INHALATION_SOLUTION | Freq: Four times a day (QID) | RESPIRATORY_TRACT | Status: DC | PRN

## 2022-04-01 MED ORDER — OXYCODONE HCL 5 MG PO TABS
5.0000 mg | ORAL_TABLET | ORAL | Status: DC | PRN
  Administered 2022-04-01: 5 mg via ORAL
  Filled 2022-04-01: qty 1

## 2022-04-01 MED ORDER — ACETAMINOPHEN 650 MG RE SUPP: 650.0000 mg | Freq: Four times a day (QID) | RECTAL | Status: DC | PRN

## 2022-04-01 MED ORDER — APIXABAN 2.5 MG PO TABS
2.5000 mg | ORAL_TABLET | Freq: Two times a day (BID) | ORAL | Status: DC
  Administered 2022-04-01 (×2): 2.5 mg via ORAL
  Filled 2022-04-01 (×2): qty 1

## 2022-04-01 MED ORDER — METOPROLOL SUCCINATE ER 25 MG PO TB24
25.0000 mg | ORAL_TABLET | Freq: Once | ORAL | Status: AC
  Administered 2022-04-01: 25 mg via ORAL
  Filled 2022-04-01: qty 1

## 2022-04-01 MED ORDER — METOPROLOL SUCCINATE ER 50 MG PO TB24
50.0000 mg | ORAL_TABLET | Freq: Every day | ORAL | Status: DC
  Administered 2022-04-01: 50 mg via ORAL
  Filled 2022-04-01: qty 1

## 2022-04-01 MED ORDER — LEVOTHYROXINE SODIUM 50 MCG PO TABS
50.0000 ug | ORAL_TABLET | Freq: Every day | ORAL | Status: DC
  Administered 2022-04-01: 50 ug via ORAL
  Filled 2022-04-01: qty 1

## 2022-04-01 MED ORDER — INSULIN ASPART 100 UNIT/ML IJ SOLN
0.0000 [IU] | Freq: Three times a day (TID) | INTRAMUSCULAR | Status: DC
  Administered 2022-04-01: 2 [IU] via SUBCUTANEOUS
  Administered 2022-04-01: 3 [IU] via SUBCUTANEOUS

## 2022-04-01 MED ORDER — ENSURE ENLIVE PO LIQD: 237.0000 mL | Freq: Two times a day (BID) | ORAL | Status: DC

## 2022-04-01 MED ORDER — ONDANSETRON HCL 4 MG/2ML IJ SOLN: 4.0000 mg | Freq: Four times a day (QID) | INTRAMUSCULAR | Status: DC | PRN

## 2022-04-01 MED ORDER — LIVING BETTER WITH HEART FAILURE BOOK: Freq: Once | Status: DC

## 2022-04-01 MED ORDER — INSULIN ASPART 100 UNIT/ML IJ SOLN: 0.0000 [IU] | Freq: Every day | INTRAMUSCULAR | Status: DC

## 2022-04-01 MED ORDER — ACETAMINOPHEN 325 MG PO TABS: 650.0000 mg | ORAL_TABLET | Freq: Four times a day (QID) | ORAL | Status: DC | PRN

## 2022-04-01 NOTE — TOC Progression Note (Signed)
  Transition of Care [facility]) Screening Note   Patient Details  Name: [name] Date of Birth: 09/24/1930   Transition of Care [facility]) CM/SW Contact:    Shade Flood, LCSW Phone Number: 04/01/2022, 12:02 PM    Transition of Care Department [facility]) has reviewed patient and no TOC needs have been identified at this time. We will continue to monitor patient advancement through interdisciplinary progression rounds. If new patient transition needs arise, please place a TOC consult.

## 2022-04-01 NOTE — Evaluation (Signed)
Physical Therapy Evaluation Patient Details Name: [name] MRN: [id number] DOB: September 10, 1930 Today's Date: 04/01/2022  History of Present Illness  [name] is a 88 y.o. female with medical history significant of history of atrial fibrillation coronary artery disease, diabetes mellitus type 2, hyperlipidemia, hypertensive retinal the constant intolerance, and more presents the ED with a chief complaint of dyspnea and fall.  Patient reports the fall occurred on the 21st.  She was in the bathroom standing at the sink, and she does not know what happened that caused her fall.  She denies chest pain and palpitations.  She did not lose consciousness.  She did not hit her head.  She is on eliquis.  Patient reports that she has had pain in her lower back since the fall.  It is worse when she is laying down or sitting.  It is better when she is walking.  She has not tried anything for it at home.  She denies any numbness in her lower extremities, or new incontinence.  Patient notes that she is also been more short of breath.  She reports shortness of breath has been ongoing for a while, but it became acutely worse a couple days ago.  She has had no fever.  She does have a dry cough that is been present for about 2 weeks.  She denies swelling in her legs even though it is present on exam.  She is diuretic at home and has been taking it daily with no missed doses.  Patient reports two-pillow orthopnea.  Her dyspnea is worse on exertion.  Lastly patient has had a decreased appetite.  She denies belly pain, nausea, vomiting, diarrhea.  Patient has no other complaints at this time.   Clinical Impression  Patient functioning near baseline for functional mobility and gait demonstrating good return for bed mobility, transfers and walking in room using RW without loss of balance.  Patient tolerated sitting up in chair after therapy.  PLAN:  Patient to be discharged home today and discharged from acute physical  therapy to care of nursing for ambulation as tolerated for length of stay with recommendations stated below          Recommendations for follow up therapy are one component of a multi-disciplinary discharge planning process, led by the attending physician.  Recommendations may be updated based on patient status, additional functional criteria and insurance authorization.  Follow Up Recommendations No PT follow up      Assistance Recommended at Discharge Set up Supervision/Assistance  Patient can return home with the following  A little help with walking and/or transfers;A little help with bathing/dressing/bathroom;Help with stairs or ramp for entrance;Assistance with cooking/housework    Equipment Recommendations None recommended by PT  Recommendations for Other Services       Functional Status Assessment Patient has not had a recent decline in their functional status     Precautions / Restrictions Precautions Precautions: Fall Restrictions Weight Bearing Restrictions: No      Mobility  Bed Mobility Overal bed mobility: Modified Independent                  Transfers Overall transfer level: Needs assistance Equipment used: Rolling walker (2 wheels) Transfers: Sit to/from Stand, Bed to chair/wheelchair/BSC Sit to Stand: Supervision   Step pivot transfers: Supervision       General transfer comment: slightly labored movement without loss of balance    Ambulation/Gait Ambulation/Gait assistance: Supervision Gait Distance (Feet): 25 Feet Assistive device: Rolling  walker (2 wheels) Gait Pattern/deviations: Decreased step length - left, Decreased stance time - right, Decreased stride length Gait velocity: decreased     General Gait Details: slightly labored cadence with good return for walking in room without loss of balance  Stairs            Wheelchair Mobility    Modified Rankin (Stroke Patients Only)       Balance Overall balance  assessment: Needs assistance Sitting-balance support: Feet supported, No upper extremity supported Sitting balance-Leahy Scale: Good Sitting balance - Comments: seated at EOB   Standing balance support: During functional activity, Bilateral upper extremity supported Standing balance-Leahy Scale: Fair Standing balance comment: fair/good using RW                             Pertinent Vitals/Pain Pain Assessment Pain Assessment: 0-10 Pain Score: 5  Pain Location: low back Pain Descriptors / Indicators: Sore, Aching Pain Intervention(s): Limited activity within patient's tolerance, Monitored during session, Repositioned    Home Living Family/patient expects to be discharged to:: Private residence Living Arrangements: Alone Available Help at Discharge: Family;Available 24 hours/day Type of Home: House Home Access: Ramped entrance       Home Layout: One level Home Equipment: Shower seat - built Medical sales representative (2 wheels)      Prior Function Prior Level of Function : Independent/Modified Independent             Mobility Comments: household and short distanced community ambulator using RW ADLs Comments: assisted by family     Hand Dominance        Extremity/Trunk Assessment   Upper Extremity Assessment Upper Extremity Assessment: Overall WFL for tasks assessed    Lower Extremity Assessment Lower Extremity Assessment: Generalized weakness    Cervical / Trunk Assessment Cervical / Trunk Assessment: Normal  Communication   Communication: No difficulties  Cognition Arousal/Alertness: Awake/alert Behavior During Therapy: WFL for tasks assessed/performed Overall Cognitive Status: Within Functional Limits for tasks assessed                                          General Comments      Exercises     Assessment/Plan    PT Assessment Patient does not need any further PT services  PT Problem List         PT Treatment  Interventions      PT Goals (Current goals can be found in the Care Plan section)  Acute Rehab PT Goals Patient Stated Goal: return home with family to assist PT Goal Formulation: With patient Time For Goal Achievement: 04/01/22 Potential to Achieve Goals: Good    Frequency       Co-evaluation               AM-PAC PT "6 Clicks" Mobility  Outcome Measure Help needed turning from your back to your side while in a flat bed without using bedrails?: None Help needed moving from lying on your back to sitting on the side of a flat bed without using bedrails?: None Help needed moving to and from a bed to a chair (including a wheelchair)?: A Little Help needed standing up from a chair using your arms (e.g., wheelchair or bedside chair)?: A Little Help needed to walk in hospital room?: A Little Help needed climbing 3-5 steps with a railing? :  A Little 6 Click Score: 20    End of Session   Activity Tolerance: Patient tolerated treatment well;Patient limited by fatigue Patient left: in chair;with call bell/phone within reach Nurse Communication: Mobility status PT Visit Diagnosis: Unsteadiness on feet (R26.81);Other abnormalities of gait and mobility (R26.89);Muscle weakness (generalized) (M62.81)    Time: 0947-0962 PT Time Calculation (min) (ACUTE ONLY): 16 min   Charges:   PT Evaluation $PT Eval Low Complexity: 1 Low PT Treatments $Therapeutic Activity: 8-22 mins        1:45 PM, 04/01/22 Lonell Grandchild, MPT Physical Therapist with [facility] 336 [phone] office [phone] mobile phone

## 2022-04-01 NOTE — Discharge Summary (Signed)
Physician Discharge Summary   LAURANA MAGISTRO JYN:[id number] DOB: Jun 15, 1930 DOA: 03/31/2022  PCP: Neale Burly, MD  Admit date: 03/31/2022 Discharge date: 04/01/2022 Barriers to discharge: none  Admitted From: Home Disposition:  Home Discharging physician: Dwyane Dee, MD  Recommendations for Outpatient Follow-up:  Continue following with cardiology  Home Health:  Equipment/Devices:   Discharge Condition: stable CODE STATUS: DNR Diet recommendation:  Diet Orders (From admission, onward)     Start     Ordered   04/01/22 0024  Diet heart healthy/carb modified Room service appropriate? Yes; Fluid consistency: Thin; Fluid restriction: 1500 mL Fluid  Diet effective now       Question Answer Comment  Diet-HS Snack? Nothing   Room service appropriate? Yes   Fluid consistency: Thin   Fluid restriction: 1500 mL Fluid      04/01/22 0024   04/01/22 0000  Diet - low sodium heart healthy        04/01/22 1147           Assessment and Plan: * AKI (acute kidney injury) ([place]) - Suspect component of CRS - Renal function improved with Lasix - Home regimen continued at discharge  Acute diastolic CHF (congestive heart failure) ([place]) -Last echo 08/25/19 = EF 50-55% with indeterminate diastolic parameters -Orthopnea, dyspnea on exertion, and pulm edema on Chest Xray, BNP 2600+ -Update echo; read pending at discharge - rapid improvement with IV lasix; patient not on RA and no further dyspnea; stable for discharge home - continue Toprol and home regimen  Atrial fibrillation (HCC) -EKG shows afib - chronic -Continue eliquis and metoprolol  Hypothyroidism -Continue synthroid  Essential hypertension - Continue home regimen  DM II (diabetes mellitus, type II), controlled ([place]) - resume home regimen   The patient's chronic medical conditions were treated accordingly per the patient's home medication regimen except as noted.  On day of discharge, patient was felt deemed  stable for discharge. Patient/family member advised to call PCP or come back to ER if needed.   Principal Diagnosis: AKI (acute kidney injury) [facility])  Discharge Diagnoses: Active Hospital Problems   Diagnosis Date Noted   AKI (acute kidney injury) ([place]) 86/57/8469   Acute diastolic CHF (congestive heart failure) ([place]) 08/26/2019    Priority: High   Atrial fibrillation ([place])     Priority: Medium    Hypothyroidism 09/28/2021    Priority: Low   DM II (diabetes mellitus, type II), controlled ([place]) 01/23/2009    Priority: Low   Essential hypertension 01/23/2009    Priority: [facility] Problems  No resolved problems to display.     Discharge Instructions     Diet - low sodium heart healthy   Complete by: As directed    Increase activity slowly   Complete by: As directed       Allergies as of 04/01/2022       Reactions   Statins Other (See Comments)   No energy, fatigue.         Medication List     TAKE these medications    calcium carbonate 1250 (500 Ca) MG chewable tablet Commonly known as: OS-CAL Chew by mouth.   Cholecalciferol 10 MCG (400 UNIT) Caps Take by mouth.   denosumab 60 MG/ML Sosy injection Commonly known as: PROLIA Inject 60 mg into the skin every 6 (six) months.   Eliquis 2.5 MG Tabs tablet Generic drug: apixaban Take 2.5 mg by mouth 2 (two) times daily.   feeding supplement Liqd Take  237 mLs by mouth 2 (two) times daily between meals.   Fish Oil 1200 MG Caps Take 1,000 mg by mouth 2 (two) times daily.   furosemide 20 MG tablet Commonly known as: LASIX Take 40 mg by mouth daily.   glipiZIDE 5 MG tablet Commonly known as: GLUCOTROL Take 5 mg by mouth daily.   levothyroxine 100 MCG tablet Commonly known as: SYNTHROID Take 100 mcg by mouth every morning. What changed: Another medication with the same name was removed. Continue taking this medication, and follow the directions you see here.   magnesium oxide 400 MG  tablet Commonly known as: MAG-OX Take 400 mg by mouth daily.   metFORMIN 500 MG tablet Commonly known as: GLUCOPHAGE Take 500 mg by mouth every evening.   metoprolol succinate 50 MG 24 hr tablet Commonly known as: TOPROL-XL TAKE 1 AND 1/2 TABLETS BY MOUTH TWICE DAILY What changed: when to take this   multivitamin tablet Take 1 tablet by mouth daily.   MYRBETRIQ PO Take 50 mg by mouth daily. Patient received samples from Dr. Joselyn Arrow office. Does not know the strength at this time.   OneTouch Verio Reflect w/Device Kit See admin instructions.   OneTouch Verio test strip Generic drug: glucose blood SMARTSIG:Via Meter 3 Times a Week   OSCAL 500 D-3 PO Take 1,200 mg by mouth in the morning and at bedtime.   traMADol 50 MG tablet Commonly known as: ULTRAM Take 50 mg by mouth 2 (two) times daily.   TYLENOL PM EXTRA STRENGTH PO Take 1 tablet by mouth at bedtime as needed (sleep).   VITAMIN D PO Take 500 Units by mouth daily.        Allergies  Allergen Reactions   Statins Other (See Comments)    No energy, fatigue.     Consultations:   Procedures:   Discharge Exam: BP 117/80   Pulse 69   Temp 97.6 F (36.4 C) (Oral)   Resp (!) 24   Ht _0  (1.549 m)   Wt 79.5 kg   SpO2 100%   BMI 33.12 kg/m  Physical Exam Constitutional:      General: She is not in acute distress.    Appearance: Normal appearance. She is not ill-appearing.  HENT:     Head: Normocephalic and atraumatic.     Mouth/Throat:     Mouth: Mucous membranes are moist.  Eyes:     Extraocular Movements: Extraocular movements intact.  Cardiovascular:     Rate and Rhythm: Normal rate. Rhythm irregular.  Pulmonary:     Effort: Pulmonary effort is normal.     Breath sounds: Normal breath sounds.  Abdominal:     General: Bowel sounds are normal. There is no distension.     Palpations: Abdomen is soft.     Tenderness: There is no abdominal tenderness.  Musculoskeletal:        General:  Normal range of motion.     Cervical back: Normal range of motion and neck supple.  Skin:    General: Skin is warm and dry.  Neurological:     General: No focal deficit present.     Mental Status: She is alert.  Psychiatric:        Mood and Affect: Mood normal.        Behavior: Behavior normal.      The results of significant diagnostics from this hospitalization (including imaging, microbiology, ancillary and laboratory) are listed below for reference.   Microbiology: Recent Results (from the  past 240 hour(s))  Resp panel by RT-PCR (RSV, Flu A&B, Covid) Anterior Nasal Swab     Status: None   Collection Time: 03/31/22  5:07 PM   Specimen: Anterior Nasal Swab  Result Value Ref Range Status   SARS Coronavirus 2 by RT PCR NEGATIVE NEGATIVE Final    Comment: (NOTE) SARS-CoV-2 target nucleic acids are NOT DETECTED.  The SARS-CoV-2 RNA is generally detectable in upper respiratory specimens during the acute phase of infection. The lowest concentration of SARS-CoV-2 viral copies this assay can detect is 138 copies/mL. A negative result does not preclude SARS-Cov-2 infection and should not be used as the sole basis for treatment or other patient management decisions. A negative result may occur with  improper specimen collection/handling, submission of specimen other than nasopharyngeal swab, presence of viral mutation(s) within the areas targeted by this assay, and inadequate number of viral copies(<138 copies/mL). A negative result must be combined with clinical observations, patient history, and epidemiological information. The expected result is Negative.  Fact Sheet for Patients:  EntrepreneurPulse.com.au  Fact Sheet for Healthcare Providers:  IncredibleEmployment.be  This test is no t yet approved or cleared by the [place] FDA and  has been authorized for detection and/or diagnosis of SARS-CoV-2 by FDA under an Emergency Use  Authorization (EUA). This EUA will remain  in effect (meaning this test can be used) for the duration of the COVID-19 declaration under Section 564(b)(1) of the Act, 21 U.S.C.section 360bbb-3(b)(1), unless the authorization is terminated  or revoked sooner.       Influenza A by PCR NEGATIVE NEGATIVE Final   Influenza B by PCR NEGATIVE NEGATIVE Final    Comment: (NOTE) The Xpert Xpress SARS-CoV-2/FLU/RSV plus assay is intended as an aid in the diagnosis of influenza from Nasopharyngeal swab specimens and should not be used as a sole basis for treatment. Nasal washings and aspirates are unacceptable for Xpert Xpress SARS-CoV-2/FLU/RSV testing.  Fact Sheet for Patients: EntrepreneurPulse.com.au  Fact Sheet for Healthcare Providers: IncredibleEmployment.be  This test is not yet approved or cleared by the [place] FDA and has been authorized for detection and/or diagnosis of SARS-CoV-2 by FDA under an Emergency Use Authorization (EUA). This EUA will remain in effect (meaning this test can be used) for the duration of the COVID-19 declaration under Section 564(b)(1) of the Act, 21 U.S.C. section 360bbb-3(b)(1), unless the authorization is terminated or revoked.     Resp Syncytial Virus by PCR NEGATIVE NEGATIVE Final    Comment: (NOTE) Fact Sheet for Patients: EntrepreneurPulse.com.au  Fact Sheet for Healthcare Providers: IncredibleEmployment.be  This test is not yet approved or cleared by the [place] FDA and has been authorized for detection and/or diagnosis of SARS-CoV-2 by FDA under an Emergency Use Authorization (EUA). This EUA will remain in effect (meaning this test can be used) for the duration of the COVID-19 declaration under Section 564(b)(1) of the Act, 21 U.S.C. section 360bbb-3(b)(1), unless the authorization is terminated or revoked.  Performed at [facility], [address],  [place], [place] [address]      Labs: BNP (last 3 results) Recent Labs    03/31/22 1749  BNP 8,110.3*   Basic Metabolic Panel: Recent Labs  Lab 03/31/22 1749 04/01/22 0439  NA 132* 133*  K 4.9 4.6  CL 104 105  CO2 19* 18*  GLUCOSE 135* 138*  BUN 66* 65*  CREATININE 1.62* 1.53*  CALCIUM 9.1 8.9  MG  --  2.0   Liver Function Tests: Recent Labs  Lab 04/01/22 0439  AST 28  ALT 15  ALKPHOS 88  BILITOT 1.2  PROT 6.7  ALBUMIN 3.2*   No results for input(s): "LIPASE", "AMYLASE" in the last 168 hours. No results for input(s): "AMMONIA" in the last 168 hours. CBC: Recent Labs  Lab 03/31/22 1749 04/01/22 0439  WBC 5.9 5.1  NEUTROABS  --  3.7  HGB 10.8* 10.1*  HCT 35.6* 32.3*  MCV 80.7 79.6*  PLT 172 143*   Cardiac Enzymes: No results for input(s): "CKTOTAL", "CKMB", "CKMBINDEX", "TROPONINI" in the last 168 hours. BNP: Invalid input(s): "POCBNP" CBG: Recent Labs  Lab 04/01/22 0118 04/01/22 0726 04/01/22 1142  GLUCAP 147* 133* 200*   D-Dimer No results for input(s): "DDIMER" in the last 72 hours. Hgb A1c No results for input(s): "HGBA1C" in the last 72 hours. Lipid Profile No results for input(s): "CHOL", "HDL", "LDLCALC", "TRIG", "CHOLHDL", "LDLDIRECT" in the last 72 hours. Thyroid function studies No results for input(s): "TSH", "T4TOTAL", "T3FREE", "THYROIDAB" in the last 72 hours.  Invalid input(s): "FREET3" Anemia work up No results for input(s): "VITAMINB12", "FOLATE", "FERRITIN", "TIBC", "IRON", "RETICCTPCT" in the last 72 hours. Urinalysis    Component Value Date/Time   COLORURINE AMBER (A) 03/31/2022 2244   APPEARANCEUR CLOUDY (A) 03/31/2022 2244   LABSPEC 1.022 03/31/2022 2244   PHURINE 7.0 03/31/2022 2244   GLUCOSEU NEGATIVE 03/31/2022 2244   HGBUR SMALL (A) 03/31/2022 2244   BILIRUBINUR NEGATIVE 03/31/2022 2244   KETONESUR 5 (A) 03/31/2022 2244   PROTEINUR >=300 (A) 03/31/2022 2244   UROBILINOGEN 0.2 12/19/2008 2303   NITRITE NEGATIVE  03/31/2022 2244   LEUKOCYTESUR TRACE (A) 03/31/2022 2244   Sepsis Labs Recent Labs  Lab 03/31/22 1749 04/01/22 0439  WBC 5.9 5.1   Microbiology Recent Results (from the past 240 hour(s))  Resp panel by RT-PCR (RSV, Flu A&B, Covid) Anterior Nasal Swab     Status: None   Collection Time: 03/31/22  5:07 PM   Specimen: Anterior Nasal Swab  Result Value Ref Range Status   SARS Coronavirus 2 by RT PCR NEGATIVE NEGATIVE Final    Comment: (NOTE) SARS-CoV-2 target nucleic acids are NOT DETECTED.  The SARS-CoV-2 RNA is generally detectable in upper respiratory specimens during the acute phase of infection. The lowest concentration of SARS-CoV-2 viral copies this assay can detect is 138 copies/mL. A negative result does not preclude SARS-Cov-2 infection and should not be used as the sole basis for treatment or other patient management decisions. A negative result may occur with  improper specimen collection/handling, submission of specimen other than nasopharyngeal swab, presence of viral mutation(s) within the areas targeted by this assay, and inadequate number of viral copies(<138 copies/mL). A negative result must be combined with clinical observations, patient history, and epidemiological information. The expected result is Negative.  Fact Sheet for Patients:  EntrepreneurPulse.com.au  Fact Sheet for Healthcare Providers:  IncredibleEmployment.be  This test is no t yet approved or cleared by the [place] FDA and  has been authorized for detection and/or diagnosis of SARS-CoV-2 by FDA under an Emergency Use Authorization (EUA). This EUA will remain  in effect (meaning this test can be used) for the duration of the COVID-19 declaration under Section 564(b)(1) of the Act, 21 U.S.C.section 360bbb-3(b)(1), unless the authorization is terminated  or revoked sooner.       Influenza A by PCR NEGATIVE NEGATIVE Final   Influenza B by PCR  NEGATIVE NEGATIVE Final    Comment: (NOTE) The Xpert Xpress SARS-CoV-2/FLU/RSV plus assay is  intended as an aid in the diagnosis of influenza from Nasopharyngeal swab specimens and should not be used as a sole basis for treatment. Nasal washings and aspirates are unacceptable for Xpert Xpress SARS-CoV-2/FLU/RSV testing.  Fact Sheet for Patients: EntrepreneurPulse.com.au  Fact Sheet for Healthcare Providers: IncredibleEmployment.be  This test is not yet approved or cleared by the [place] FDA and has been authorized for detection and/or diagnosis of SARS-CoV-2 by FDA under an Emergency Use Authorization (EUA). This EUA will remain in effect (meaning this test can be used) for the duration of the COVID-19 declaration under Section 564(b)(1) of the Act, 21 U.S.C. section 360bbb-3(b)(1), unless the authorization is terminated or revoked.     Resp Syncytial Virus by PCR NEGATIVE NEGATIVE Final    Comment: (NOTE) Fact Sheet for Patients: EntrepreneurPulse.com.au  Fact Sheet for Healthcare Providers: IncredibleEmployment.be  This test is not yet approved or cleared by the [place] FDA and has been authorized for detection and/or diagnosis of SARS-CoV-2 by FDA under an Emergency Use Authorization (EUA). This EUA will remain in effect (meaning this test can be used) for the duration of the COVID-19 declaration under Section 564(b)(1) of the Act, 21 U.S.C. section 360bbb-3(b)(1), unless the authorization is terminated or revoked.  Performed at [facility], [address] [place], [place] [address]     Procedures/Studies: DG Pelvis 1-2 Views  Result Date: 03/31/2022 CLINICAL DATA:  Fall EXAM: PELVIS - 1-2 VIEW COMPARISON:  CT 09/28/2021 FINDINGS: Remote fracture deformities of the a iliac wings, sacral ala a, and parasymphyseal pubic rami bilaterally with resultant diastasis of the pubic symphysis and  hyperostotic changes are again identified and are unchanged. No acute fracture or dislocation. Vascular calcifications are noted. IMPRESSION: 1. No acute fracture or dislocation. 2. Remote fracture deformities of the pelvis as described above. Electronically Signed   By: Fidela Salisbury M.D.   On: 03/31/2022 18:16   DG Lumbar Spine Complete  Result Date: 03/31/2022 CLINICAL DATA:  Fall EXAM: LUMBAR SPINE - COMPLETE 4+ VIEW COMPARISON:  None Available. FINDINGS: Mild lumbar dextrocurvature, apex right at L1. No acute fracture or listhesis of the lumbar spine. Stable remote compression deformities of T12 and L1. Intervertebral disc space narrowing and endplate remodeling is seen throughout the lumbar spine in keeping with changes of advanced degenerative disc disease. Facet arthrosis is seen throughout the lumbar spine, not well profiled on this examination. No remote bilateral sacral alar fractures are identified. Apparent lucency involving the distal sacrum relates to hypertrophic changes involving the marked cranial sacrum and sacral ala bilaterally appears stable since prior CT examination IMPRESSION: 1. No acute fracture or listhesis. 2. Remote compression deformities of T12 and L1. 3. Advanced degenerative disc and degenerative joint disease. Electronically Signed   By: Fidela Salisbury M.D.   On: 03/31/2022 18:15   DG Chest Port 1 View  Result Date: 03/31/2022 CLINICAL DATA:  Cough and wheezing EXAM: PORTABLE CHEST 1 VIEW COMPARISON:  Chest x-ray dated Aug 26, 2020 FINDINGS: Unchanged cardiomegaly status post median sternotomy and CABG. Mild bilateral interstitial opacities, similar to prior exam. No focal consolidation. No large pleural effusion or evidence of pneumothorax IMPRESSION: Mild bilateral interstitial opacities, similar to prior exam, likely due to mild pulmonary edema. Electronically Signed   By: Yetta Glassman M.D.   On: 03/31/2022 17:32     Time coordinating discharge: Over 30  minutes    Dwyane Dee, MD  [facility] 04/01/2022, 11:52 AM

## 2022-04-01 NOTE — Progress Notes (Signed)
*  PRELIMINARY RESULTS* Echocardiogram 2D Echocardiogram has been performed.  [name] 04/01/2022, 11:04 AM

## 2022-04-01 NOTE — Progress Notes (Signed)
Nsg Discharge Note  Admit Date:  03/31/2022 Discharge date: 04/01/2022   [name] to be D/C'd Home per MD order.  AVS completed.  Copy for chart, and copy for patient signed, and dated. Patient/caregiver able to verbalize understanding.  Discharge Medication: Allergies as of 04/01/2022       Reactions   Statins Other (See Comments)   No energy, fatigue.         Medication List     TAKE these medications    calcium carbonate 1250 (500 Ca) MG chewable tablet Commonly known as: OS-CAL Chew by mouth.   Cholecalciferol 10 MCG (400 UNIT) Caps Take by mouth.   denosumab 60 MG/ML Sosy injection Commonly known as: PROLIA Inject 60 mg into the skin every 6 (six) months.   Eliquis 2.5 MG Tabs tablet Generic drug: apixaban Take 2.5 mg by mouth 2 (two) times daily.   feeding supplement Liqd Take 237 mLs by mouth 2 (two) times daily between meals.   Fish Oil 1200 MG Caps Take 1,000 mg by mouth 2 (two) times daily.   furosemide 20 MG tablet Commonly known as: LASIX Take 40 mg by mouth daily.   glipiZIDE 5 MG tablet Commonly known as: GLUCOTROL Take 5 mg by mouth daily.   levothyroxine 100 MCG tablet Commonly known as: SYNTHROID Take 100 mcg by mouth every morning. What changed: Another medication with the same name was removed. Continue taking this medication, and follow the directions you see here.   magnesium oxide 400 MG tablet Commonly known as: MAG-OX Take 400 mg by mouth daily.   metFORMIN 500 MG tablet Commonly known as: GLUCOPHAGE Take 500 mg by mouth every evening.   metoprolol succinate 50 MG 24 hr tablet Commonly known as: TOPROL-XL TAKE 1 AND 1/2 TABLETS BY MOUTH TWICE DAILY What changed: when to take this   multivitamin tablet Take 1 tablet by mouth daily.   MYRBETRIQ PO Take 50 mg by mouth daily. Patient received samples from Dr. Joselyn Arrow office. Does not know the strength at this time.   OneTouch Verio Reflect w/Device Kit See  admin instructions.   OneTouch Verio test strip Generic drug: glucose blood SMARTSIG:Via Meter 3 Times a Week   OSCAL 500 D-3 PO Take 1,200 mg by mouth in the morning and at bedtime.   traMADol 50 MG tablet Commonly known as: ULTRAM Take 50 mg by mouth 2 (two) times daily.   TYLENOL PM EXTRA STRENGTH PO Take 1 tablet by mouth at bedtime as needed (sleep).   VITAMIN D PO Take 500 Units by mouth daily.        Discharge Assessment: Vitals:   04/01/22 0016 04/01/22 0024  BP: 117/80   Pulse: 69   Resp: (!) 24   Temp: 97.6 F (36.4 C)   SpO2: 100% 100%   Skin clean, dry and intact without evidence of skin break down, no evidence of skin tears noted. IV catheter discontinued intact. Site without signs and symptoms of complications - no redness or edema noted at insertion site, patient denies c/o pain - only slight tenderness at site.  Dressing with slight pressure applied.  D/c Instructions-Education: Discharge instructions given to patient/family with verbalized understanding. D/c education completed with patient/family including follow up instructions, medication list, d/c activities limitations if indicated, with other d/c instructions as indicated by MD - patient able to verbalize understanding, all questions fully answered. Patient instructed to return to ED, call 911, or call MD for any changes in condition.  Patient escorted via [place], and D/C home via private auto.  Clovis Fredrickson, LPN 03/54/6568 1:27 PM

## 2022-04-01 NOTE — Plan of Care (Signed)

## 2022-04-05 IMAGING — US US ABDOMEN LIMITED
1 series · 14 of 25 positions shown · non-contrast
Comparison: None.

CLINICAL DATA: Elevated LFTs.  Previous cholecystectomy.

EXAM:
ULTRASOUND ABDOMEN LIMITED RIGHT UPPER QUADRANT

[Series 1: us abdomen limited · 0.21mm/px · 14 of 75 slices shown]
[im 1/75]
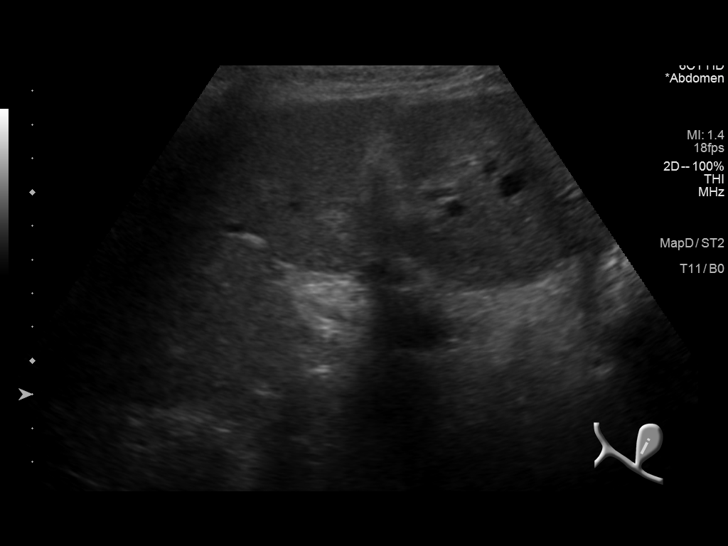
[im 7/75]
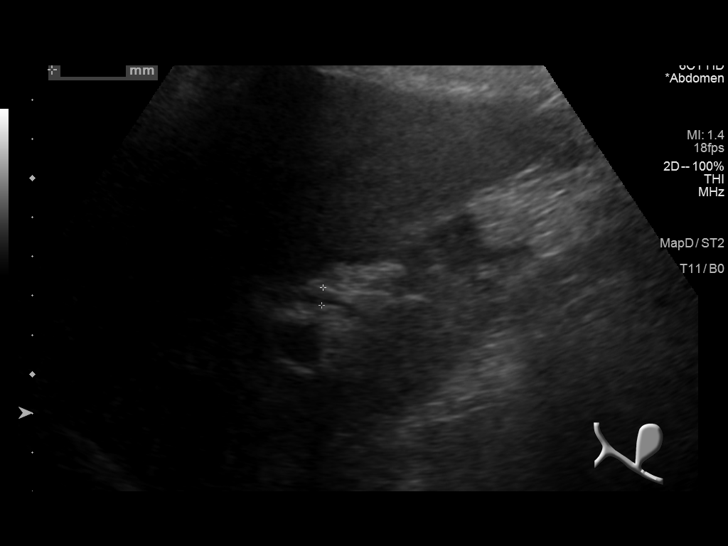
[im 13/75]
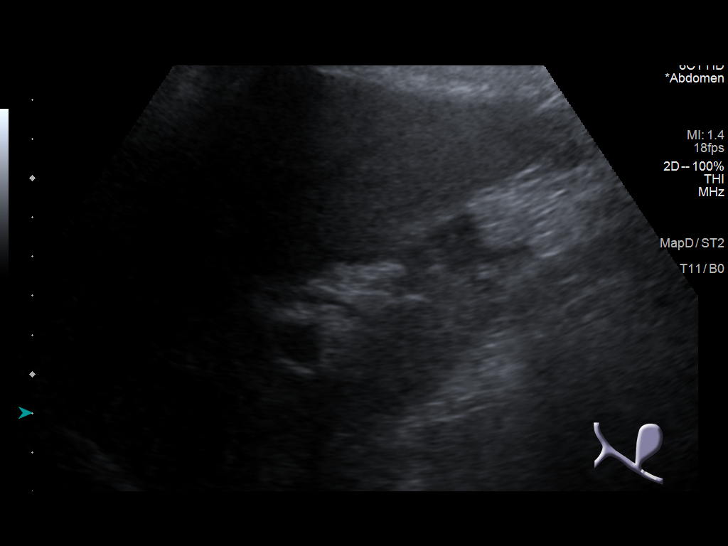
[im 19/75]
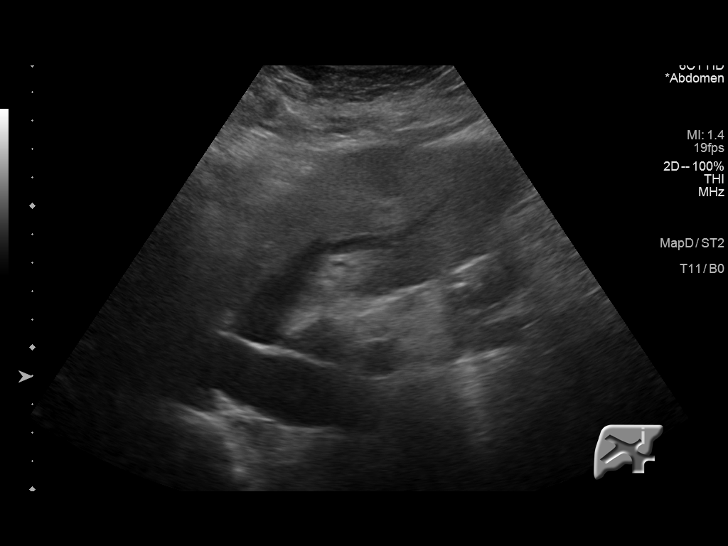
[im 25/75]
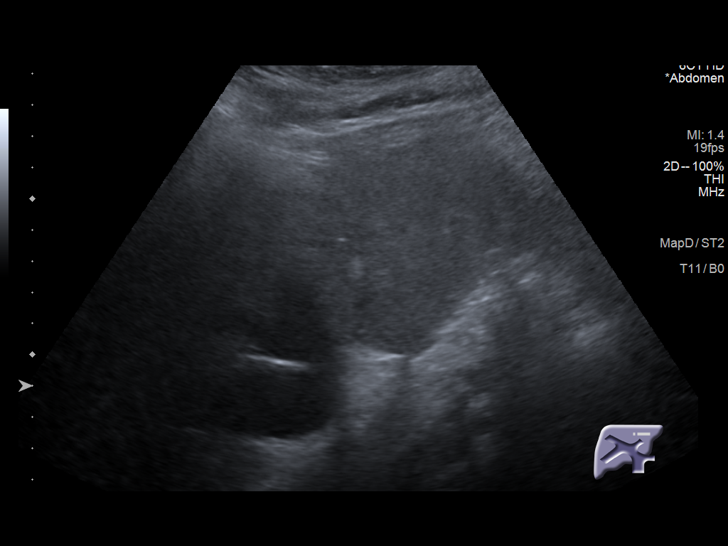
[im 28/75]
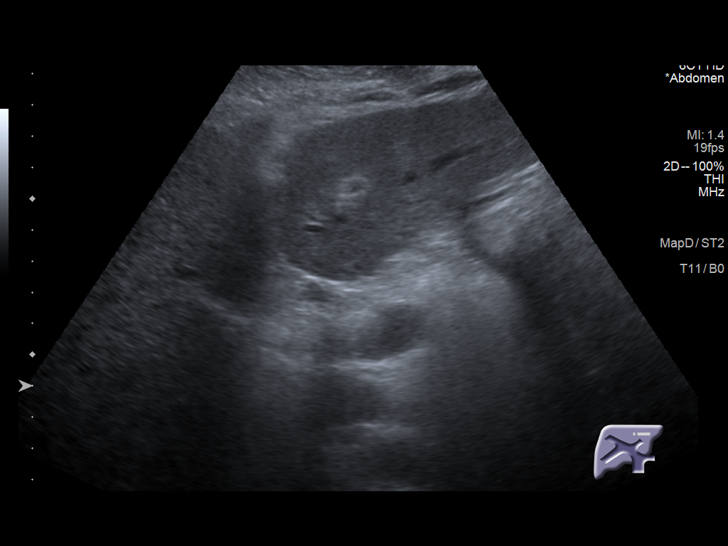
[im 34/75]
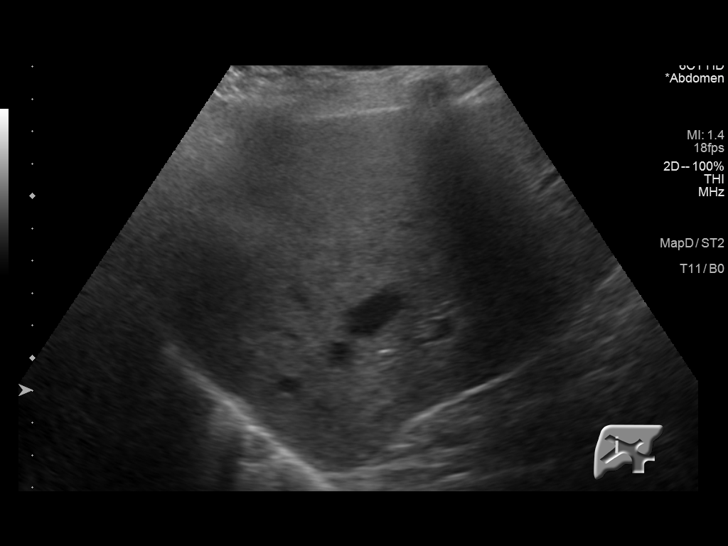
[im 41/75]
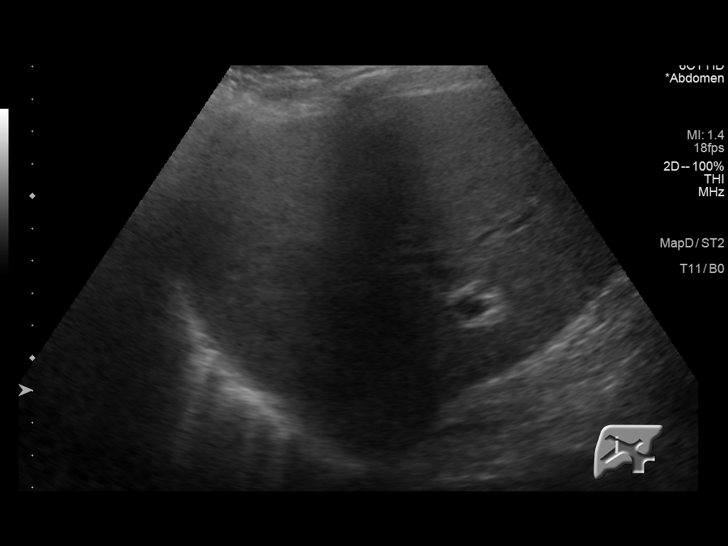
[im 47/75]
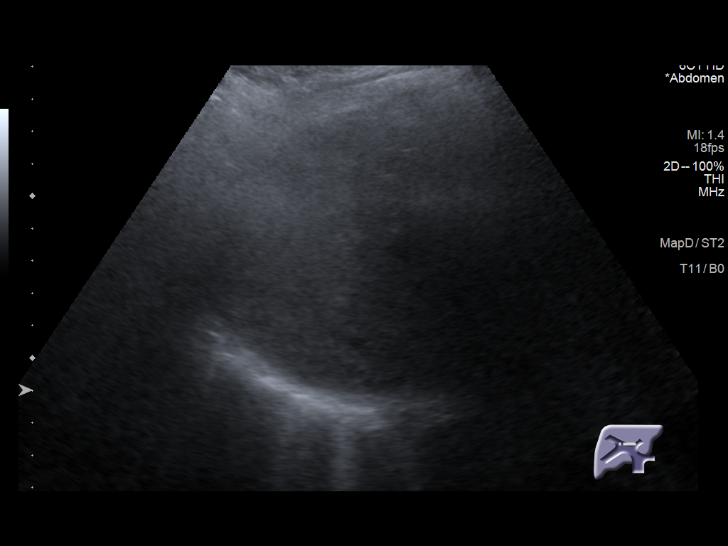
[im 50/75]
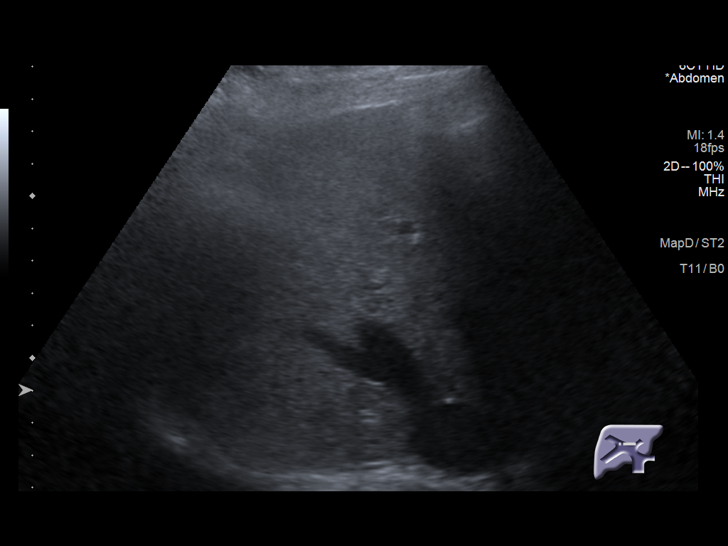
[im 56/75]
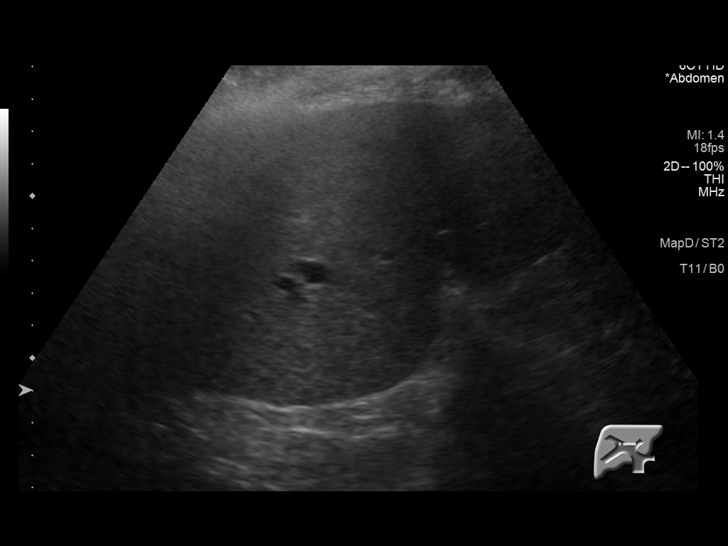
[im 62/75]
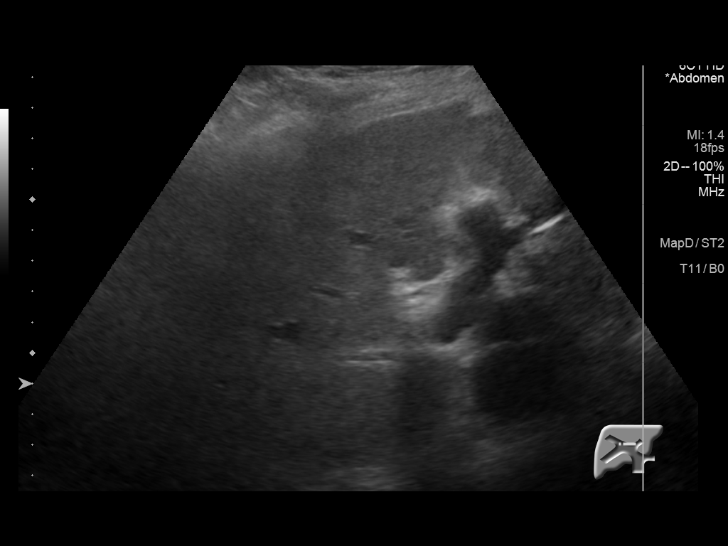
[im 68/75]
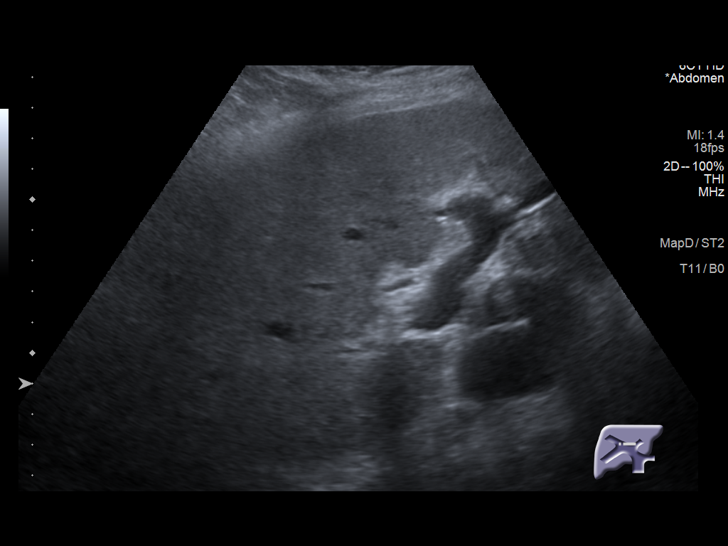
[im 75/75]
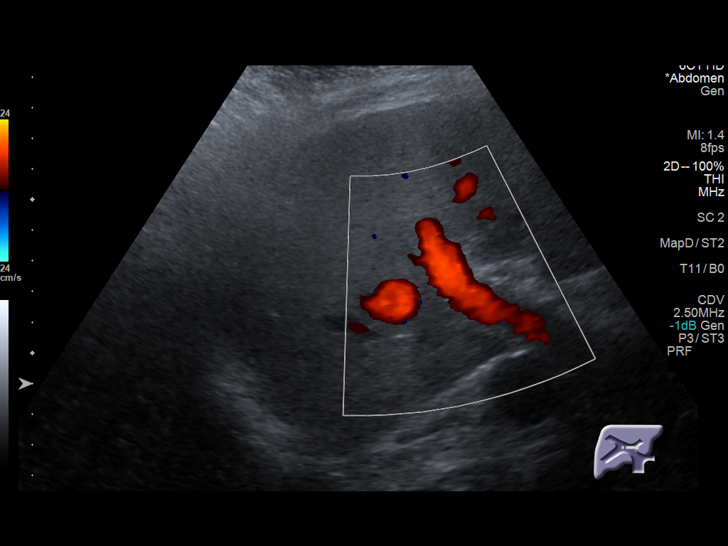

[14 of 25 positions shown; findings below may reference images not displayed]

FINDINGS: Gallbladder:

Prior cholecystectomy

Common bile duct:

Diameter: 4.6 mm.

Liver:

No focal lesion identified. Within normal limits in parenchymal
echogenicity. Mild coarse increased echogenicity compatible with a
degree of steatosis without focal mass.

Other: None.
IMPRESSION: 1.  No acute findings.

2.  Previous cholecystectomy.

3.  Suggestion of a degree of hepatic steatosis without focal mass.

## 2022-04-05 IMAGING — CT CT CHEST W/O CM
2 of 4 series · 15 of 36 positions shown, 18 images · non-contrast
Comparison: June 24, 2019

CLINICAL DATA: Cough and shortness of breath. History of
endometrial carcinoma

EXAM:
CT CHEST WITHOUT CONTRAST
TECHNIQUE: Multidetector CT imaging of the chest was performed following the
standard protocol without IV contrast.

[Series 2: routine chest without · axial · non-contrast · 0.64mm/px · z∈[+940,+1196]mm · 12 of 152 slices shown, 15 images]
[im 12/152  mediastinal]
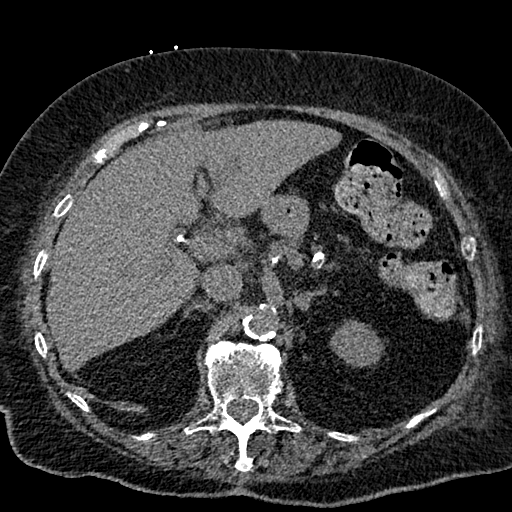
[im 12/152  lung]
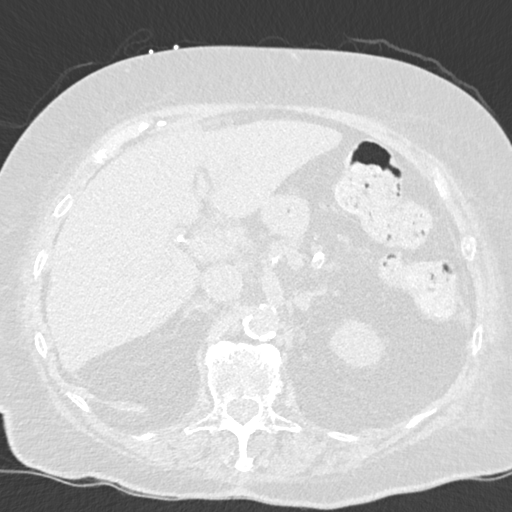
[im 24/152  lung]
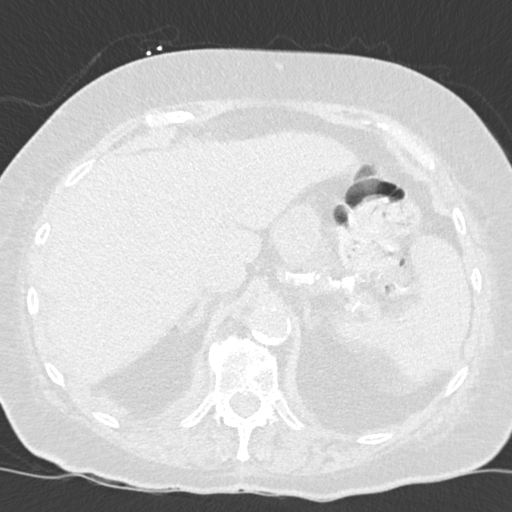
[im 35/152  lung]
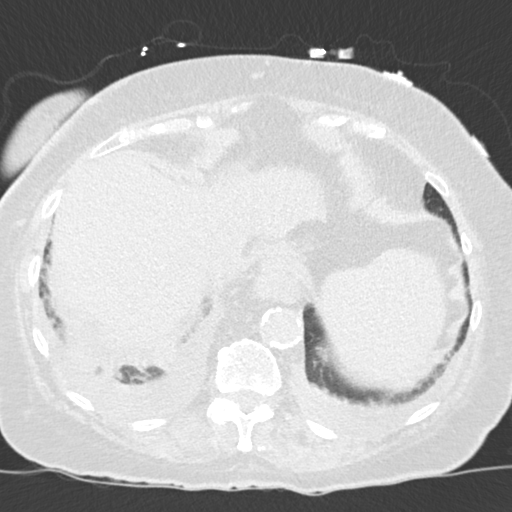
[im 47/152  lung]
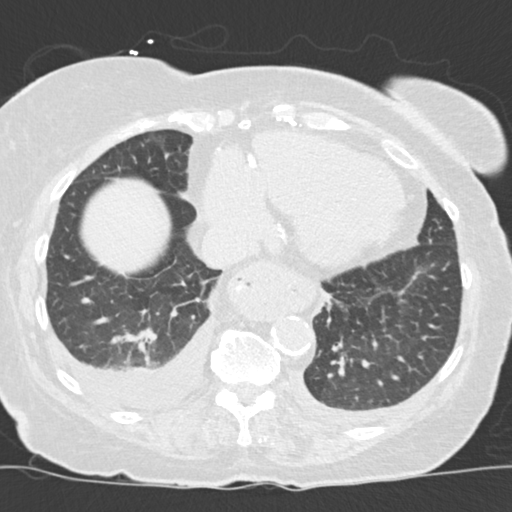
[im 59/152  mediastinal]
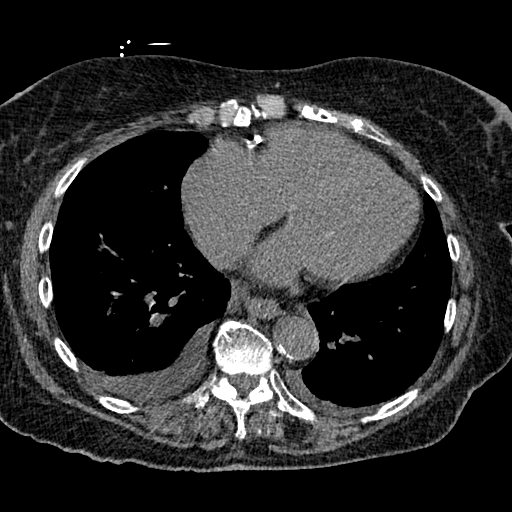
[im 59/152  lung]
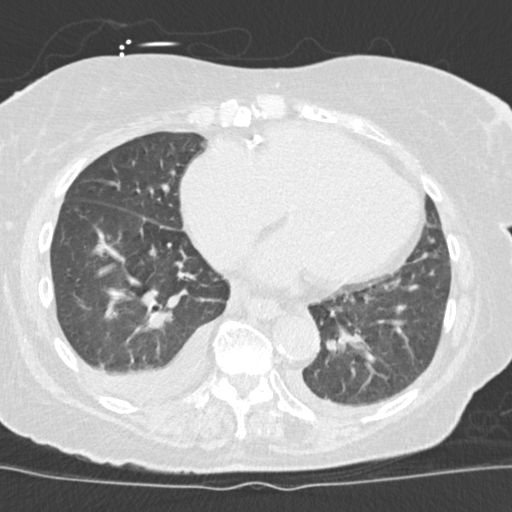
[im 70/152  lung]
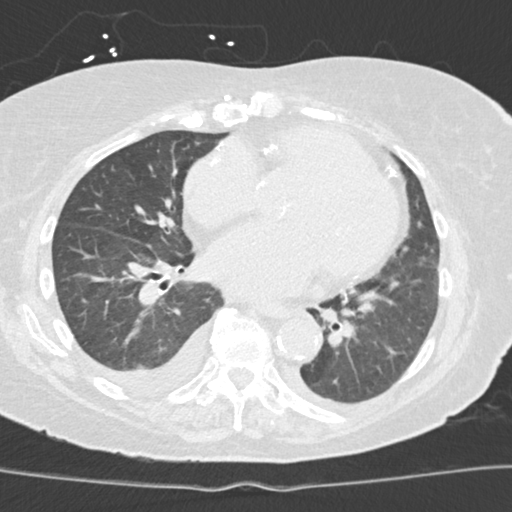
[im 82/152  lung]
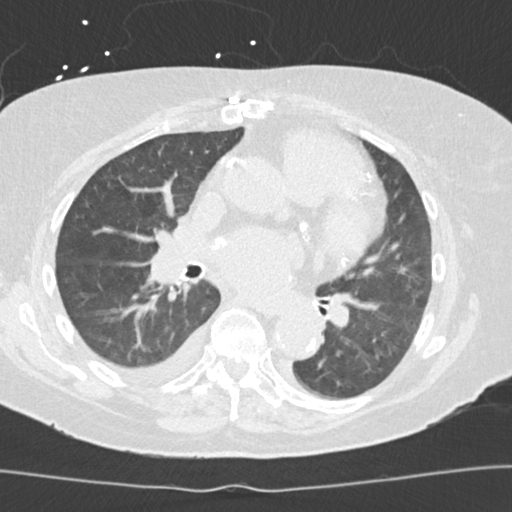
[im 93/152  lung]
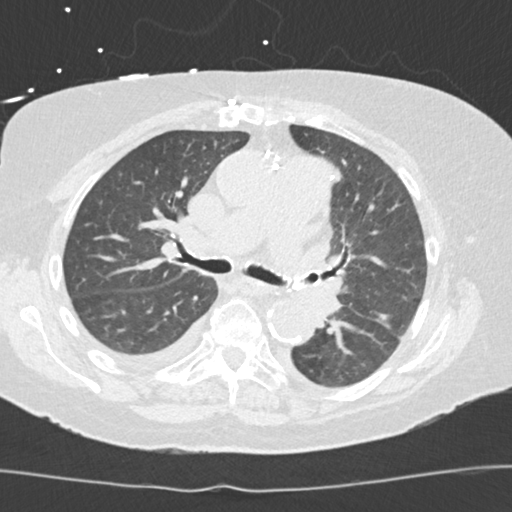
[im 105/152  mediastinal]
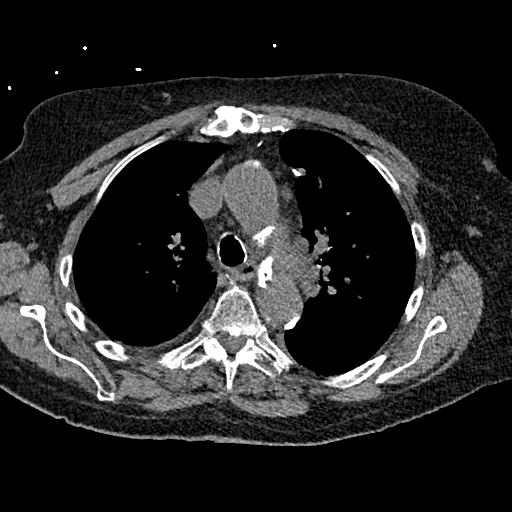
[im 105/152  lung]
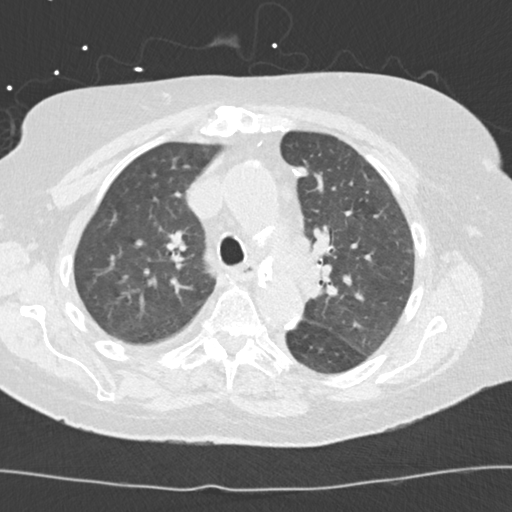
[im 117/152  lung]
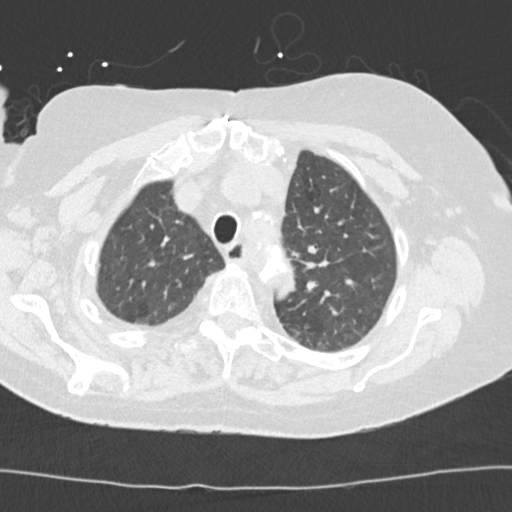
[im 128/152  lung]
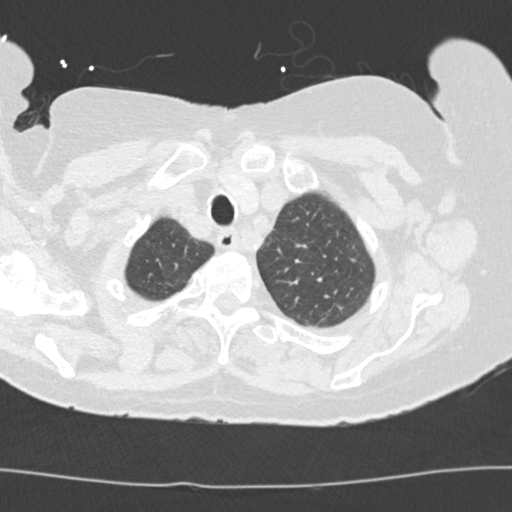
[im 140/152  lung]
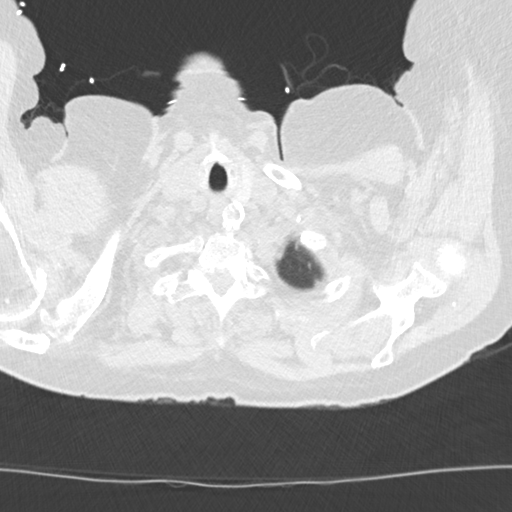

[Series 5: coronal · coronal · 0.62mm/px · 3 of 151 slices shown]
[im 31/151  lung]
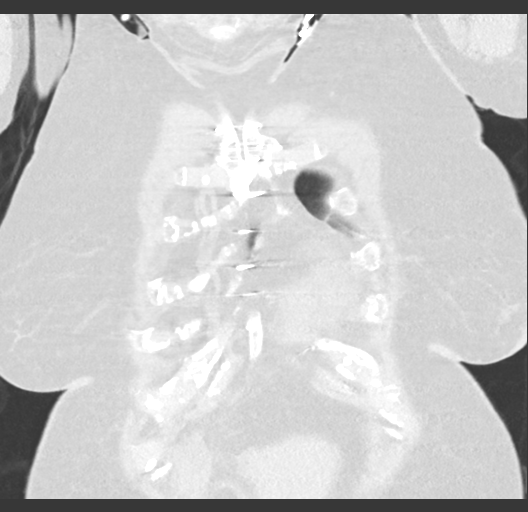
[im 61/151  lung]
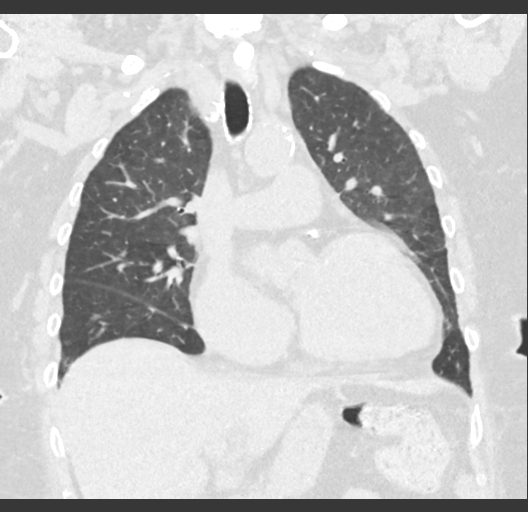
[im 91/151  lung]
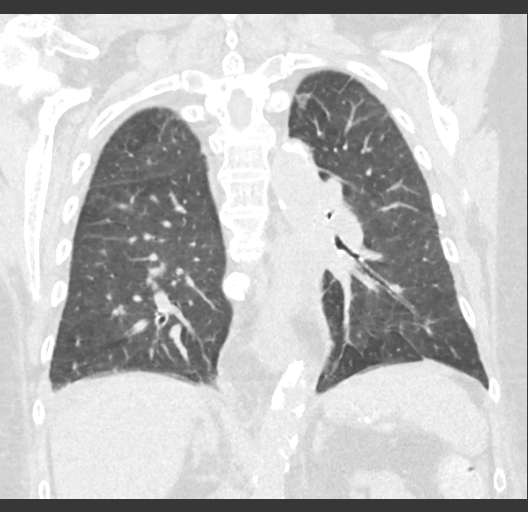

[15 of 36 positions shown; findings below may reference images not displayed]

FINDINGS: Cardiovascular: There is no evident thoracic aortic aneurysm. There
are foci of calcification at multiple sites in visualized great
vessels. Note that the right innominate and left common carotid
arteries arise as a common trunk, an anatomic variant. There is
aortic atherosclerosis as well as multiple foci of native coronary
artery calcification. There is no appreciable pericardial effusion
or pericardial thickening. Patient is status post median sternotomy
with prior coronary artery bypass grafting.

Mediastinum/Nodes: The right lobe of the thyroid is absent. There
are nodular opacities throughout the left lobe of the thyroid,
largest measuring 1.5 x 1.1 cm. There are scattered subcentimeter
mediastinal lymph nodes. No adenopathy is appreciable by size
criteria.

There is a moderate sized hiatal hernia.

Lungs/Pleura: There are free-flowing pleural effusions bilaterally,
larger on the right than the left, with apparent compressive
atelectasis in each lung base, somewhat more on the right than on
the left. The lungs elsewhere are clear.

Upper Abdomen: Gallbladder is absent. There is aortic and major
mesenteric arterial vascular calcification. Visualized upper
abdominal structures otherwise appear unremarkable on this
noncontrast enhanced study.

Musculoskeletal: Status post median sternotomy. Degenerative change
noted in the thoracic spine. Anterior wedging of the T12 vertebral
body is noted. No blastic or lytic bone lesions are evident. No
chest wall lesions.
IMPRESSION: 1. Free-flowing pleural effusions, larger on the right than on the
left, with apparent compressive atelectasis in the lung bases. Lungs
otherwise clear.

2.  No evident thoracic adenopathy.

3. Apparent absence of the right lobe of the thyroid. Mass lesions
in left lobe, largest measuring 1.5 cm. In the setting of
significant comorbidities or limited life expectancy, no follow-up
recommended. Clinical assessment in this specific case regarding
additional surveillance with respect to clinical history advised.
(Ref: [HOSPITAL]. [DATE]): 143-50).

4.  Moderate hiatal hernia noted.

5. Aortic atherosclerosis. Foci of great vessel calcification noted.
Status post coronary artery bypass grafting. Native coronary artery
calcification noted at multiple sites.

Aortic Atherosclerosis ([id number]).

## 2022-04-07 ENCOUNTER — Encounter (HOSPITAL_COMMUNITY): Payer: Self-pay

## 2022-04-07 ENCOUNTER — Inpatient Hospital Stay (HOSPITAL_COMMUNITY): Admit: 2022-04-07 | Payer: Medicare Other | Admitting: Internal Medicine

## 2022-04-07 DIAGNOSIS — I5042 Chronic combined systolic (congestive) and diastolic (congestive) heart failure: Secondary | ICD-10-CM | POA: Diagnosis not present

## 2022-04-07 DIAGNOSIS — R7989 Other specified abnormal findings of blood chemistry: Secondary | ICD-10-CM | POA: Diagnosis not present

## 2022-04-07 DIAGNOSIS — I10 Essential (primary) hypertension: Secondary | ICD-10-CM | POA: Diagnosis not present

## 2022-04-07 DIAGNOSIS — I6381 Other cerebral infarction due to occlusion or stenosis of small artery: Secondary | ICD-10-CM | POA: Diagnosis not present

## 2022-04-07 DIAGNOSIS — R0602 Shortness of breath: Secondary | ICD-10-CM | POA: Diagnosis not present

## 2022-04-07 DIAGNOSIS — L03311 Cellulitis of abdominal wall: Secondary | ICD-10-CM | POA: Diagnosis not present

## 2022-04-07 DIAGNOSIS — R4182 Altered mental status, unspecified: Secondary | ICD-10-CM | POA: Diagnosis not present

## 2022-04-07 DIAGNOSIS — N179 Acute kidney failure, unspecified: Secondary | ICD-10-CM | POA: Diagnosis not present

## 2022-04-08 ENCOUNTER — Telehealth (HOSPITAL_BASED_OUTPATIENT_CLINIC_OR_DEPARTMENT_OTHER): Payer: Self-pay | Admitting: *Deleted

## 2022-05-08 DEATH — deceased

## 2022-06-27 ENCOUNTER — Encounter (INDEPENDENT_AMBULATORY_CARE_PROVIDER_SITE_OTHER): Payer: Medicare Other | Admitting: Ophthalmology

## 2022-07-17 ENCOUNTER — Ambulatory Visit: Payer: Medicare Other | Admitting: Cardiology
